# Patient Record
Sex: Female | Born: 1985 | Race: White | Hispanic: No | Marital: Single | Smoking: Former smoker
Health system: Southern US, Academic
[De-identification: ages and names within clinical notes are randomized; demographics above are authoritative.]

## PROBLEM LIST (undated history)

## (undated) ENCOUNTER — Inpatient Hospital Stay: Payer: Self-pay

## (undated) ENCOUNTER — Encounter

## (undated) ENCOUNTER — Telehealth

## (undated) ENCOUNTER — Encounter: Attending: Internal Medicine | Primary: Internal Medicine

## (undated) ENCOUNTER — Ambulatory Visit

## (undated) ENCOUNTER — Ambulatory Visit: Payer: MEDICAID

## (undated) ENCOUNTER — Ambulatory Visit: Attending: Addiction (Substance Use Disorder) | Primary: Addiction (Substance Use Disorder)

## (undated) ENCOUNTER — Telehealth
Attending: Student in an Organized Health Care Education/Training Program | Primary: Student in an Organized Health Care Education/Training Program

## (undated) ENCOUNTER — Ambulatory Visit: Payer: MEDICAID | Attending: Clinical | Primary: Clinical

## (undated) ENCOUNTER — Ambulatory Visit: Payer: MEDICAID | Attending: Internal Medicine | Primary: Internal Medicine

## (undated) ENCOUNTER — Ambulatory Visit: Attending: Clinical | Primary: Clinical

## (undated) DIAGNOSIS — F319 Bipolar disorder, unspecified: Secondary | ICD-10-CM

## (undated) DIAGNOSIS — R87612 Low grade squamous intraepithelial lesion on cytologic smear of cervix (LGSIL): Secondary | ICD-10-CM

## (undated) DIAGNOSIS — F329 Major depressive disorder, single episode, unspecified: Secondary | ICD-10-CM

## (undated) DIAGNOSIS — M419 Scoliosis, unspecified: Secondary | ICD-10-CM

## (undated) DIAGNOSIS — F32A Depression, unspecified: Secondary | ICD-10-CM

## (undated) DIAGNOSIS — F1290 Cannabis use, unspecified, uncomplicated: Secondary | ICD-10-CM

## (undated) DIAGNOSIS — Z8759 Personal history of other complications of pregnancy, childbirth and the puerperium: Secondary | ICD-10-CM

## (undated) DIAGNOSIS — F419 Anxiety disorder, unspecified: Secondary | ICD-10-CM

## (undated) DIAGNOSIS — F17200 Nicotine dependence, unspecified, uncomplicated: Secondary | ICD-10-CM

## (undated) DIAGNOSIS — F1120 Opioid dependence, uncomplicated: Secondary | ICD-10-CM

## (undated) DIAGNOSIS — O0930 Supervision of pregnancy with insufficient antenatal care, unspecified trimester: Secondary | ICD-10-CM

## (undated) HISTORY — DX: Anxiety disorder, unspecified: F41.9

## (undated) NOTE — ED Notes (Signed)
Formatting of this note might be different from the original.  VOL/pending reassessment in the AM  Electronically signed by Haywood Lasso, NT at 12/28/2021  4:45 AM EST

## (undated) NOTE — ED Notes (Signed)
Formatting of this note might be different from the original.  Pt denies SI/HI and understands that the resources listed in her discharge papers are available to help her if needed.  Electronically signed by Cherlynn Polo, RN at 12/28/2021  6:56 AM EST

## (undated) NOTE — Unmapped External Note (Signed)
Formatting of this note is different from the original.  [facility] Face-to-Face Psychiatry Consult     Reason for Consult:  Psych evaluation   Referring Physician:  Dr. Vicente Males  Patient Identification: Dana Meyers  MRN:  [id number]  Principal Diagnosis: History of substance use  Diagnosis:  Principal Problem:    History of substance use    Total Time spent with patient: 45 minutes    Subjective:    " Ive been feeling bad ever since I began taking the Unisom again"    HPI:  Dana Meyers, 81 y.o., female patient seen by this provider; chart reviewed and consulted with Dr. Vicente Males on 12/28/21.  On evaluation Dana Meyers reports  that she has recently stopped taking her oxcabamazapine due to running out of the medication.  She states her provider changed the dosage and that medicaid would not fill it.  She says as a result, she has been finding it difficult to sleep.  Upon further assessing it has been said by the patient that she takes more than what is prescribed and has run out of it.  She also admits to abusing Unisom.  Dana Meyers is an over the counter sleep aid.  The directed amount to take is 1 tablet 30 minutes before bedtime.  She admits to taking 15 tablets daily.  Excess Unisom can lead to seizure, hallucinations, and restlessness.  She also take 8mg  suboxone strips 2x daily.  Patient has an appt with her provider 11/8 and will discuss medication adjustments.      Dana Meyers is anxious, jittery; she is alert/oriented x 4; anxious/cooperative; and mood congruent with affect.  Patient is speaking in a clear tone at moderate volume, and fast pace; with poor eye contact.  Her thought process is coherent and relevant; There is no indication that she is currently responding to internal/external stimuli or experiencing delusional thought content.  Patient denies suicidal/self-harm/homicidal ideation, psychosis, and paranoia.      Past Psychiatric History: substance abuse     Risk to Self:    Risk to Others:    Prior  Inpatient Therapy:    Prior Outpatient Therapy:      Past Medical History:   Past Medical History:   Diagnosis Date    Current smoker     Depression     does not want anyone incl  FOB to know    History of preterm premature rupture of membranes (PPROM)     G2    Low grade squamous intraepithelial lesion (LGSIL) on cervical Pap smear 2017    Marijuana use     Methadone maintenance treatment affecting pregnancy (HCC)     Scoliosis      Past Surgical History:   Procedure Laterality Date    CESAREAN SECTION  09/10/2009    FTP/FITL, meconium    CESAREAN SECTION N/A 05/22/2015    Procedure: CESAREAN SECTION;  Surgeon: Conard Novak, MD;  Location: [facility] ORS;  Service: Obstetrics;  Laterality: N/A;    CESAREAN SECTION N/A 11/29/2018    Procedure: CESAREAN SECTION;  Surgeon: Conard Novak, MD;  Location: [facility] ORS;  Service: Obstetrics;  Laterality: N/A;     Family History:   Family History   Problem Relation Age of Onset    Diabetes Father     Hypertension Father     Heart attack Maternal Grandfather     Cancer Paternal Grandmother     Diabetes Nephew     Heart attack Maternal  Aunt     Heart attack Maternal Uncle      Family Psychiatric  History:   Social History:   Social History     Substance and Sexual Activity   Alcohol Use No    Alcohol/week: 0.0 standard drinks of alcohol     Social History     Substance and Sexual Activity   Drug Use Yes    Types: Marijuana     Social History     Socioeconomic History    Marital status: Single     Spouse name: Not on file    Number of children: Not on file    Years of education: Not on file    Highest education level: Not on file   Occupational History    Not on file   Tobacco Use    Smoking status: Former     Packs/day: 0.50     Types: Cigarettes    Smokeless tobacco: Never   Vaping Use    Vaping Use: Every day   Substance and Sexual Activity    Alcohol use: No     Alcohol/week: 0.0 standard drinks of alcohol    Drug use: Yes     Types: Marijuana    Sexual activity: Yes      Birth control/protection: Implant   Other Topics Concern    Not on file   Social History Narrative    Not on file     Social Determinants of Health     Financial Resource Strain: Not on file   Food Insecurity: Not on file   Transportation Needs: Not on file   Physical Activity: Not on file   Stress: Not on file   Social Connections: Not on file     Additional Social History:      Allergies:  No Known Allergies    Labs:   Results for orders placed or performed during the hospital encounter of 12/28/21 (from the past 48 hour(s))   Acetaminophen level     Status: Abnormal    Collection Time: 12/28/21  1:30 AM   Result Value Ref Range    Acetaminophen (Tylenol), Serum <10 (L) 10 - 30 ug/mL     Comment: (NOTE)  Therapeutic concentrations vary significantly. A range of 10-30 ug/mL   may be an effective concentration for many patients. However, some   are best treated at concentrations outside of this range.  Acetaminophen concentrations >150 ug/mL at 4 hours after ingestion   and >50 ug/mL at 12 hours after ingestion are often associated with   toxic reactions.    Performed at [facility], [address] [place],  [place] [address]    Resp Panel by RT-PCR (Flu A&B, Covid)     Status: None    Collection Time: 12/28/21  1:30 AM   Result Value Ref Range    SARS Coronavirus 2 by RT PCR NEGATIVE NEGATIVE     Comment: (NOTE)  SARS-CoV-2 target nucleic acids are NOT DETECTED.    The SARS-CoV-2 RNA is generally detectable in upper respiratory  specimens during the acute phase of infection. The lowest  concentration of SARS-CoV-2 viral copies this assay can detect is  138 copies/mL. A negative result does not preclude SARS-Cov-2  infection and should not be used as the sole basis for treatment or  other patient management decisions. A negative result may occur with   improper specimen collection/handling, submission of specimen other  than nasopharyngeal swab, presence of viral mutation(s) within the  areas targeted by  this assay, and inadequate number of viral  copies(<138 copies/mL). A negative result must be combined with  clinical observations, patient history, and epidemiological  information. The expected result is Negative.    Fact Sheet for Patients:   BloggerCourse.com    Fact Sheet for Healthcare Providers:   SeriousBroker.it    This test is no t yet approved or cleared by the [place] FDA and   has been authorized for detection and/or diagnosis of SARS-CoV-2 by  FDA under an Emergency Use Authorization (EUA). This EUA will remain   in effect (meaning this test can be used) for the duration of the  COVID-19 declaration under Section 564(b)(1) of the Act, 21  U.S.C.section 360bbb-3(b)(1), unless the authorization is terminated   or revoked sooner.        Influenza A by PCR NEGATIVE NEGATIVE    Influenza B by PCR NEGATIVE NEGATIVE     Comment: (NOTE)  The Xpert Xpress SARS-CoV-2/FLU/RSV plus assay is intended as an aid  in the diagnosis of influenza from Nasopharyngeal swab specimens and  should not be used as a sole basis for treatment. Nasal washings and  aspirates are unacceptable for Xpert Xpress SARS-CoV-2/FLU/RSV  testing.    Fact Sheet for Patients:  BloggerCourse.com    Fact Sheet for Healthcare Providers:  SeriousBroker.it    This test is not yet approved or cleared by the [place] FDA and  has been authorized for detection and/or diagnosis of SARS-CoV-2 by  FDA under an Emergency Use Authorization (EUA). This EUA will remain  in effect (meaning this test can be used) for the duration of the  COVID-19 declaration under Section 564(b)(1) of the Act, 21 U.S.C.  section 360bbb-3(b)(1), unless the authorization is terminated or  revoked.    Performed at [facility], [address] [place],  [place] [address]    CBC with Differential/Platelet     Status: None    Collection Time: 12/28/21  1:30 AM    Result Value Ref Range    WBC 6.0 4.0 - 10.5 K/uL    RBC 4.30 3.87 - 5.11 MIL/uL    Hemoglobin 12.2 12.0 - 15.0 g/dL    HCT 65.7 84.6 - 96.2 %    MCV 85.6 80.0 - 100.0 fL    MCH 28.4 26.0 - 34.0 pg    MCHC 33.2 30.0 - 36.0 g/dL    RDW 95.2 84.1 - 32.4 %    Platelets 228 150 - 400 K/uL    nRBC 0.0 0.0 - 0.2 %    Neutrophils Relative % 54 %    Neutro Abs 3.2 1.7 - 7.7 K/uL    Lymphocytes Relative 33 %    Lymphs Abs 2.0 0.7 - 4.0 K/uL    Monocytes Relative 10 %    Monocytes Absolute 0.6 0.1 - 1.0 K/uL    Eosinophils Relative 2 %    Eosinophils Absolute 0.1 0.0 - 0.5 K/uL    Basophils Relative 1 %    Basophils Absolute 0.0 0.0 - 0.1 K/uL    Immature Granulocytes 0 %    Abs Immature Granulocytes 0.02 0.00 - 0.07 K/uL     Comment: Performed at [facility], [address] [place], [place] [address]   Comprehensive metabolic panel     Status: Abnormal    Collection Time: 12/28/21  1:30 AM   Result Value Ref Range    Sodium 139 135 - 145 mmol/L    Potassium  4.0 3.5 - 5.1 mmol/L    Chloride 109 98 - 111 mmol/L    CO2 23 22 - 32 mmol/L    Glucose, Bld 116 (H) 70 - 99 mg/dL     Comment: Glucose reference range applies only to samples taken after fasting for at least 8 hours.    BUN 8 6 - 20 mg/dL    Creatinine, Ser 4.54 0.44 - 1.00 mg/dL    Calcium 9.3 8.9 - 09.8 mg/dL    Total Protein 7.9 6.5 - 8.1 g/dL    Albumin 4.6 3.5 - 5.0 g/dL    AST 32 15 - 41 U/L    ALT 22 0 - 44 U/L    Alkaline Phosphatase 59 38 - 126 U/L    Total Bilirubin 0.3 0.3 - 1.2 mg/dL    GFR, Estimated >11 >91 mL/min     Comment: (NOTE)  Calculated using the CKD-EPI Creatinine Equation (2021)     Anion gap 7 5 - 15     Comment: Performed at [facility], [address] [place], [place] [address]   Salicylate level     Status: Abnormal    Collection Time: 12/28/21  1:30 AM   Result Value Ref Range    Salicylate Lvl <7.0 (L) 7.0 - 30.0 mg/dL     Comment: Performed at [facility], [address], [place], [place] [address]    Urine Drug Screen, Qualitative ([facility] only)     Status: Abnormal    Collection Time: 12/28/21  1:30 AM   Result Value Ref Range    Tricyclic, Ur Screen NONE DETECTED NONE DETECTED    Amphetamines, Ur Screen NONE DETECTED NONE DETECTED    MDMA (Ecstasy)Ur Screen NONE DETECTED NONE DETECTED    Cocaine Metabolite,Ur Sc NONE DETECTED NONE DETECTED    Opiate, Ur Screen NONE DETECTED NONE DETECTED    Phencyclidine (PCP) Ur S NONE DETECTED NONE DETECTED    Cannabinoid 50 Ng, Ur Sc POSITIVE (A) NONE DETECTED    Barbiturates, Ur Screen NONE DETECTED NONE DETECTED    Benzodiazepine, Ur Scrn NONE DETECTED NONE DETECTED    Methadone Scn, Ur NONE DETECTED NONE DETECTED     Comment: (NOTE)  Tricyclics + metabolites, urine    Cutoff 1000 ng/mL  Amphetamines + metabolites, urine  Cutoff 1000 ng/mL  MDMA (Ecstasy), urine              Cutoff 500 ng/mL  Cocaine Metabolite, urine          Cutoff 300 ng/mL  Opiate + metabolites, urine        Cutoff 300 ng/mL  Phencyclidine (PCP), urine         Cutoff 25 ng/mL  Cannabinoid, urine                 Cutoff 50 ng/mL  Barbiturates + metabolites, urine  Cutoff 200 ng/mL  Benzodiazepine, urine              Cutoff 200 ng/mL  Methadone, urine                   Cutoff 300 ng/mL    The urine drug screen provides only a preliminary, unconfirmed  analytical test result and should not be used for non-medical  purposes. Clinical consideration and professional judgment should  be applied to any positive drug screen result due to possible  interfering substances. A more specific alternate chemical method  must be used in order to obtain  a confirmed analytical result.  Gas chromatography / mass spectrometry (GC/MS) is the preferred  confirm atory method.  Performed at [facility], [address] [place],  [place] [address]    Ethanol     Status: None    Collection Time: 12/28/21  1:30 AM   Result Value Ref Range    Alcohol, Ethyl (B) <10 <10 mg/dL     Comment: (NOTE)  Lowest detectable limit  for serum alcohol is 10 mg/dL.    For medical purposes only.  Performed at [facility], [address] [place],  [place] [address]    Pregnancy, urine     Status: None    Collection Time: 12/28/21  1:30 AM   Result Value Ref Range    Preg Test, Ur NEGATIVE NEGATIVE     Comment: Performed at [facility], [address] [place], [place] [address]     No current facility-administered medications for this encounter.     Current Outpatient Medications   Medication Sig Dispense Refill    Buprenorphine HCl-Naloxone HCl (SUBOXONE) 8-2 MG FILM Place 1 Film under the tongue in the morning and at bedtime. 30 each     clotrimazole (MYCELEX) 10 MG troche Take 1 tablet (10 mg total) by mouth 5 (five) times daily. (Patient not taking: Reported on 12/28/2021) 35 tablet 0    cyanocobalamin (VITAMIN B12) 1000 MCG tablet Take 1 tablet by mouth daily. (Patient not taking: Reported on 12/28/2021)      hydrOXYzine (ATARAX) 25 MG tablet Take 25 mg by mouth every 8 (eight) hours as needed.      Oxcarbazepine (TRILEPTAL) 300 MG tablet Take 300 mg by mouth daily. (Patient not taking: Reported on 12/28/2021)       Musculoskeletal:  Strength & Muscle Tone: within normal limits  Gait & Station: normal  Patient leans: N/A    Psychiatric Specialty Exam:    Presentation   General Appearance:   Casual    Eye Contact:  Fair    Speech:  Clear and Coherent    Speech Volume:  Decreased    Handedness:  Right    Mood and Affect   Mood:  Anxious; Irritable; Depressed    Affect:  Appropriate; Depressed    Thought Process   Thought Processes:  Coherent    Descriptions of Associations:Intact    Orientation:Full (Time, Place and Person)    Thought Content:Logical    History of Schizophrenia/Schizoaffective disorder:No data recorded  Duration of Psychotic Symptoms:No data recorded  Hallucinations:Hallucinations: None    Ideas of Reference:None    Suicidal Thoughts:Suicidal Thoughts: No    Homicidal Thoughts:Homicidal Thoughts:  No    Sensorium   Memory:  Immediate Fair    Judgment:  Fair    Insight:  Fair    Art therapist   Concentration:  Fair    Attention Span:  Fair    Recall:  Fair    Fund of Knowledge:  Fair    Language:  Fair    Psychomotor Activity   Psychomotor Activity:Psychomotor Activity: Normal    Assets   Assets:  Communication Skills; Desire for Improvement; Housing; Social Support    Sleep   Sleep:Sleep: Poor    Physical Exam:  Physical Exam  Vitals and nursing note reviewed.   HENT:      Head: Normocephalic and atraumatic.      Nose: Nose normal.      Mouth/Throat:      Mouth: Mucous membranes are dry.  Eyes:      Extraocular Movements: Extraocular movements intact.      Pupils: Pupils are equal, round, and reactive to light.   Pulmonary:      Effort: Pulmonary effort is normal.   Musculoskeletal:         General: Normal range of motion.      Cervical back: Normal range of motion.   Skin:     General: Skin is dry.   Neurological:      Mental Status: She is alert and oriented to person, place, and time.   Psychiatric:         Attention and Perception: Attention and perception normal.         Mood and Affect: Mood is anxious and depressed.         Speech: Speech normal.         Behavior: Behavior is cooperative.         Thought Content: Thought content does not include suicidal plan.         Cognition and Memory: Cognition and memory normal.         Judgment: Judgment is impulsive.     ROS  unknown if currently breastfeeding. There is no height or weight on file to calculate BMI.    Disposition: Patient does not meet criteria for psychiatric inpatient admission.  Supportive therapy provided about ongoing stressors.  Refer to IOP.  Discussed crisis plan, support from social network, calling 911, coming to the Emergency Department, and calling Suicide Hotline.  Reassess in the AM    Jearld Lesch, NP  12/28/2021 3:18 AM    Electronically signed by Audery Amel, MD at 12/28/2021  1:32 PM EST

## (undated) NOTE — ED Notes (Signed)
Formatting of this note might be different from the original.  Initial triage and vitals charting done on paper and to be scanned in due to system downtime.    Electronically signed by Cherlynn Polo, RN at 12/28/2021  2:08 AM EST

## (undated) NOTE — ED Provider Notes (Signed)
Formatting of this note is different from the original.  Images from the original note were not included.      [facility]  Provider Note     Event Date/Time    First MD Initiated Contact with Patient 12/28/21 [phone]      (approximate)  History   No chief complaint on file.    HPI  Dana Meyers is a 55 y.o. female with past medical history of depression and polysubstance abuse who presents complaining of suicidal ideation and auditory hallucinations.  Patient states that she has stopped her regularly prescribed medications due to insurance/financial issues and/or patient feeling as if she does not need them anymore.  Patient states that she has had suicidal ideation without a definitive plan and states that her auditory hallucinations are not commanding her to do anything to herself but are overall negative.  Patient currently denies any homicidal ideation, suicidal ideation, or visual hallucinations  ROS: Patient currently denies any vision changes, tinnitus, difficulty speaking, facial droop, sore throat, chest pain, shortness of breath, abdominal pain, nausea/vomiting/diarrhea, dysuria, or weakness/numbness/paresthesias in any extremity    Physical Exam   Triage Vital Signs:  ED Triage Vitals [12/28/21 0213]   Enc Vitals Group      BP       Pulse       Resp       Temp       Temp src       SpO2       Weight       Height       Head Circumference       Peak Flow       Pain Score 0      Pain Loc       Pain Edu?       Excl. in GC?      Most recent vital signs:  There were no vitals filed for this visit.  General: Awake, oriented x4.  CV:  Good peripheral perfusion.   Resp:  Normal effort.   Abd:  No distention.   Other:  Middle-aged Caucasian female laying in bed in no acute distress.  Not responding to internal stimuli  ED Results / Procedures / Treatments   Labs  (all labs ordered are listed, but only abnormal results are displayed)  Labs Reviewed   RESP PANEL BY RT-PCR (FLU A&B, COVID) [id number]    CBC WITH DIFFERENTIAL/PLATELET   ACETAMINOPHEN LEVEL   COMPREHENSIVE METABOLIC PANEL   SALICYLATE LEVEL   URINE DRUG SCREEN, QUALITATIVE ([facility] ONLY)   POC URINE PREG, ED     PROCEDURES:  Critical Care performed: No  Procedures  MEDICATIONS ORDERED IN ED:  Medications - No data to display  IMPRESSION / MDM / ASSESSMENT AND PLAN / ED COURSE   I reviewed the triage vital signs and the nursing notes.      Patient's presentation is most consistent with acute presentation with potential threat to life or bodily function.  Patient presents under IVC for hallucinations/delusions. Thoughts are disorganized.  No history of prior suicide attempt, and no SI or HI at this time.  Clinically w/ no overt toxidrome, low suspicion for ingestion given hx and exam Thoughts unlikely 2/2 anemia, hypothyroidism, infection, or ICH.  Patients decision making capacity is compromised and they are unable to perform all ADLs (additionally they are without appropriate caretakers to assist through this deficit).    Consult: Psychiatry to evaluate patient for grave disability  Disposition: Pending psychiatric evaluation    Patient was evaluated by psychiatry prior to discharge and was not found to be a candidate for inpatient admission at this time.  Prior to reassessment in the morning, patient desired to be discharged and follow-up as an outpatient in the psychiatric clinics.  Patient given resources for follow-up prior to discharge and all questions were answered.    Dispo: Discharge home    FINAL CLINICAL IMPRESSION(S) / ED DIAGNOSES     Final diagnoses:   Suicidal ideation   Auditory hallucinations     Rx / DC Orders     ED Discharge Orders       None         Note:  This document was prepared using Dragon voice recognition software and may include unintentional dictation errors.    Merwyn Katos, MD  12/28/21 [phone]    Electronically signed by Merwyn Katos, MD at 12/28/2021  7:12 AM EST

## (undated) NOTE — ED Notes (Signed)
Formatting of this note might be different from the original.  Pt tearful upon assessment stating that she has been having auditory hallucinations stating negative things to her about herself (non command.) States that she had SI earlier today without a plan. Denies current SI/HI. States that she has been getting scared of her fiance b/c he gets angry and does not understand what she's going through and this is causing panic attacks.  Pt currently taking suboxone.  Recently stopped taking Hep-C med 'mavyret' d/t interaction with oxcarbazapine which she has recently stopped taking d/t insurance issue.  Also recently stopped taking prozac b/c she 'felt better.'    Electronically signed by Cherlynn Polo, RN at 12/28/2021  2:25 AM EST

## (undated) NOTE — ED Notes (Signed)
Formatting of this note might be different from the original.  Pt given sandwich tray and drink per her request.  Electronically signed by Cherlynn Polo, RN at 12/28/2021  2:13 AM EST

---

## 2007-11-20 ENCOUNTER — Emergency Department: Payer: Self-pay | Admitting: Emergency Medicine

## 2007-11-21 ENCOUNTER — Ambulatory Visit: Payer: Self-pay | Admitting: Emergency Medicine

## 2008-06-07 ENCOUNTER — Emergency Department: Payer: Self-pay | Admitting: Emergency Medicine

## 2008-06-13 ENCOUNTER — Emergency Department: Payer: Self-pay | Admitting: Emergency Medicine

## 2008-09-27 ENCOUNTER — Emergency Department: Payer: Self-pay | Admitting: Emergency Medicine

## 2008-12-15 ENCOUNTER — Emergency Department: Payer: Self-pay | Admitting: Emergency Medicine

## 2009-08-07 ENCOUNTER — Observation Stay: Payer: Self-pay

## 2009-09-10 ENCOUNTER — Inpatient Hospital Stay: Payer: Self-pay

## 2009-09-13 LAB — PATHOLOGY REPORT

## 2009-10-31 ENCOUNTER — Emergency Department: Payer: Self-pay | Admitting: Emergency Medicine

## 2009-11-07 ENCOUNTER — Emergency Department: Payer: Self-pay | Admitting: Emergency Medicine

## 2009-12-14 ENCOUNTER — Emergency Department: Payer: Self-pay | Admitting: Emergency Medicine

## 2011-01-11 ENCOUNTER — Emergency Department: Payer: Self-pay | Admitting: Internal Medicine

## 2011-02-03 ENCOUNTER — Emergency Department: Payer: Self-pay | Admitting: Emergency Medicine

## 2011-03-28 ENCOUNTER — Emergency Department: Payer: Self-pay | Admitting: Emergency Medicine

## 2011-03-30 LAB — THROAT CULTURE

## 2011-11-22 ENCOUNTER — Emergency Department: Payer: Self-pay | Admitting: Emergency Medicine

## 2012-09-08 ENCOUNTER — Emergency Department: Payer: Self-pay | Admitting: Emergency Medicine

## 2012-11-08 ENCOUNTER — Emergency Department: Payer: Self-pay | Admitting: Emergency Medicine

## 2012-11-08 LAB — URINALYSIS, COMPLETE
Bacteria: NONE SEEN
Bilirubin,UR: NEGATIVE
Nitrite: NEGATIVE
Ph: 6 (ref 4.5–8.0)
Protein: NEGATIVE
Squamous Epithelial: 1
WBC UR: 1 /HPF (ref 0–5)

## 2012-11-08 LAB — CBC
HGB: 15.3 g/dL (ref 12.0–16.0)
MCH: 30.4 pg (ref 26.0–34.0)
MCHC: 33.7 g/dL (ref 32.0–36.0)
MCV: 90 fL (ref 80–100)
RBC: 5.02 10*6/uL (ref 3.80–5.20)
WBC: 12.6 10*3/uL — ABNORMAL HIGH (ref 3.6–11.0)

## 2012-11-08 LAB — COMPREHENSIVE METABOLIC PANEL
Albumin: 4.7 g/dL (ref 3.4–5.0)
Anion Gap: 6 — ABNORMAL LOW (ref 7–16)
BUN: 7 mg/dL (ref 7–18)
Bilirubin,Total: 0.4 mg/dL (ref 0.2–1.0)
Calcium, Total: 9.8 mg/dL (ref 8.5–10.1)
Chloride: 105 mmol/L (ref 98–107)
Co2: 26 mmol/L (ref 21–32)
Creatinine: 0.67 mg/dL (ref 0.60–1.30)
EGFR (African American): >60
EGFR (Non-African Amer.): >60
Glucose: 102 mg/dL — ABNORMAL HIGH (ref 65–99)
Osmolality: 272 (ref 275–301)
Potassium: 4.1 mmol/L (ref 3.5–5.1)
SGOT(AST): 18 U/L (ref 15–37)
Total Protein: 8.2 g/dL (ref 6.4–8.2)

## 2012-11-08 LAB — PREGNANCY, URINE: Pregnancy Test, Urine: NEGATIVE m[IU]/mL

## 2013-07-05 ENCOUNTER — Emergency Department: Payer: Self-pay | Admitting: Emergency Medicine

## 2013-08-08 ENCOUNTER — Emergency Department: Payer: Self-pay | Admitting: Emergency Medicine

## 2013-10-01 ENCOUNTER — Emergency Department: Payer: Self-pay | Admitting: Emergency Medicine

## 2013-10-01 LAB — URINALYSIS, COMPLETE
Bacteria: NONE SEEN
Bilirubin,UR: NEGATIVE
Blood: NEGATIVE
GLUCOSE, UR: NEGATIVE mg/dL (ref 0–75)
LEUKOCYTE ESTERASE: NEGATIVE
NITRITE: NEGATIVE
Ph: 5 (ref 4.5–8.0)
Protein: 30
RBC,UR: 3 /HPF (ref 0–5)
Specific Gravity: 1.031 (ref 1.003–1.030)
Squamous Epithelial: 3
WBC UR: <1 /HPF (ref 0–5)

## 2013-10-02 LAB — GC/CHLAMYDIA PROBE AMP

## 2013-10-02 LAB — WET PREP, GENITAL

## 2013-12-08 ENCOUNTER — Emergency Department: Payer: Self-pay | Admitting: Emergency Medicine

## 2014-02-02 ENCOUNTER — Emergency Department: Payer: Self-pay | Admitting: Emergency Medicine

## 2015-02-24 DIAGNOSIS — R87612 Low grade squamous intraepithelial lesion on cytologic smear of cervix (LGSIL): Secondary | ICD-10-CM | POA: Diagnosis present

## 2015-02-24 HISTORY — DX: Low grade squamous intraepithelial lesion on cytologic smear of cervix (LGSIL): R87.612

## 2015-03-12 ENCOUNTER — Observation Stay
Admission: EM | Admit: 2015-03-12 | Discharge: 2015-03-12 | Disposition: A | Discharge status: Home / Self Care | Payer: Medicaid Other | Attending: Obstetrics & Gynecology | Admitting: Obstetrics & Gynecology

## 2015-03-12 DIAGNOSIS — O26892 Other specified pregnancy related conditions, second trimester: Secondary | ICD-10-CM | POA: Diagnosis not present

## 2015-03-12 DIAGNOSIS — R109 Unspecified abdominal pain: Secondary | ICD-10-CM | POA: Diagnosis present

## 2015-03-12 DIAGNOSIS — O479 False labor, unspecified: Secondary | ICD-10-CM | POA: Diagnosis present

## 2015-03-12 DIAGNOSIS — O2342 Unspecified infection of urinary tract in pregnancy, second trimester: Principal | ICD-10-CM | POA: Insufficient documentation

## 2015-03-12 DIAGNOSIS — Z3A22 22 weeks gestation of pregnancy: Secondary | ICD-10-CM | POA: Insufficient documentation

## 2015-03-12 LAB — COMPREHENSIVE METABOLIC PANEL
ALBUMIN: 3.9 g/dL (ref 3.5–5.0)
ALT: 39 U/L (ref 14–54)
ANION GAP: 9 (ref 5–15)
AST: 42 U/L — ABNORMAL HIGH (ref 15–41)
Alkaline Phosphatase: 103 U/L (ref 38–126)
BILIRUBIN TOTAL: 0.8 mg/dL (ref 0.3–1.2)
BUN: 10 mg/dL (ref 6–20)
CO2: 22 mmol/L (ref 22–32)
Calcium: 9.4 mg/dL (ref 8.9–10.3)
Chloride: 104 mmol/L (ref 101–111)
Creatinine, Ser: 0.59 mg/dL (ref 0.44–1.00)
GFR calc Af Amer: >60 mL/min (ref >60)
GLUCOSE: 72 mg/dL (ref 65–99)
POTASSIUM: 3.8 mmol/L (ref 3.5–5.1)
Sodium: 135 mmol/L (ref 135–145)
TOTAL PROTEIN: 7.5 g/dL (ref 6.5–8.1)

## 2015-03-12 LAB — CBC
HEMATOCRIT: 37 % (ref 35.0–47.0)
Hemoglobin: 12.6 g/dL (ref 12.0–16.0)
MCH: 30 pg (ref 26.0–34.0)
MCHC: 34 g/dL (ref 32.0–36.0)
MCV: 88.2 fL (ref 80.0–100.0)
Platelets: 209 10*3/uL (ref 150–440)
RBC: 4.2 MIL/uL (ref 3.80–5.20)
RDW: 13.2 % (ref 11.5–14.5)
WBC: 20.6 10*3/uL — ABNORMAL HIGH (ref 3.6–11.0)

## 2015-03-12 LAB — OB RESULTS CONSOLE HEPATITIS B SURFACE ANTIGEN: HEP B S AG: NEGATIVE

## 2015-03-12 LAB — URINALYSIS COMPLETE WITH MICROSCOPIC (ARMC ONLY)
BACTERIA UA: NONE SEEN
Bilirubin Urine: NEGATIVE
Glucose, UA: NEGATIVE mg/dL
Hgb urine dipstick: NEGATIVE
Nitrite: NEGATIVE
PH: 5 (ref 5.0–8.0)
PROTEIN: 30 mg/dL — AB
Specific Gravity, Urine: 1.033 — ABNORMAL HIGH (ref 1.005–1.030)

## 2015-03-12 LAB — CHLAMYDIA/NGC RT PCR (ARMC ONLY)
Chlamydia Tr: NOT DETECTED
N GONORRHOEAE: NOT DETECTED

## 2015-03-12 LAB — RAPID HIV SCREEN (HIV 1/2 AB+AG)
HIV 1/2 ANTIBODIES: NONREACTIVE
HIV-1 P24 ANTIGEN - HIV24: NONREACTIVE

## 2015-03-12 LAB — OB RESULTS CONSOLE RUBELLA ANTIBODY, IGM: RUBELLA: IMMUNE

## 2015-03-12 LAB — OB RESULTS CONSOLE RPR: RPR: NONREACTIVE

## 2015-03-12 MED ORDER — NITROFURANTOIN MONOHYD MACRO 100 MG PO CAPS
100.0000 mg | ORAL_CAPSULE | Freq: Two times a day (BID) | ORAL | Status: DC
  Administered 2015-03-12: 100 mg via ORAL

## 2015-03-12 MED ORDER — ONDANSETRON HCL 4 MG/2ML IJ SOLN: 4.0000 mg | Freq: Four times a day (QID) | INTRAMUSCULAR | Status: DC | PRN

## 2015-03-12 MED ORDER — NITROFURANTOIN MONOHYD MACRO 100 MG PO CAPS
ORAL_CAPSULE | ORAL | Status: AC
  Administered 2015-03-12: 100 mg via ORAL
  Filled 2015-03-12: qty 1

## 2015-03-12 MED ORDER — ACETAMINOPHEN 325 MG PO TABS
650.0000 mg | ORAL_TABLET | ORAL | Status: DC | PRN
  Administered 2015-03-12: 650 mg via ORAL
  Filled 2015-03-12: qty 2

## 2015-03-12 NOTE — Final Progress Note (Signed)
    OB Discharge Summary     Patient Name: Annette Roberts DOB: 1986/01/22 MRN: [id number]  Date of admission: 03/12/2015 Delivering MD: This patient has no babies on file.  Date of discharge: 03/12/2015  Admitting diagnosis: 25 weeks?  abd pain Intrauterine pregnancy: [redacted]w[redacted]d     Secondary diagnosis:  Active Problems:   Irregular contractions  Additional problems: UTI     Discharge diagnosis: UTI                                   Hospital course:  [facility]   FHR normal UA pos infection  Physical exam  Filed Vitals:   03/12/15 1936  BP: 130/81  Pulse: 82  Temp: 97.9 F (36.6 C)  TempSrc: Oral  Resp: 18   Labs: Lab Results  Component Value Date   WBC 20.6* 03/12/2015   HGB 12.6 03/12/2015   HCT 37.0 03/12/2015   MCV 88.2 03/12/2015   PLT 209 03/12/2015   CMP Latest Ref Rng 11/08/2012  Glucose 65-99 mg/dL 409(W)  BUN 1-19 mg/dL 7  Creatinine 1.47-8.29 mg/dL 5.62  Sodium 130-865 mmol/L 137  Potassium 3.5-5.1 mmol/L 4.1  Chloride 98-107 mmol/L 105  CO2 21-32 mmol/L 26  Calcium 8.5-10.1 mg/dL 9.8  Total Protein 7.8-4.6 g/dL 8.2  Total Bilirubin 9.6-2.9 mg/dL 0.4  Alkaline Phos 52-841 Unit/L 92  AST 15-37 Unit/L 18  ALT 12-78 U/L 19    Discharge instruction: per After Visit Summary and "Baby and Me Booklet".  After visit meds:    Medication List    Notice    You have not been prescribed any medications.     Macrobid for uti  Diet: reg  Activity: Advance as tolerated.  Follow up Appt:this week   03/12/2015 Letitia Libra, MD

## 2015-03-12 NOTE — H&P (Signed)
Obstetrics Admission History & Physical   Abdominal Pain   HPI:  31 y.o. G2P1 at 22 weeks w no PNC, has appt later this week per pt at [facility].. Admitted on 03/12/2015:  Presents for abdominal pain.  Prenatal care at: No prenatal care  PMHx: No past medical history on file. PSHx: CS. Medications:  No prescriptions prior to admission   Allergies: has no allergies on file. OBHx:  OB History  No data available   ZOX:[id number] except as detailed in HPI. Soc Hx: Tob. No PNC.  Objective:  General: Well nourished, well developed female in no acute distress.  Skin: Warm and dry.  Cardiovascular:Regular rate and rhythm. Respiratory: Clear to auscultation bilateral. Normal respiratory effort Abdomen: mild Neuro/Psych: Normal mood and affect.  EFM:FHR: 140 bpm, Toco: None    Assessment & Plan:   30 y.o. No obstetric history on file. Admitted on 03/12/2015:Pain.  22 weeks.  No PNC.    UA, Labs, Monitor

## 2015-03-12 NOTE — OB Triage Note (Signed)
Pt here with c/o back and bilateral round ligament pain which started 5 days ago. No PNC yet, EDC based on pt report of LMP. Previous C/S 5 years ago, smokes 1/2-1 PPD. Denies drug, alcohol use, bleeding, LOF.

## 2015-03-12 NOTE — Discharge Instructions (Signed)

## 2015-03-13 LAB — ABO/RH: ABO/RH(D): O POS

## 2015-03-14 LAB — RUBELLA SCREEN: RUBELLA: 1.49 {index} (ref >0.99)

## 2015-03-14 LAB — RPR: RPR: NONREACTIVE

## 2015-03-14 LAB — OB RESULTS CONSOLE GC/CHLAMYDIA
Chlamydia: NEGATIVE
GC PROBE AMP, GENITAL: NEGATIVE

## 2015-03-14 LAB — HEPATITIS B SURFACE ANTIGEN: HEP B S AG: NEGATIVE

## 2015-03-14 LAB — OB RESULTS CONSOLE VARICELLA ZOSTER ANTIBODY, IGG: VARICELLA IGG: IMMUNE

## 2015-05-22 ENCOUNTER — Inpatient Hospital Stay: Payer: Medicaid Other | Admitting: Anesthesiology

## 2015-05-22 ENCOUNTER — Encounter
Admission: EM | Disposition: A | Discharge status: Home / Self Care | Payer: Self-pay | Source: Home / Self Care | Attending: Obstetrics and Gynecology

## 2015-05-22 ENCOUNTER — Inpatient Hospital Stay
Admission: EM | Admit: 2015-05-22 | Discharge: 2015-05-26 | DRG: 765 | Disposition: A | Discharge status: Home / Self Care | Payer: Medicaid Other | Attending: Obstetrics and Gynecology | Admitting: Obstetrics and Gynecology

## 2015-05-22 DIAGNOSIS — Z3A33 33 weeks gestation of pregnancy: Secondary | ICD-10-CM | POA: Diagnosis not present

## 2015-05-22 DIAGNOSIS — O411230 Chorioamnionitis, third trimester, not applicable or unspecified: Secondary | ICD-10-CM | POA: Diagnosis present

## 2015-05-22 DIAGNOSIS — R058 Other specified cough: Secondary | ICD-10-CM

## 2015-05-22 DIAGNOSIS — F17200 Nicotine dependence, unspecified, uncomplicated: Secondary | ICD-10-CM | POA: Diagnosis present

## 2015-05-22 DIAGNOSIS — F1290 Cannabis use, unspecified, uncomplicated: Secondary | ICD-10-CM | POA: Diagnosis present

## 2015-05-22 DIAGNOSIS — O42913 Preterm premature rupture of membranes, unspecified as to length of time between rupture and onset of labor, third trimester: Secondary | ICD-10-CM | POA: Diagnosis present

## 2015-05-22 DIAGNOSIS — O9952 Diseases of the respiratory system complicating childbirth: Secondary | ICD-10-CM | POA: Diagnosis present

## 2015-05-22 DIAGNOSIS — R059 Cough, unspecified: Secondary | ICD-10-CM

## 2015-05-22 DIAGNOSIS — R590 Localized enlarged lymph nodes: Secondary | ICD-10-CM | POA: Diagnosis present

## 2015-05-22 DIAGNOSIS — O0933 Supervision of pregnancy with insufficient antenatal care, third trimester: Secondary | ICD-10-CM

## 2015-05-22 DIAGNOSIS — D62 Acute posthemorrhagic anemia: Secondary | ICD-10-CM | POA: Diagnosis not present

## 2015-05-22 DIAGNOSIS — O34211 Maternal care for low transverse scar from previous cesarean delivery: Secondary | ICD-10-CM | POA: Diagnosis present

## 2015-05-22 DIAGNOSIS — R0989 Other specified symptoms and signs involving the circulatory and respiratory systems: Secondary | ICD-10-CM

## 2015-05-22 DIAGNOSIS — F17210 Nicotine dependence, cigarettes, uncomplicated: Secondary | ICD-10-CM | POA: Diagnosis present

## 2015-05-22 DIAGNOSIS — J40 Bronchitis, not specified as acute or chronic: Secondary | ICD-10-CM | POA: Diagnosis present

## 2015-05-22 DIAGNOSIS — R05 Cough: Secondary | ICD-10-CM

## 2015-05-22 DIAGNOSIS — O99324 Drug use complicating childbirth: Secondary | ICD-10-CM | POA: Diagnosis present

## 2015-05-22 DIAGNOSIS — O99334 Smoking (tobacco) complicating childbirth: Secondary | ICD-10-CM | POA: Diagnosis present

## 2015-05-22 HISTORY — DX: Low grade squamous intraepithelial lesion on cytologic smear of cervix (LGSIL): R87.612

## 2015-05-22 HISTORY — DX: Depression, unspecified: F32.A

## 2015-05-22 HISTORY — DX: Supervision of pregnancy with insufficient antenatal care, unspecified trimester: O09.30

## 2015-05-22 HISTORY — DX: Scoliosis, unspecified: M41.9

## 2015-05-22 HISTORY — DX: Nicotine dependence, unspecified, uncomplicated: F17.200

## 2015-05-22 HISTORY — DX: Major depressive disorder, single episode, unspecified: F32.9

## 2015-05-22 HISTORY — DX: Cannabis use, unspecified, uncomplicated: F12.90

## 2015-05-22 LAB — URINE DRUG SCREEN, QUALITATIVE (ARMC ONLY)
AMPHETAMINES, UR SCREEN: NOT DETECTED
BARBITURATES, UR SCREEN: NOT DETECTED
BENZODIAZEPINE, UR SCRN: NOT DETECTED
CANNABINOID 50 NG, UR ~~LOC~~: NOT DETECTED
Cocaine Metabolite,Ur ~~LOC~~: NOT DETECTED
MDMA (Ecstasy)Ur Screen: NOT DETECTED
Methadone Scn, Ur: POSITIVE — AB
OPIATE, UR SCREEN: POSITIVE — AB
PHENCYCLIDINE (PCP) UR S: NOT DETECTED
TRICYCLIC, UR SCREEN: NOT DETECTED

## 2015-05-22 LAB — RAPID HIV SCREEN (HIV 1/2 AB+AG)
HIV 1/2 Antibodies: NONREACTIVE
HIV-1 P24 ANTIGEN - HIV24: NONREACTIVE

## 2015-05-22 LAB — TYPE AND SCREEN
ABO/RH(D): O POS
Antibody Screen: NEGATIVE

## 2015-05-22 LAB — COMPREHENSIVE METABOLIC PANEL
ALT: 32 U/L (ref 14–54)
ANION GAP: 6 (ref 5–15)
AST: 44 U/L — ABNORMAL HIGH (ref 15–41)
Albumin: 2.6 g/dL — ABNORMAL LOW (ref 3.5–5.0)
Alkaline Phosphatase: 211 U/L — ABNORMAL HIGH (ref 38–126)
BUN: 9 mg/dL (ref 6–20)
CALCIUM: 7.9 mg/dL — AB (ref 8.9–10.3)
CHLORIDE: 108 mmol/L (ref 101–111)
CO2: 21 mmol/L — AB (ref 22–32)
Creatinine, Ser: 0.49 mg/dL (ref 0.44–1.00)
GFR calc non Af Amer: >60 mL/min (ref >60)
Glucose, Bld: 101 mg/dL — ABNORMAL HIGH (ref 65–99)
Potassium: 3.7 mmol/L (ref 3.5–5.1)
SODIUM: 135 mmol/L (ref 135–145)
TOTAL PROTEIN: 5.6 g/dL — AB (ref 6.5–8.1)
Total Bilirubin: 0.6 mg/dL (ref 0.3–1.2)

## 2015-05-22 LAB — CBC
HEMATOCRIT: 33.8 % — AB (ref 35.0–47.0)
Hemoglobin: 11.7 g/dL — ABNORMAL LOW (ref 12.0–16.0)
MCH: 30.9 pg (ref 26.0–34.0)
MCHC: 34.6 g/dL (ref 32.0–36.0)
MCV: 89.3 fL (ref 80.0–100.0)
Platelets: 174 10*3/uL (ref 150–440)
RBC: 3.78 MIL/uL — AB (ref 3.80–5.20)
RDW: 13.1 % (ref 11.5–14.5)
WBC: 16.7 10*3/uL — AB (ref 3.6–11.0)

## 2015-05-22 LAB — CHLAMYDIA/NGC RT PCR (ARMC ONLY)
Chlamydia Tr: NOT DETECTED
N GONORRHOEAE: NOT DETECTED

## 2015-05-22 LAB — PROTEIN / CREATININE RATIO, URINE
CREATININE, URINE: 89 mg/dL
Protein Creatinine Ratio: 0.15 mg/mg{Cre} (ref 0.00–0.15)
Total Protein, Urine: 13 mg/dL

## 2015-05-22 SURGERY — Surgical Case
Anesthesia: Spinal | Site: Abdomen | Wound class: Clean Contaminated

## 2015-05-22 MED ORDER — OXYTOCIN 10 UNIT/ML IJ SOLN: 10.0000 [IU] | Freq: Once | INTRAMUSCULAR | Status: DC

## 2015-05-22 MED ORDER — LIDOCAINE HCL (PF) 1 % IJ SOLN: 30.0000 mL | INTRAMUSCULAR | Status: DC | PRN

## 2015-05-22 MED ORDER — CITRIC ACID-SODIUM CITRATE 334-500 MG/5ML PO SOLN
30.0000 mL | ORAL | Status: DC | PRN
  Filled 2015-05-22: qty 15

## 2015-05-22 MED ORDER — CITRIC ACID-SODIUM CITRATE 334-500 MG/5ML PO SOLN
30.0000 mL | ORAL | Status: AC
  Administered 2015-05-22: 30 mL via ORAL

## 2015-05-22 MED ORDER — LACTATED RINGERS IV SOLN
500.0000 mL | INTRAVENOUS | Status: DC | PRN
  Administered 2015-05-22: 1000 mL via INTRAVENOUS

## 2015-05-22 MED ORDER — BUPIVACAINE 0.25 % ON-Q PUMP DUAL CATH 400 ML: 400.0000 mL | INJECTION | Status: DC

## 2015-05-22 MED ORDER — SODIUM CHLORIDE 0.9 % IV SOLN
2.0000 g | Freq: Four times a day (QID) | INTRAVENOUS | Status: DC
  Administered 2015-05-22: 2 g via INTRAVENOUS
  Filled 2015-05-22 (×2): qty 2000

## 2015-05-22 MED ORDER — OXYTOCIN BOLUS FROM INFUSION: 500.0000 mL | INTRAVENOUS | Status: DC

## 2015-05-22 MED ORDER — SODIUM CHLORIDE 0.9 % IV SOLN
250.0000 mg | Freq: Four times a day (QID) | INTRAVENOUS | Status: DC
  Administered 2015-05-22: 250 mg via INTRAVENOUS
  Filled 2015-05-22 (×4): qty 5

## 2015-05-22 MED ORDER — BUPIVACAINE HCL (PF) 0.5 % IJ SOLN
5.0000 mL | Freq: Once | INTRAMUSCULAR | Status: DC
  Filled 2015-05-22: qty 30

## 2015-05-22 MED ORDER — BUPIVACAINE HCL (PF) 0.5 % IJ SOLN
5.0000 mL | Freq: Once | INTRAMUSCULAR | Status: DC
  Filled 2015-05-22: qty 60

## 2015-05-22 MED ORDER — FENTANYL CITRATE (PF) 100 MCG/2ML IJ SOLN
INTRAMUSCULAR | Status: AC
  Filled 2015-05-22: qty 2

## 2015-05-22 MED ORDER — LACTATED RINGERS IV SOLN
INTRAVENOUS | Status: DC
  Administered 2015-05-22 (×2): via INTRAVENOUS

## 2015-05-22 MED ORDER — CEFAZOLIN SODIUM-DEXTROSE 2-4 GM/100ML-% IV SOLN
2.0000 g | INTRAVENOUS | Status: AC
  Administered 2015-05-23: 2 g via INTRAVENOUS

## 2015-05-22 MED ORDER — OXYTOCIN 40 UNITS IN LACTATED RINGERS INFUSION - SIMPLE MED
2.5000 [IU]/h | INTRAVENOUS | Status: DC
  Administered 2015-05-23: 700 mL via INTRAVENOUS
  Administered 2015-05-23: 1 mL via INTRAVENOUS

## 2015-05-22 MED ORDER — FENTANYL CITRATE (PF) 100 MCG/2ML IJ SOLN
50.0000 ug | INTRAMUSCULAR | Status: AC
  Administered 2015-05-22: 50 ug via INTRAVENOUS

## 2015-05-22 MED ORDER — ONDANSETRON HCL 4 MG/2ML IJ SOLN
4.0000 mg | Freq: Four times a day (QID) | INTRAMUSCULAR | Status: DC | PRN
  Administered 2015-05-22 (×2): 4 mg via INTRAVENOUS
  Filled 2015-05-22: qty 2

## 2015-05-22 MED ORDER — BETAMETHASONE SOD PHOS & ACET 6 (3-3) MG/ML IJ SUSP
12.0000 mg | Freq: Every day | INTRAMUSCULAR | Status: DC
  Administered 2015-05-22: 12 mg via INTRAMUSCULAR
  Filled 2015-05-22: qty 1

## 2015-05-22 SURGICAL SUPPLY — 30 items
CANISTER SUCT 3000ML (MISCELLANEOUS) ×3 IMPLANT
CATH KIT ON-Q SILVERSOAK 5IN (CATHETERS) ×6 IMPLANT
CLOSURE WOUND 1/2 X4 (GAUZE/BANDAGES/DRESSINGS) ×1
DRSG TELFA 3X8 NADH (GAUZE/BANDAGES/DRESSINGS) IMPLANT
ELECT CAUTERY BLADE 6.4 (BLADE) ×3 IMPLANT
ELECT REM PT RETURN 9FT ADLT (ELECTROSURGICAL) ×3
ELECTRODE REM PT RTRN 9FT ADLT (ELECTROSURGICAL) ×1 IMPLANT
GAUZE SPONGE 4X4 12PLY STRL (GAUZE/BANDAGES/DRESSINGS) IMPLANT
GLOVE BIO SURGEON STRL SZ7 (GLOVE) ×15 IMPLANT
GLOVE INDICATOR 7.5 STRL GRN (GLOVE) ×12 IMPLANT
GOWN STRL REUS W/ TWL LRG LVL3 (GOWN DISPOSABLE) ×4 IMPLANT
GOWN STRL REUS W/TWL LRG LVL3 (GOWN DISPOSABLE) ×8
LIQUID BAND (GAUZE/BANDAGES/DRESSINGS) ×3 IMPLANT
NS IRRIG 1000ML POUR BTL (IV SOLUTION) ×3 IMPLANT
PACK C SECTION AR (MISCELLANEOUS) ×3 IMPLANT
PAD OB MATERNITY 4.3X12.25 (PERSONAL CARE ITEMS) ×3 IMPLANT
PAD PREP 24X41 OB/GYN DISP (PERSONAL CARE ITEMS) ×3 IMPLANT
SPONGE LAP 18X18 5 PK (GAUZE/BANDAGES/DRESSINGS) ×6 IMPLANT
STRIP CLOSURE SKIN 1/2X4 (GAUZE/BANDAGES/DRESSINGS) ×2 IMPLANT
SUT CHROMIC GUT BROWN 0 54 (SUTURE) ×1 IMPLANT
SUT CHROMIC GUT BROWN 0 54IN (SUTURE) ×3
SUT MNCRL 4-0 (SUTURE) ×2
SUT MNCRL 4-0 27XMFL (SUTURE) ×1
SUT PDS AB 1 TP1 96 (SUTURE) ×3 IMPLANT
SUT PLAIN 2 0 XLH (SUTURE) ×3 IMPLANT
SUT VIC AB 0 CT1 36 (SUTURE) ×12 IMPLANT
SUT VIC AB 3-0 SH 27 (SUTURE) ×2
SUT VIC AB 3-0 SH 27X BRD (SUTURE) ×1 IMPLANT
SUTURE MNCRL 4-0 27XMF (SUTURE) ×1 IMPLANT
SWABSTK COMLB BENZOIN TINCTURE (MISCELLANEOUS) IMPLANT

## 2015-05-22 NOTE — Progress Notes (Signed)
Patient has continued to make change with her labor. She is now 4cm and her contractions are stronger.  She adamantly refuses TOLAC.  I recommended this as she would be a good candidate and the decision to commit to delivery would not need to be made. She still wants a c-section.   Given that she is now 4cm and has a prior c-section, will proceed to cesarean delivery.  Risks of surgery discussed with her and all questions answered. Fetal well being remains category 1. Discussed findings on UDS. She states she only took one dose of methadone. Peds aware.  Thomasene MohairStephen Laikyn Gewirtz, MD 05/22/2015 11:42 PM

## 2015-05-22 NOTE — Anesthesia Preprocedure Evaluation (Addendum)
Anesthesia Evaluation  Patient identified by MRN, date of birth, ID band Patient awake    Reviewed: Allergy & Precautions, H&P , NPO status , Patient's Chart, lab work & pertinent test results  History of Anesthesia Complications Negative for: history of anesthetic complications  Airway Mallampati: III  TM Distance: >3 FB Neck ROM: full    Dental  (+) Poor Dentition   Pulmonary neg shortness of breath, Current Smoker,    Pulmonary exam normal breath sounds clear to auscultation       Cardiovascular Exercise Tolerance: Good (-) hypertension(-) angina(-) Past MI and (-) DOE negative cardio ROS Normal cardiovascular exam Rhythm:regular Rate:Normal     Neuro/Psych PSYCHIATRIC DISORDERS Depression negative neurological ROS     GI/Hepatic negative GI ROS, (+)     substance abuse  ,   Endo/Other  negative endocrine ROS  Renal/GU negative Renal ROS  negative genitourinary   Musculoskeletal   Abdominal   Peds  Hematology negative hematology ROS (+)   Anesthesia Other Findings Past Medical History:   Current smoker                                               Low grade squamous intraepithelial lesion (LGS*              Depression                                                     Comment:does not want anyone incl  FOB to know   Marijuana use                                                Scoliosis                                                    Late prenatal care                                             Comment:@ 23 wks  Past Surgical History:   CESAREAN SECTION                                 09/10/2009      Comment:FTP/FITL, meconium  BMI    Body Mass Index   23.06 kg/m 2      Reproductive/Obstetrics (+) Pregnancy                            Anesthesia Physical Anesthesia Plan  ASA: III and emergent  Anesthesia Plan: Spinal   Post-op Pain Management:    Induction:    Airway Management Planned:   Additional Equipment:   Intra-op Plan:   Post-operative Plan:   Informed  Consent: I have reviewed the patients History and Physical, chart, labs and discussed the procedure including the risks, benefits and alternatives for the proposed anesthesia with the patient or authorized representative who has indicated his/her understanding and acceptance.   Dental Advisory Given  Plan Discussed with: Anesthesiologist, CRNA and Surgeon  Anesthesia Plan Comments:         Anesthesia Quick Evaluation

## 2015-05-22 NOTE — H&P (Signed)
OB History & Physical   History of Present Illness:  Chief Complaint:  I have been leaking fluid since midnight and had a real big gush at 3:30 this afternoon. My contractions are painful." HPI:  Annette Roberts is a 30 y.o. G1P1001 female with EDC=07/05/2015 at [redacted]w[redacted]d dated by LMP. Her only ultrasound at 24 weeks 3 days differed by 8 days (EDC=07/13/2015).  Her pregnancy has been complicated by late entry to care, insufficient care (only seen 3 times in office), MJ use, depression, previous low transverse cesarean section for FITL and FTP in 2011, and a LGSIL Pap. .  She presents to L&D for evaluation of PPROM and preterm labor. She reports that her contractions started about an hour PTA and have increased in frequency and intensity. No vaginal bleeding. ROS is positive for rhinorrhea, cough, sinus pressure x 3 days. Prenatal care site: Prenatal care at {[facility].      Maternal Medical History:   Past Medical History  Diagnosis Date  . Current smoker   . Low grade squamous intraepithelial lesion (LGSIL) on cervical Pap smear   . Depression   . Marijuana use     Past Surgical History  Procedure Laterality Date  . Cesarean section  2011    FTP/FITL    No Known Allergies  Prior to Admission medications   Medication Sig Start Date End Date Taking? Authorizing Provider  Naproxen Sodium (ALEVE PO) Take 2 tablets by mouth as needed.   Yes Historical Provider, MD  Prenatal Multivit-Min-Fe-FA (PRENATAL VITAMINS PO) Take 1 tablet by mouth daily.   Yes Historical Provider, MD  ranitidine (ZANTAC) 150 MG tablet Take 150 mg by mouth 2 (two) times daily.   Yes Historical Provider, MD          Social History: She  reports that she has been smoking Cigarettes.  She has been smoking about 0.50 packs per day. She does not have any smokeless tobacco history on file. She reports that she uses illicit drugs (Marijuana). She reports that she does not drink alcohol.  Family History: family  history is not on file.   Review of Systems: Negative x 10 systems reviewed except as noted in the HPI.      Physical Exam:  Vital Signs: on arrival had a couple of diastolics in the 90s. BP now 130/81 General: appears uncomfortable, tensing with contractions HEENT: normocephalic, atraumatic Heart: regular rate & rhythm.  No murmurs Lungs: wheezing bilaterally Abdomen: soft, gravid, tender  With contractions;  EFW:4#8oz per US Pelvic:   External: Normal external female genitalia  Cervix: 3/90%/-1 to 0    ROM: + pooling; + nitrazine; +/- ferning Extremities: non-tender, symmetric, trace edema bilaterally.  DTRs:+3/+3 Neurologic: Alert & oriented x 3.    Pertinent Results:  Prenatal Labs: Blood type/Rh O positive  Antibody screen Pending 3/29  Rubella Varicella Immune immune  RPR nonreactive  HBsAg negative  HIV negative  GC Negative on 03/12/15  Chlamydia Negative on 03/12/15  Genetic screening Too late for testing  1 hour GTT Not done  3 hour GTT NA  GBS pending on 05/22/15  FHR with baseline 130s with accelerations to 150s, moderate variability, no decelerations Contractions: q2-4 min apart  Baseline WUJ:WJXBJYN Ultrasound:  Number of Fetus: singleton  Presentation: cephalic      Assessment:  Annette Roberts is a 30 y.o. G10P1001 female at [redacted]w[redacted]d with PPROM and possible early labor Prior Cesarean section-declines VBAC Inadequate prenatal care Mildly elevated blood pressures-probably due to  pain with contractions  Rule out Preeclampsia  Plan:  1. Admit to Labor & Delivery -Dr Jean RosenthalJackson notified  2. CBC, T&S, Clrs, IVF, CMP 3. Betamethasone   4. Consents obtained. 5. GBS and Chlamydia/GC cultures 6. Latency antibiotics 7. Hold tocolysis per ACOG recommendations-plan repeat Cesarean section if continues to dilate 8. Discussed risks of prematurity for baby and anticipated length of stay for a baby born at this gestation  Farrel ConnersGUTIERREZ, Gari Trovato  05/22/2015 8:14 PM

## 2015-05-22 NOTE — Progress Notes (Signed)
1905 - [facility] reports she is G2P1, 36wks, noticed leaking fluid since last night about midnight but today her water broke @ 1745 this afternoon, large gush of fluid and since that time she continues leaking clear fluid and has painful contractions coming every 8 mins with lower back pain, confirms active fetal mvmt, denies bloody show, spotting, urinary sym, nausea or vomiting. Reports hx prior c/s and desires repeat c-section. Last ate regular meal, pizza @ 1820p. Pt in restroom to change into hospital gown, says she is unable to provide urine sample at this time.   1910 - C. Sharen HonesGutierrez, CNM and Dr Jean RosenthalJackson at nurses station made aware of pt arrival and c/o ROM, preterm and having painful contractions with hx prior c-section with plan for repeat c-section.

## 2015-05-23 LAB — CBC
HEMATOCRIT: 29.3 % — AB (ref 35.0–47.0)
Hemoglobin: 10.3 g/dL — ABNORMAL LOW (ref 12.0–16.0)
MCH: 30.9 pg (ref 26.0–34.0)
MCHC: 35 g/dL (ref 32.0–36.0)
MCV: 88.2 fL (ref 80.0–100.0)
Platelets: 155 10*3/uL (ref 150–440)
RBC: 3.32 MIL/uL — AB (ref 3.80–5.20)
RDW: 13.1 % (ref 11.5–14.5)
WBC: 23.7 10*3/uL — AB (ref 3.6–11.0)

## 2015-05-23 MED ORDER — FERROUS SULFATE 325 (65 FE) MG PO TABS
325.0000 mg | ORAL_TABLET | Freq: Two times a day (BID) | ORAL | Status: DC
  Administered 2015-05-23 – 2015-05-25 (×5): 325 mg via ORAL
  Filled 2015-05-23 (×5): qty 1

## 2015-05-23 MED ORDER — BUPIVACAINE ON-Q PAIN PUMP (FOR ORDER SET NO CHG)
INJECTION | Status: DC
  Filled 2015-05-23: qty 1

## 2015-05-23 MED ORDER — PHENYLEPHRINE HCL 10 MG/ML IJ SOLN
INTRAMUSCULAR | Status: DC | PRN
  Administered 2015-05-23: 100 ug via INTRAVENOUS

## 2015-05-23 MED ORDER — MIDAZOLAM HCL 2 MG/2ML IJ SOLN
INTRAMUSCULAR | Status: DC | PRN
  Administered 2015-05-23 (×2): 1 mg via INTRAVENOUS

## 2015-05-23 MED ORDER — SENNOSIDES-DOCUSATE SODIUM 8.6-50 MG PO TABS
2.0000 | ORAL_TABLET | ORAL | Status: DC
  Administered 2015-05-24 – 2015-05-25 (×2): 2 via ORAL
  Filled 2015-05-23 (×4): qty 2

## 2015-05-23 MED ORDER — DIBUCAINE 1 % RE OINT: 1.0000 "application " | TOPICAL_OINTMENT | RECTAL | Status: DC | PRN

## 2015-05-23 MED ORDER — BUPIVACAINE HCL 0.5 % IJ SOLN
INTRAMUSCULAR | Status: DC | PRN
  Administered 2015-05-23: 10 mL

## 2015-05-23 MED ORDER — MORPHINE SULFATE (PF) 0.5 MG/ML IJ SOLN
INTRAMUSCULAR | Status: DC | PRN
  Administered 2015-05-23: .1 mg via INTRATHECAL

## 2015-05-23 MED ORDER — DIPHENHYDRAMINE HCL 50 MG/ML IJ SOLN: 12.5000 mg | INTRAMUSCULAR | Status: DC | PRN

## 2015-05-23 MED ORDER — NALOXONE HCL 2 MG/2ML IJ SOSY
1.0000 ug/kg/h | PREFILLED_SYRINGE | INTRAMUSCULAR | Status: DC | PRN
  Filled 2015-05-23: qty 2

## 2015-05-23 MED ORDER — DIPHENHYDRAMINE HCL 25 MG PO CAPS: 25.0000 mg | ORAL_CAPSULE | ORAL | Status: DC | PRN

## 2015-05-23 MED ORDER — NALBUPHINE HCL 10 MG/ML IJ SOLN: 5.0000 mg | Freq: Once | INTRAMUSCULAR | Status: DC | PRN

## 2015-05-23 MED ORDER — SIMETHICONE 80 MG PO CHEW
80.0000 mg | CHEWABLE_TABLET | Freq: Three times a day (TID) | ORAL | Status: DC
  Administered 2015-05-23 – 2015-05-26 (×10): 80 mg via ORAL
  Filled 2015-05-23 (×11): qty 1

## 2015-05-23 MED ORDER — OXYCODONE HCL 5 MG/5ML PO SOLN: 5.0000 mg | Freq: Once | ORAL | Status: DC | PRN

## 2015-05-23 MED ORDER — IBUPROFEN 600 MG PO TABS
600.0000 mg | ORAL_TABLET | Freq: Four times a day (QID) | ORAL | Status: DC
  Administered 2015-05-24 – 2015-05-26 (×10): 600 mg via ORAL
  Filled 2015-05-23 (×10): qty 1

## 2015-05-23 MED ORDER — LANOLIN HYDROUS EX OINT: 1.0000 "application " | TOPICAL_OINTMENT | CUTANEOUS | Status: DC | PRN

## 2015-05-23 MED ORDER — BUPIVACAINE IN DEXTROSE 0.75-8.25 % IT SOLN
INTRATHECAL | Status: DC | PRN
  Administered 2015-05-23: 1.8 mL via INTRATHECAL

## 2015-05-23 MED ORDER — MENTHOL 3 MG MT LOZG
1.0000 | LOZENGE | OROMUCOSAL | Status: DC | PRN
  Filled 2015-05-23: qty 9

## 2015-05-23 MED ORDER — ALBUTEROL SULFATE (2.5 MG/3ML) 0.083% IN NEBU
3.0000 mL | INHALATION_SOLUTION | RESPIRATORY_TRACT | Status: DC | PRN
  Administered 2015-05-24 (×2): 3 mL via RESPIRATORY_TRACT
  Filled 2015-05-23 (×2): qty 3

## 2015-05-23 MED ORDER — PRENATAL MULTIVITAMIN CH
1.0000 | ORAL_TABLET | Freq: Every day | ORAL | Status: DC
  Administered 2015-05-23 – 2015-05-26 (×3): 1 via ORAL
  Filled 2015-05-23 (×4): qty 1

## 2015-05-23 MED ORDER — FENTANYL CITRATE (PF) 100 MCG/2ML IJ SOLN
INTRAMUSCULAR | Status: DC | PRN
  Administered 2015-05-23: 15 ug via INTRATHECAL
  Administered 2015-05-23 (×3): 50 ug via INTRAVENOUS

## 2015-05-23 MED ORDER — CALCIUM CARBONATE ANTACID 500 MG PO CHEW
1.0000 | CHEWABLE_TABLET | Freq: Two times a day (BID) | ORAL | Status: DC
  Administered 2015-05-23 – 2015-05-26 (×4): 200 mg via ORAL
  Filled 2015-05-23 (×5): qty 1

## 2015-05-23 MED ORDER — NALOXONE HCL 0.4 MG/ML IJ SOLN: 0.4000 mg | INTRAMUSCULAR | Status: DC | PRN

## 2015-05-23 MED ORDER — WITCH HAZEL-GLYCERIN EX PADS
1.0000 | MEDICATED_PAD | CUTANEOUS | Status: DC | PRN
Start: 2015-05-23 — End: 2015-05-26

## 2015-05-23 MED ORDER — ONDANSETRON HCL 4 MG/2ML IJ SOLN: 4.0000 mg | Freq: Three times a day (TID) | INTRAMUSCULAR | Status: DC | PRN

## 2015-05-23 MED ORDER — DIPHENHYDRAMINE HCL 25 MG PO CAPS: 25.0000 mg | ORAL_CAPSULE | Freq: Four times a day (QID) | ORAL | Status: DC | PRN

## 2015-05-23 MED ORDER — NALBUPHINE HCL 10 MG/ML IJ SOLN: 5.0000 mg | INTRAMUSCULAR | Status: DC | PRN

## 2015-05-23 MED ORDER — FENTANYL CITRATE (PF) 100 MCG/2ML IJ SOLN: 25.0000 ug | INTRAMUSCULAR | Status: DC | PRN

## 2015-05-23 MED ORDER — LACTATED RINGERS IV SOLN
INTRAVENOUS | Status: DC
Start: 2015-05-23 — End: 2015-05-26

## 2015-05-23 MED ORDER — SODIUM CHLORIDE 0.9% FLUSH: 3.0000 mL | INTRAVENOUS | Status: DC | PRN

## 2015-05-23 MED ORDER — OXYTOCIN 40 UNITS IN LACTATED RINGERS INFUSION - SIMPLE MED
2.5000 [IU]/h | INTRAVENOUS | Status: AC
  Administered 2015-05-23: 2.5 [IU]/h via INTRAVENOUS
  Filled 2015-05-23: qty 1000

## 2015-05-23 MED ORDER — ACETAMINOPHEN 500 MG PO TABS
1000.0000 mg | ORAL_TABLET | Freq: Four times a day (QID) | ORAL | Status: AC
  Administered 2015-05-23 (×3): 1000 mg via ORAL
  Filled 2015-05-23 (×3): qty 2

## 2015-05-23 MED ORDER — BUPIVACAINE 0.25 % ON-Q PUMP DUAL CATH 400 ML
INJECTION | Status: AC
  Filled 2015-05-23: qty 400

## 2015-05-23 MED ORDER — KETOROLAC TROMETHAMINE 30 MG/ML IJ SOLN
30.0000 mg | Freq: Four times a day (QID) | INTRAMUSCULAR | Status: AC | PRN
  Administered 2015-05-23 (×3): 30 mg via INTRAVENOUS
  Filled 2015-05-23 (×4): qty 1

## 2015-05-23 MED ORDER — OXYCODONE HCL 5 MG PO TABS: 5.0000 mg | ORAL_TABLET | Freq: Once | ORAL | Status: DC | PRN

## 2015-05-23 MED ORDER — OXYCODONE-ACETAMINOPHEN 5-325 MG PO TABS: 1.0000 | ORAL_TABLET | ORAL | Status: DC | PRN

## 2015-05-23 MED ORDER — OXYCODONE-ACETAMINOPHEN 5-325 MG PO TABS
2.0000 | ORAL_TABLET | ORAL | Status: DC | PRN
  Administered 2015-05-24 – 2015-05-26 (×13): 2 via ORAL
  Filled 2015-05-23 (×13): qty 2

## 2015-05-23 MED ORDER — KETOROLAC TROMETHAMINE 30 MG/ML IJ SOLN
30.0000 mg | Freq: Four times a day (QID) | INTRAMUSCULAR | Status: AC | PRN
  Filled 2015-05-23: qty 1

## 2015-05-23 NOTE — Progress Notes (Signed)
Patient has returned to her room, escorted via wheelchair by her significant other.   Imagene ShellerMegan Shawn Dannenberg, RN

## 2015-05-23 NOTE — Transfer of Care (Signed)
Immediate Anesthesia Transfer of Care Note  Patient: Annette Roberts  Procedure(s) Performed: Procedure(s): CESAREAN SECTION (N/A)  Patient Location: PACU  Anesthesia Type:Spinal  Level of Consciousness: awake, oriented and sedated  Airway & Oxygen Therapy: Patient Spontanous Breathing  Post-op Assessment: Report given to RN and Post -op Vital signs reviewed and stable  Post vital signs: Reviewed and stable  Last Vitals:  Filed Vitals:   05/22/15 2327 05/22/15 2350  BP: 124/61 121/76  Pulse: 75 75  Temp:    Resp: 20 18    Complications: No apparent anesthesia complications

## 2015-05-23 NOTE — Anesthesia Procedure Notes (Addendum)
Spinal Patient location during procedure: OR Start time: 05/23/2015 12:07 AM End time: 05/23/2015 12:08 AM Staffing Anesthesiologist: Katy Fitch K Performed by: anesthesiologist  Preanesthetic Checklist Completed: patient identified, site marked, surgical consent, pre-op evaluation, timeout performed, IV checked, risks and benefits discussed and monitors and equipment checked Spinal Block Patient position: sitting Prep: Betadine Patient monitoring: heart rate, continuous pulse ox, blood pressure and cardiac monitor Approach: midline Location: L4-5 Injection technique: single-shot Needle Needle type: Whitacre and Introducer  Needle gauge: 25 G Needle length: 9 cm Assessment Sensory level: T5. Additional Notes Negative paresthesia. Negative blood return. Positive free-flowing CSF. Expiration date of kit checked and confirmed. Patient tolerated procedure well, without complications.    Date/Time: 05/23/2015 12:35 AM Performed by: Nelda Marseille Pre-anesthesia Checklist: Patient identified, Emergency Drugs available, Suction available, Patient being monitored and Timeout performed Oxygen Delivery Method: Nasal cannula

## 2015-05-23 NOTE — Progress Notes (Signed)
Patient asked to go downstairs to get fresh air. RN strongly recommended that patient stay on floor because she had only gotten out of bed once and was still receiving IV fluids and attached to IV pole. RN educated patient about the fact that she was at risk for falling because of these factors and asked patient to wait at least until she was disconnected from the IV. Patient refused and was escorted downstairs via wheelchair by her significant other.   Imagene ShellerMegan Romulo Okray, RN

## 2015-05-23 NOTE — Progress Notes (Signed)
Pt into nursery to visit with infant, asked for some time to bond with baby, placed skin to skin on mom's chest.

## 2015-05-23 NOTE — Discharge Summary (Signed)
OB Discharge Summary  Patient Name: Annette Roberts DOB: 1985/05/30 MRN: [id number]  Date of admission: 05/22/2015 Delivering MD: Thomasene Mohair, MD Date of Delivery: 05/23/2015  Date of discharge: 05/26/2015  Admitting diagnosis: Preterm premature rupture of membranes at 33wk5d Preterm labor, Previous cesarean section  Intrauterine pregnancy: [redacted]w[redacted]d      Secondary diagnosis: Inadequate prenatal care     Discharge diagnosis: Preterm Pregnancy Delivered   , repeat low transverse cesarean section, Tobacco abuse, Hilar lymphadenopathy, Bronchitis                                                                                             Post partum procedures:Chest X-ray, CT of chest  Augmentation: None  Complications: None  Hospital course: The patient was admitted at [redacted]w[redacted]d gestational age with grossly ruptured membranes. She was 3cm on admission and contracting painfully. She was given a dose of betamethasone and was started on latency antibiotics.  She was not tocolyzed. She continued to have worsening contractions and her cervix changed to 4cm.  She was counseled extensively regarding TOLAC, but adamantly refused.  So, she was taken to the OR for a repeat cesarean delivery,which occurred without incident. She had a UDS on admission which showed methadone and opiates. She admitted to taking a single dose of methadone recently for her back pain. She was noted to have an elevated AST on admission, which was also noted during a hospital visit in January this year.  A hepatitis panel was drawn. The results were not returned at the time of discharge. Her postpartum course was remarkable also for probable bronchitis (cough, sputum production, wheezing and rhonchi). A chest X-ray revealed hilar lymphadenopathy on the right. A hospitalist was consulted on PPD #2 regarding the chest X-ray findings and patient was begun on a Z-pak and Albuterol inhaler. A CT scan was also recommended which showed mildly  enlarged right hilar and mediastinal lymph nodes. On PPD #3, there were still some wheezing in both lungs, but there was definite improvement. She will be followed up by a pulmonologist at [facility] for the hilar LAN. (Spoke with the ON call physician today from [place]). Annette Roberts was afebrile, ambulating without assist, and had return of normal bowel and bladder function. There was an occasional elevated blood pressure, but was usually normotensive. Incision was healing well, but there was a small area on the right side of the wound that had drained a small amt of bloody drainage. On Q was discontinued per patient request. She was discharged on POD #3  Physical exam  Filed Vitals:   05/26/15 0047 05/26/15 0455 05/26/15 0819 05/26/15 1300  BP: 131/103 136/82 128/72 129/81  Pulse: 66 57 57 86  Temp: 98.7 F (37.1 C) 98.6 F (37 C) 98.1 F (36.7 C) 98.3 F (36.8 C)  TempSrc: Oral Oral Oral Oral  Resp: Height:      Weight:      SpO2: 98% 99%     General: alert, cooperative and no distress Lochia: appropriate Uterine Fundus: firm/ U-2/ML/NT Incision: No significant erythema, small area on the right side of incision that  started draining blood tinged fluid last night (small nickel sized spot of blood on her dressing) DVT Evaluation: No evidence of DVT seen on physical exam. No significant calf/ankle edema.  Labs: Lab Results  Component Value Date   WBC 23.7* 05/23/2015   HGB 10.3* 05/23/2015   HCT 29.3* 05/23/2015   MCV 88.2 05/23/2015   PLT 155 05/23/2015   CMP Latest Ref Rng 05/22/2015  Glucose 65 - 99 mg/dL 960(A101(H)  BUN 6 - 20 mg/dL 9  Creatinine 5.400.44 - 9.811.00 mg/dL 1.910.49  Sodium 478135 - 295145 mmol/L 135  Potassium 3.5 - 5.1 mmol/L 3.7  Chloride 101 - 111 mmol/L 108  CO2 22 - 32 mmol/L 21(L)  Calcium 8.9 - 10.3 mg/dL 7.9(L)  Total Protein 6.5 - 8.1 g/dL 6.2(Z5.6(L)  Total Bilirubin 0.3 - 1.2 mg/dL 0.6  Alkaline Phos 38 - 126 U/L 211(H)  AST 15 - 41 U/L 44(H)  ALT 14 - 54 U/L  32    Discharge instruction: per After Visit Summary.  Medications:    Medication List    STOP taking these medications        ALEVE PO     ranitidine 150 MG tablet  Commonly known as:  ZANTAC      TAKE these medications        albuterol 108 (90 Base) MCG/ACT inhaler  Commonly known as:  PROVENTIL HFA;VENTOLIN HFA  Inhale 2 puffs into the lungs every 6 (six) hours.     azithromycin 250 MG tablet  Commonly known as:  ZITHROMAX  Take one daily  Start taking on:  05/27/2015     ibuprofen 600 MG tablet  Commonly known as:  ADVIL,MOTRIN  Take 1 tablet (600 mg total) by mouth every 6 (six) hours as needed for mild pain, moderate pain or cramping.     oxyCODONE-acetaminophen 5-325 MG tablet  Commonly known as:  PERCOCET/ROXICET  Take 1-2 tablets by mouth every 6 (six) hours as needed for severe pain (pain scale 7-10).     PRENATAL VITAMINS PO  Take 1 tablet by mouth daily.        Diet: routine diet  Activity: Advance as tolerated. Pelvic rest for 6 weeks.   Outpatient follow up: To call for a followup appt in [facility] week at [facility] for incision check [facility] will call to schedule appt with pulmonary in 3-4 weeks.  Postpartum contraception: ?Paraguard Rhogam Given postpartum: no Rubella vaccine given postpartum: no Varicella vaccine given postpartum: no TDaP given antepartum or postpartum: not antepartum, last dose 5 years ago Flu vaccine given antepartum or postpartum: not antepartum  Newborn Data: Live born female  Birth Weight: 4 lb 6.9 oz (2010 g) APGAR: 9, 9   Baby Feeding: Bottle  Disposition:NICU  SIGNED:Fatiha Guzy, CNM

## 2015-05-23 NOTE — Op Note (Signed)
Cesarean Section Procedure Note   Annette RouxStacie A Rivers   05/23/2015   Pre-operative Diagnosis:  1) Intrauterine pregnancy at 1869w6d  2) preterm premature rupture of membranes  3) labor 4) history of cesarean section, desires repeat  Post-operative Diagnosis:  1) Intrauterine pregnancy at 3669w6d  2) preterm premature rupture of membranes  3) labor 4) history of cesarean section, desires repeat  Procedure: Repeat low transverse cesarean section  Surgeon: Surgeon(s) and Role:    * Conard NovakStephen D Chelsea Pedretti, MD - Primary   Assistants: Farrel Connersolleen Gutierrez, CNM  Anesthesia: spinal   Findings:  1) normal appearing gravid uterus, fallopian tubes, and ovaries 2) viable female infant with weight of 2010 grams, APGARS 9 at one minute and 9 at 5 minutes   Estimated Blood Loss: 550 mL  Total IV Fluids: 1,200 ml crystalloid  Urine Output: 350 mL clear urine at end of case  Specimens: Placenta  Complications: no complications  Disposition: PACU - hemodynamically stable.   Maternal Condition: stable   Baby condition / location:  NICU  Procedure Details:  The patient was seen in the Holding Room. The risks, benefits, complications, treatment options, and expected outcomes were discussed with the patient. The patient concurred with the proposed plan, giving informed consent. identified as Annette Roberts and the procedure verified as C-Section Delivery. A Time Out was held and the above information confirmed.   After induction of anesthesia, the patient was draped and prepped in the usual sterile manner. A Pfannenstiel incision was made and carried down through the subcutaneous tissue to the fascia. Fascial incision was made and extended transversely. The fascia was separated from the underlying rectus tissue superiorly and inferiorly. The peritoneum was identified and entered. Peritoneal incision was extended longitudinally. The bladder flap was not freed from the lower uterine segment. A low transverse  uterine incision was made and the hysterotomy was extended with cranial-caudal tension. Delivered from cephalic presentation was a 2,010 gram Living newborn infant(s) or Female with Apgar scores of 9 at one minute and 9 at five minutes. Cord ph was not sent the umbilical cord was clamped and cut cord blood was obtained for evaluation. The placenta was removed Intact and appeared normal. The uterine outline, tubes and ovaries appeared normal. The uterine incision was closed with running locked sutures of 0 Vicryl.  A second layer of the same suture was thrown in an imbricating fashion.  Hemostasis was assured.  The uterus was returned to the abdomen and the paracolic gutters were cleared of all clots and debris.  The peritoneum was reapproximated using 0-Vicryl in a running fashion.  The rectus muscles were inspected and found to be hemostatic.  The On-Q catheter pumps were inserted in accordance with the manufacturer's recommendations.  The catheters were inserted approximately 4cm cephelad to the incision line, approximately 1cm apart, straddling the midline.  They were inserted to a depth of the 3rd mark. They were positioned superficial to the rectus abdominus muscles and deep to the rectus fascia.    The fascia was then reapproximated with running sutures of 1-0 PDS, looped.  Three interrupted sutures of 3-0 vicryl were thrown in the subcutaneous layer to decrease tension on the skin closure. The subcuticular closure was performed using 4-0 monocryl. The skin closure was reinforced using surgical glue.  The On-Q catheters were bolused with 5 mL of 0.5% marcaine plain for a total of 10 mL.  The catheters were affixed to the skin with surgical skin glue, steri-strips, and tegaderm.  Instrument, sponge, and needle counts were correct prior the abdominal closure and were correct at the conclusion of the case.  The patient received Ancef 2 gram IV prior to skin incision (within 30 minutes). For VTE  prophylaxis she was wearing SCDs throughout the case.   Signed: Conard Novak, MD 05/23/2015 1:20 AM

## 2015-05-24 LAB — CULTURE, BETA STREP (GROUP B ONLY)

## 2015-05-24 LAB — SURGICAL PATHOLOGY

## 2015-05-24 LAB — RPR: RPR: NONREACTIVE

## 2015-05-24 NOTE — Progress Notes (Signed)
Post Op Day 1 Subjective:   Tolerating PO intake, ambulating and voiding without difficulty, complaining of right side incisional pain and admits to possibly overdoing activity. Pt is in bed now and feeling more comfortable. Generally pain is controlled with ONq pump and PO pain meds.  Objective:  Blood pressure 148/81, pulse 61, temperature 98.8 F (37.1 C), temperature source Oral, resp. rate 20, height 5\' 1"  (1.549 m), weight 55.339 kg (122 lb), last menstrual period 09/28/2014, SpO2 98 %.  General: NAD Pulmonary: no increased work of breathing Abdomen: non-distended, non-tender, fundus firm at level of umbilicus Incision: C/D/I no s/s infection Extremities: no edema, no erythema, no tenderness  Results for Sherpa, Sigmund HazelSTACIE A (MRN [id number]) as of 05/24/2015 11:36  Ref. Range 05/23/2015 05:45  WBC Latest Ref Range: 3.6-11.0 K/uL 23.7 (H)  RBC Latest Ref Range: 3.80-5.20 MIL/uL 3.32 (L)  Hemoglobin Latest Ref Range: 12.0-16.0 g/dL 40.910.3 (L)  HCT Latest Ref Range: 35.0-47.0 % 29.3 (L)  MCV Latest Ref Range: 80.0-100.0 fL 88.2  MCH Latest Ref Range: 26.0-34.0 pg 30.9  MCHC Latest Ref Range: 32.0-36.0 g/dL 81.135.0  RDW Latest Ref Range: 11.5-14.5 % 13.1  Platelets Latest Ref Range: 150-440 K/uL 155    Assessment:   30 y.o. G2P1001 postoperativeday # 1   Plan:  1) Acute blood loss anemia - hemodynamically stable and asymptomatic - po ferrous sulfate  2) O POS / / Rubella Immune / Varicella Immune  3) TDAP: Prior to DC  4) Bottle/Contraception: unsure at this time  5) Disposition: Home day 2 or 3   Atianna Haidar, CNM   This patient and plan were discussed with Dr Elesa MassedWard 05/24/2015

## 2015-05-24 NOTE — Anesthesia Postprocedure Evaluation (Signed)
Anesthesia Post Note  Patient: Annette Roberts  Procedure(s) Performed: Procedure(s) (LRB): CESAREAN SECTION (N/A)  Patient location during evaluation: Mother Baby Anesthesia Type: Spinal Level of consciousness: awake and alert and oriented Pain management: pain level controlled Vital Signs Assessment: post-procedure vital signs reviewed and stable Respiratory status: spontaneous breathing and respiratory function stable Cardiovascular status: stable Postop Assessment: no headache, adequate PO intake and patient able to bend at knees    Last Vitals:  Filed Vitals:   05/23/15 2014 05/24/15 0402  BP: 115/69   Pulse: 53   Temp: 37.2 C 36.9 C  Resp: 20     Last Pain:  Filed Vitals:   05/24/15 0402  PainSc: 8                  Elyzabeth Goatley,  Khylah Kendra A

## 2015-05-24 NOTE — Lactation Note (Signed)
This note was copied from a baby's chart. Lactation Consultation Note  Patient Name: Boy Michelene HeadyStacie Boise EAVWU'JToday's Date: 05/24/2015  Mom's drug screen was positive for Methadone and Opiates.  Baby's screen was positive for opiates.  Discussed risks of drugs on newborn.  Mom declines offer to pump and dump.  Mom reports only wanting to bottlefeed formula.   Maternal Data    Feeding Feeding Type: Formula Length of feed: 30 min  LATCH Score/Interventions                      Lactation Tools Discussed/Used     Consult Status      Louis MeckelWilliams, Edith Lord Kay 05/24/2015, 5:17 PM

## 2015-05-24 NOTE — Anesthesia Post-op Follow-up Note (Signed)
  Anesthesia Pain Follow-up Note  Patient: Annette RouxStacie A Roberts  Day #: 1  Date of Follow-up: 05/24/2015 Time: 7:36 AM  Last Vitals:  Filed Vitals:   05/23/15 2014 05/24/15 0402  BP: 115/69   Pulse: 53   Temp: 37.2 C 36.9 C  Resp: 20     Level of Consciousness: alert  Pain: none   Side Effects:None  Catheter Site Exam:clean, dry, no drainage  Plan: D/C from anesthesia care  Michaele OfferSavage,  Farhaan Mabee A

## 2015-05-25 ENCOUNTER — Inpatient Hospital Stay: Payer: Medicaid Other

## 2015-05-25 ENCOUNTER — Encounter: Payer: Self-pay | Admitting: Radiology

## 2015-05-25 MED ORDER — AZITHROMYCIN 250 MG PO TABS
250.0000 mg | ORAL_TABLET | Freq: Every day | ORAL | Status: DC
  Administered 2015-05-26: 250 mg via ORAL
  Filled 2015-05-25: qty 1

## 2015-05-25 MED ORDER — IOPAMIDOL (ISOVUE-300) INJECTION 61%
75.0000 mL | Freq: Once | INTRAVENOUS | Status: AC | PRN
  Administered 2015-05-25: 75 mL via INTRAVENOUS

## 2015-05-25 MED ORDER — AZITHROMYCIN 250 MG PO TABS
500.0000 mg | ORAL_TABLET | Freq: Once | ORAL | Status: AC
  Administered 2015-05-25: 500 mg via ORAL
  Filled 2015-05-25: qty 2

## 2015-05-25 MED ORDER — ALBUTEROL SULFATE HFA 108 (90 BASE) MCG/ACT IN AERS
2.0000 | INHALATION_SPRAY | Freq: Four times a day (QID) | RESPIRATORY_TRACT | Status: DC
  Administered 2015-05-25 – 2015-05-26 (×4): 2 via RESPIRATORY_TRACT
  Filled 2015-05-25: qty 6.7

## 2015-05-25 MED ORDER — FERROUS SULFATE 325 (65 FE) MG PO TABS
325.0000 mg | ORAL_TABLET | Freq: Every day | ORAL | Status: DC
  Administered 2015-05-26: 325 mg via ORAL
  Filled 2015-05-25: qty 1

## 2015-05-25 NOTE — Progress Notes (Addendum)
Subjective:   Feeling tired. Ambulating and going outside to smoke. Getting breathing treatments for wheezing prn Passing flatus, had BM yesterday. Breasts sore. Voiding without difficulty. Baby in NICU  Objective:  Blood pressure 132/71, pulse 52, temperature 97.9 F (36.6 C), temperature source Oral, resp. rate 18, height 5\' 1"  (1.549 m), weight 55.339 kg (122 lb), last menstrual period 09/28/2014, SpO2 99 %.  General: NAD Heart: RRR without murmur Breasts: filling/ firm Pulmonary: no increased work of breathing, wheezes and rhonchi bilaterally Abdomen: non-distended, non-tender, fundus firm at level of umbilicus-2FB Incision: C&D&I, ON Q intact Extremities: no edema, no erythema, no tenderness  Results for orders placed or performed during the hospital encounter of 05/22/15 (from the past 72 hour(s))  Urine Drug Screen, Qualitative ([facility] only)     Status: Abnormal   Collection Time: 05/22/15  8:08 PM  Result Value Ref Range   Tricyclic, Ur Screen NONE DETECTED NONE DETECTED   Amphetamines, Ur Screen NONE DETECTED NONE DETECTED   MDMA (Ecstasy)Ur Screen NONE DETECTED NONE DETECTED   Cocaine Metabolite,Ur [place] NONE DETECTED NONE DETECTED   Opiate, Ur Screen POSITIVE (A) NONE DETECTED   Phencyclidine (PCP) Ur S NONE DETECTED NONE DETECTED   Cannabinoid 50 Ng, Ur [place] NONE DETECTED NONE DETECTED   Barbiturates, Ur Screen NONE DETECTED NONE DETECTED   Benzodiazepine, Ur Scrn NONE DETECTED NONE DETECTED   Methadone Scn, Ur POSITIVE (A) NONE DETECTED    Comment: (NOTE) 100  Tricyclics, urine               Cutoff 1000 ng/mL 200  Amphetamines, urine             Cutoff 1000 ng/mL 300  MDMA (Ecstasy), urine           Cutoff 500 ng/mL 400  Cocaine Metabolite, urine       Cutoff 300 ng/mL 500  Opiate, urine                   Cutoff 300 ng/mL 600  Phencyclidine (PCP), urine      Cutoff 25 ng/mL 700  Cannabinoid, urine              Cutoff 50 ng/mL 800  Barbiturates, urine             Cutoff  200 ng/mL 900  Benzodiazepine, urine           Cutoff 200 ng/mL 1000 Methadone, urine                Cutoff 300 ng/mL 1100 1200 The urine drug screen provides only a preliminary, unconfirmed 1300 analytical test result and should not be used for non-medical 1400 purposes. Clinical consideration and professional judgment should 1500 be applied to any positive drug screen result due to possible 1600 interfering substances. A more specific alternate chemical method 1700 must be used in order to obtain a confirmed analytical result.  1800 Gas chromato graphy / mass spectrometry (GC/MS) is the preferred 1900 confirmatory method.   Culture, beta strep (group b only)     Status: None   Collection Time: 05/22/15  8:08 PM  Result Value Ref Range   Specimen Description VAGINAL/RECTAL    Special Requests NONE    Culture NO BETA HEMOLYTIC STREPTOCOCCI ISOLATED    Report Status 05/24/2015 FINAL   Type and screen [facility]     Status: None   Collection Time: 05/22/15  8:08 PM  Result Value Ref Range   ABO/RH(D)  O POS    Antibody Screen NEG    Sample Expiration 05/25/2015   Chlamydia/NGC rt PCR ([facility] only)     Status: None   Collection Time: 05/22/15  8:08 PM  Result Value Ref Range   Specimen source GC/Chlam ENDOCERVICAL    Chlamydia Tr NOT DETECTED NOT DETECTED   N gonorrhoeae NOT DETECTED NOT DETECTED    Comment: (NOTE) 100  This methodology has not been evaluated in pregnant women or in 200  patients with a history of hysterectomy. 300 400  This methodology will not be performed on patients less than 21  years of age.   Protein / creatinine ratio, urine     Status: None   Collection Time: 05/22/15  8:08 PM  Result Value Ref Range   Creatinine, Urine 89 mg/dL   Total Protein, Urine 13 mg/dL    Comment: NO NORMAL RANGE ESTABLISHED FOR THIS TEST   Protein Creatinine Ratio 0.15 0.00 - 0.15 mg/mg[Cre]  Rapid HIV screen (HIV 1/2 Ab+Ag) ([facility] Only)     Status: None    Collection Time: 05/22/15  8:12 PM  Result Value Ref Range   HIV-1 P24 Antigen - HIV24 NON REACTIVE NON REACTIVE   HIV 1/2 Antibodies NON REACTIVE NON REACTIVE   Interpretation (HIV Ag Ab)      A non reactive test result means that HIV 1 or HIV 2 antibodies and HIV 1 p24 antigen were not detected in the specimen.  CBC     Status: Abnormal   Collection Time: 05/22/15  8:12 PM  Result Value Ref Range   WBC 16.7 (H) 3.6 - 11.0 K/uL   RBC 3.78 (L) 3.80 - 5.20 MIL/uL   Hemoglobin 11.7 (L) 12.0 - 16.0 g/dL   HCT 53.9 (L) 71.4 - 10.6 %   MCV 89.3 80.0 - 100.0 fL   MCH 30.9 26.0 - 34.0 pg   MCHC 34.6 32.0 - 36.0 g/dL   RDW 77.6 16.0 - 76.0 %   Platelets 174 150 - 440 K/uL  RPR     Status: None   Collection Time: 05/22/15  8:12 PM  Result Value Ref Range   RPR Ser Ql Non Reactive Non Reactive    Comment: (NOTE) Performed At: [facility] [address] [place], [place] [id number] Mila Homer MD CN:[id number]   Comprehensive metabolic panel     Status: Abnormal   Collection Time: 05/22/15  9:29 PM  Result Value Ref Range   Sodium 135 135 - 145 mmol/L   Potassium 3.7 3.5 - 5.1 mmol/L   Chloride 108 101 - 111 mmol/L   CO2 21 (L) 22 - 32 mmol/L   Glucose, Bld 101 (H) 65 - 99 mg/dL   BUN 9 6 - 20 mg/dL   Creatinine, Ser 8.93 0.44 - 1.00 mg/dL   Calcium 7.9 (L) 8.9 - 10.3 mg/dL   Total Protein 5.6 (L) 6.5 - 8.1 g/dL   Albumin 2.6 (L) 3.5 - 5.0 g/dL   AST 44 (H) 15 - 41 U/L   ALT 32 14 - 54 U/L   Alkaline Phosphatase 211 (H) 38 - 126 U/L   Total Bilirubin 0.6 0.3 - 1.2 mg/dL   GFR calc non Af Amer >60 >60 mL/min   GFR calc Af Amer >60 >60 mL/min    Comment: (NOTE) The eGFR has been calculated using the CKD EPI equation. This calculation has not been validated in all clinical situations. eGFR's persistently <60 mL/min signify possible Chronic Kidney Disease.  Anion gap 6 5 - 15  Surgical pathology     Status: None   Collection Time: 05/23/15 12:38 AM  Result  Value Ref Range   SURGICAL PATHOLOGY      Surgical Pathology CASE: [id number] PATIENT: Stevi Brannum Surgical Pathology Report     SPECIMEN SUBMITTED: A. Placenta  CLINICAL HISTORY: PPROM at 47 w 6 d  PRE-OPERATIVE DIAGNOSIS: Repeat cesarean  POST-OPERATIVE DIAGNOSIS: Same as pre-op     DIAGNOSIS: A. THIRD TRIMESTER PLACENTA; CESAREAN SECTION: - PLACENTAL WEIGHT APPROPRIATE FOR GESTATIONAL AGE. - ACUTE CHORIOAMNIONITIS, ABSENT FETAL IMMUNE RESPONSE. - NO VILLITIS OR INFARCTION. - THREE VESSEL UMBILICAL CORD.   GROSS DESCRIPTION: A. Labeled: placenta Weight: 467 grams Size: 16.2 x 13.6 x 2.6 cm Shape: ovoid Accessory lobes: none Membranes: pink-tan translucent  Umbilical cord:      Length - 39 cm in length x 1.2 cm in diameter      Number of vessels - 3      Insertion - eccentric      Distance of insertion from margin - 3.5 cm      Other findings - none noted  Fetal surface: granular blue-gray with minimal fibrin deposition on subchorionic plate  Maternal surface: torn and canno t be evaluated for completeness  Comments: none noted  Block summary: 1 - umbilical cord 2 - membrane rolls 3-4 - placental parenchyma  Final Diagnosis performed by Bryan Lemma, MD.  Electronically signed 05/24/2015 5:12:25PM    The electronic signature indicates that the named Attending Pathologist has evaluated the specimen  Technical component performed at [place], [address], [place], [place] [address] Lab: [phone] Dir: Darrick Penna. Evette Doffing, MD  Professional component performed at [facility], [facility], [place], [place], [place] [address] Lab: [phone] Dir: Dellia Nims. Rubinas, MD    CBC     Status: Abnormal   Collection Time: 05/23/15  5:45 AM  Result Value Ref Range   WBC 23.7 (H) 3.6 - 11.0 K/uL   RBC 3.32 (L) 3.80 - 5.20 MIL/uL   Hemoglobin 10.3 (L) 12.0 - 16.0 g/dL   HCT 29.3 (L) 35.0 - 47.0 %   MCV 88.2 80.0 - 100.0 fL    MCH 30.9 26.0 - 34.0 pg   MCHC 35.0 32.0 - 36.0 g/dL   RDW 13.1 11.5 - 14.5 %   Platelets 155 150 - 440 K/uL     Assessment:   30 y.o. G2P1001 postoperativeday # 2-stable  Rhonchi and wheezing in lungs which preceded Cesarean section, smoker  R/O bronchitis/pneumonia Placental pathology c/w chorioamnionitis    Plan:  1) Acute blood loss anemia - hemodynamically stable and asymptomatic - po iron and vitamins 2) --/--/O POS (03/29 2008) / 1.49 (01/17 2033) / Varicella immune  3) TDAP status   4)Bottle. Baby currently being fed via NG tube  5) Disposition-possible discharge tomorrow  Amair Shrout, CNM Addendum: CXR showed right hilar lymphadenopathy. Spoke with hospitalist Dr Maryjean Ka V who agreed with doing CT scan without contrast and recommended starting patient on Z-pak and Albuterol inhaler. Patient advised of results and indication for CT scan. Dalia Heading, CNM

## 2015-05-26 DIAGNOSIS — R590 Localized enlarged lymph nodes: Secondary | ICD-10-CM | POA: Diagnosis not present

## 2015-05-26 DIAGNOSIS — F17200 Nicotine dependence, unspecified, uncomplicated: Secondary | ICD-10-CM | POA: Diagnosis present

## 2015-05-26 MED ORDER — ALBUTEROL SULFATE HFA 108 (90 BASE) MCG/ACT IN AERS: 2.0000 | INHALATION_SPRAY | Freq: Four times a day (QID) | RESPIRATORY_TRACT | Status: DC

## 2015-05-26 MED ORDER — IBUPROFEN 600 MG PO TABS: 600.0000 mg | ORAL_TABLET | Freq: Four times a day (QID) | ORAL | Status: DC | PRN

## 2015-05-26 MED ORDER — OXYCODONE-ACETAMINOPHEN 5-325 MG PO TABS
1.0000 | ORAL_TABLET | Freq: Four times a day (QID) | ORAL | Status: DC | PRN
Start: 2015-05-26 — End: 2015-09-09

## 2015-05-26 MED ORDER — AZITHROMYCIN 250 MG PO TABS
ORAL_TABLET | ORAL | Status: AC
Start: 2015-05-27 — End: 2015-05-29

## 2015-05-26 NOTE — Progress Notes (Signed)
CNM states that pt does not need Influenza and TDaP vaccine. Reynold BowenSusan Paisley Tomica Arseneault, RN 05/26/2015 2:32 PM

## 2015-05-26 NOTE — Progress Notes (Signed)
Discharge instructions provided.  Pt and sig other verbalize understanding of all instructions and follow-up care.  Pt discharged to home at 1610 on 05/26/15 via wheelchair by RN. Reynold BowenSusan Paisley Charniece Venturino, RN 05/26/2015 4:41 PM

## 2015-05-26 NOTE — Clinical Social Work Note (Signed)
Clinical Social Work Assessment  Patient Details  Name: Annette Roberts MRN: [id number] Date of Birth: Aug 09, 1985  Date of referral:  05/26/15               Reason for consult:  Substance Use/ETOH Abuse, Other (Comment Required) (Drug Exposed Newborn)                Permission sought to share information with:    Permission granted to share information::     Name::        Agency::     Relationship::     Contact Information:     Housing/Transportation Living arrangements for the past 2 months:  Single Family Home Source of Information:  Patient Patient Interpreter Needed:  None Criminal Activity/Legal Involvement Pertinent to Current Situation/Hospitalization:  No - Comment as needed Significant Relationships:  Significant Other, Other(Comment) Lives with:  Significant Other, Other (Comment) Do you feel safe going back to the place where you live?  Yes Need for family participation in patient care:  No (Coment)  Care giving concerns: Mom's drug screen was positive for Methadone and Opiates. Baby's screen was positive for opiates.     Social Worker assessment / plan: (CSW) met with patient at bedside. Patient was alert and oriented, sitting up in bed. CSW introduced self and explained role of CSW department. Patient reports baby is currently in NICU, Mom delivered female infant Annette Roberts born 52 wks ealry, DOB 05/23/2013. Mom states she currently lives at [address] [place], [place] [address] with her boyfriend/FOB Annette Roberts DOB: 09/15/1980 and his father Annette Roberts. Mom also has a daughter who currently lives with her father (father has full custody). Daughter's name is Annette Roberts DOB: 09/10/2009 FOB: Annette Roberts DOB: 03/07/1985. DSS was involved due to report of drug abuse. Mom stated she chose to give father full custody during hearing, and case was closed out. Mom states she is not receiving treatment for substance abuse, patient denies having drug  abuse. Mom denies having depression/mental health/psych illness. According to mom, she has family support. Mom plans to bottle feed. Mom states " I already have a car seat and stroller. Mom reports she smokes about 8 cigarettes a day. Mom declined when offered substance abuse treatment information by CSW. Mom reports " I do not have a substance abuse problem, I took some medication for my toothache and for stomach pain". Mom was open to receiving community resources. CSW provided mom with [place], [place] and DSS information.   CSW filed CPS report.   Employment status:  Unemployed Forensic scientist:  Medicaid In [place] PT Recommendations:    Information / Referral to community resources:  Other (Comment Required) (WIC, [place], DSS)  Patient/Family's Response to care:  Patient declined substance abuse treatment information, but was open to receiving community resources.    Patient/Family's Understanding of and Emotional Response to Diagnosis, Current Treatment, and Prognosis:  Patient was pleasant throughout assessment and was very open with CSW about current situation. Mom seemed to be concerned with baby health and progress. Mom became very emotional when talking about newborn.   Emotional Assessment Appearance:  Appears stated age Attitude/Demeanor/Rapport:    Affect (typically observed):  Accepting, Calm, Stable, Pleasant, Hopeful Orientation:  Oriented to Self, Oriented to Place, Oriented to  Time, Oriented to Situation Alcohol / Substance use:  Illicit Drugs Psych involvement (Current and /or in the community):  No (Comment)  Discharge Needs  Concerns to be  addressed:  Care Coordination, Denies Needs/Concerns at this time, Substance Abuse Concerns Readmission within the last 30 days:  No Current discharge risk:  Substance Abuse Barriers to Discharge:  Active Substance Use, Continued Medical Work up (Baby born 6wks early, baby tested positive for opiates)   Annette Flesher,  LCSW 05/26/2015, 10:01 AM

## 2015-05-26 NOTE — Discharge Instructions (Signed)
° °Cesarean Delivery, Care After °Refer to this sheet in the next few weeks. These instructions provide you with information on caring for yourself after your procedure. Your health care provider may also give you specific instructions. Your treatment has been planned according to current medical practices, but problems sometimes occur. Call your health care provider if you have any problems or questions after you go home. °HOME CARE INSTRUCTIONS  °· If you have an On-Q pump, remove it on the 5th day after your surgery, by removing the dressing/bandage and pulling the pump out. Cover the site where the pump strings came out with a band-aid, as needed. °· Only take over-the-counter or prescription medications as directed by your health care provider. °· Do not drink alcohol, especially if you are breastfeeding or taking medication to relieve pain. °· Do not  smoke tobacco. °· Continue to use good perineal care. Good perineal care includes: °¨ Wiping your perineum from front to back. °¨ Keeping your perineum clean. °· Check your surgical cut (incision) daily for increased redness, drainage, swelling, or separation of skin. °· Shower and clean your incision gently with soap and water every day, by letting warm and soapy water run over the incision, and then pat it dry. If your health care provider says it is okay, leave the incision uncovered. Use a bandage (dressing) if the incision is draining fluid or appears irritated. If the adhesive strips across the incision do not fall off within 7 days, carefully peel them off, after a shower. °· Hug a pillow when coughing or sneezing until your incision is healed. This helps to relieve pain. °· Do not use tampons, douches or have sexual intercourse, until your health care provider says it is okay. °· Wear a well-fitting bra that provides breast support. °· Limit wearing support panties or control-top hose. °· Drink enough fluids to keep your urine clear or pale  yellow. °· Eat high-fiber foods such as whole grain cereals and breads, brown rice, beans, and fresh fruits and vegetables every day. These foods may help prevent or relieve constipation. °· Resume activities such as climbing stairs, driving, lifting, exercising, or traveling as directed by your health care provider. °· Try to have someone help you with your household activities and your newborn for at least a few days after you leave the hospital. °· Rest as much as possible. Try to rest or take a nap when your newborn is sleeping. °· Increase your activities gradually. °· Do not lift more than 15lbs until directed by a provider. °· Keep all of your scheduled postpartum appointments. It is very important to keep your scheduled follow-up appointments. At these appointments, your health care provider will be checking to make sure that you are healing physically and emotionally. °SEEK MEDICAL CARE IF:  °· You are passing large clots from your vagina. Save any clots to show your health care provider. °· You have a foul smelling discharge from your vagina. °· You have trouble urinating. °· You are urinating frequently. °· You have pain when you urinate. °· You have a change in your bowel movements. °· You have increasing redness, pain, or swelling near your incision. °· You have pus draining from your incision. °· Your incision is separating. °· You have painful, hard, or reddened breasts. °· You have a severe headache. °· You have blurred vision or see spots. °· You feel sad or depressed. °· You have thoughts of hurting yourself or your newborn. °· You have questions about your   care, the care of your newborn, or medications. °· You are dizzy or light-headed. °· You have a rash. °· You have pain, redness, or swelling at the site of the removed intravenous access (IV) tube. °· You have nausea or vomiting. °· You stopped breastfeeding and have not had a menstrual period within 12 weeks of stopping. °· You are not  breastfeeding and have not had a menstrual period within 12 weeks of delivery. °· You have a fever. °SEEK IMMEDIATE MEDICAL CARE IF: °· You have persistent pain. °· You have chest pain. °· You have shortness of breath. °· You faint. °· You have leg pain. °· You have stomach pain. °· Your vaginal bleeding saturates 2 or more sanitary pads in 1 hour. °MAKE SURE YOU:  °· Understand these instructions. °· Will watch your condition. °· Will get help right away if you are not doing well or get worse. °Document Released: 11/01/2001 Document Revised: 06/26/2013 Document Reviewed: 10/07/2011 °ExitCare® Patient Information ©2015 [employer]. This information is not intended to replace advice given to you by your health care provider. Make sure you discuss any questions you have with your health care provider. ° °Call your doctor for increased pain or vaginal bleeding, temperature above 100.4, depression, or concerns.  No strenuous activity or heavy lifting for 6 weeks.  No intercourse, tampons, douching, or enemas for 6 weeks.  No tub baths-showers only.  No driving for 2 weeks or while taking pain medications.  Continue prenatal vitamin and iron.  Keep incision clean and dry.  Call your doctor for incision concerns including redness, swelling, bleeding or drainage, or if begins to come apart.  Increase calories and fluids while breastfeeding. ° ° °

## 2015-05-27 ENCOUNTER — Telehealth: Payer: Self-pay | Admitting: *Deleted

## 2015-05-27 NOTE — Telephone Encounter (Signed)
-----   Message from Merwyn Katosavid B Simonds, MD sent at 05/26/2015  1:07 PM EDT ----- Please schedule follow up with any MD within next 2-3 weeks.   Problem: recent pregnancy, smoker, minimal mediastinal lymphadenopathy  Thanks, Theodoro Gristave

## 2015-05-27 NOTE — Telephone Encounter (Signed)
appt scheduled for 06/19/15 @11 :45am with VM. Location given. Nothing further needed

## 2015-06-19 ENCOUNTER — Encounter: Payer: Self-pay | Admitting: *Deleted

## 2015-06-19 ENCOUNTER — Inpatient Hospital Stay: Payer: Medicaid Other | Admitting: Internal Medicine

## 2015-07-10 ENCOUNTER — Encounter: Payer: Self-pay | Admitting: Emergency Medicine

## 2015-07-10 ENCOUNTER — Emergency Department
Admission: EM | Admit: 2015-07-10 | Discharge: 2015-07-10 | Disposition: A | Discharge status: Home / Self Care | Payer: Medicaid Other | Attending: Emergency Medicine | Admitting: Emergency Medicine

## 2015-07-10 DIAGNOSIS — Z79899 Other long term (current) drug therapy: Secondary | ICD-10-CM | POA: Diagnosis not present

## 2015-07-10 DIAGNOSIS — Y939 Activity, unspecified: Secondary | ICD-10-CM | POA: Insufficient documentation

## 2015-07-10 DIAGNOSIS — W57XXXA Bitten or stung by nonvenomous insect and other nonvenomous arthropods, initial encounter: Secondary | ICD-10-CM | POA: Insufficient documentation

## 2015-07-10 DIAGNOSIS — F329 Major depressive disorder, single episode, unspecified: Secondary | ICD-10-CM | POA: Insufficient documentation

## 2015-07-10 DIAGNOSIS — Y929 Unspecified place or not applicable: Secondary | ICD-10-CM | POA: Insufficient documentation

## 2015-07-10 DIAGNOSIS — Y999 Unspecified external cause status: Secondary | ICD-10-CM | POA: Insufficient documentation

## 2015-07-10 DIAGNOSIS — F17210 Nicotine dependence, cigarettes, uncomplicated: Secondary | ICD-10-CM | POA: Insufficient documentation

## 2015-07-10 DIAGNOSIS — S0086XA Insect bite (nonvenomous) of other part of head, initial encounter: Secondary | ICD-10-CM

## 2015-07-10 DIAGNOSIS — L03211 Cellulitis of face: Secondary | ICD-10-CM | POA: Insufficient documentation

## 2015-07-10 DIAGNOSIS — F1290 Cannabis use, unspecified, uncomplicated: Secondary | ICD-10-CM | POA: Insufficient documentation

## 2015-07-10 MED ORDER — TRAMADOL HCL 50 MG PO TABS: 50.0000 mg | ORAL_TABLET | Freq: Three times a day (TID) | ORAL | Status: DC | PRN

## 2015-07-10 MED ORDER — HYDROCODONE-ACETAMINOPHEN 5-325 MG PO TABS
1.0000 | ORAL_TABLET | Freq: Once | ORAL | Status: AC
  Administered 2015-07-10: 1 via ORAL
  Filled 2015-07-10: qty 1

## 2015-07-10 MED ORDER — CLINDAMYCIN HCL 300 MG PO CAPS: 300.0000 mg | ORAL_CAPSULE | Freq: Four times a day (QID) | ORAL | Status: DC

## 2015-07-10 MED ORDER — CLINDAMYCIN HCL 150 MG PO CAPS
300.0000 mg | ORAL_CAPSULE | Freq: Once | ORAL | Status: AC
  Administered 2015-07-10: 300 mg via ORAL
  Filled 2015-07-10: qty 2

## 2015-07-10 NOTE — ED Provider Notes (Signed)
[facility] Emergency Department Provider Note ____________________________________________  Time seen: 1610  I have reviewed the triage vital signs and the nursing notes.  HISTORY  Chief Complaint  Insect Bite and Facial Swelling  HPI Annette Roberts is a 30 y.o. female for evaluation of swelling to the left cheek. She believes she may been bitten by an insect or spider 2 nights ago. She does admit to squeezing what was initially a small pustule. She further admits to poking a needle into the center. She denies any interim fevers, chills, or sweats.She presents with moderate swelling to the left cheek.   Past Medical History  Diagnosis Date  . Current smoker   . Low grade squamous intraepithelial lesion (LGSIL) on cervical Pap smear   . Depression     does not want anyone incl  FOB to know  . Marijuana use   . Scoliosis   . Late prenatal care     @ 23 wks    Patient Active Problem List   Diagnosis Date Noted  . Hilar lymphadenopathy 05/26/2015  . Tobacco dependence 05/26/2015  . Postpartum care following cesarean delivery 05/22/2015    Past Surgical History  Procedure Laterality Date  . Cesarean section  09/10/2009    FTP/FITL, meconium  . Cesarean section  05/23/15    repeat  . Cesarean section N/A 05/22/2015    Procedure: CESAREAN SECTION;  Surgeon: Conard NovakStephen D Jackson, MD;  Location: [facility] ORS;  Service: Obstetrics;  Laterality: N/A;    Current Outpatient Rx  Name  Route  Sig  Dispense  Refill  . albuterol (PROVENTIL HFA;VENTOLIN HFA) 108 (90 Base) MCG/ACT inhaler   Inhalation   Inhale 2 puffs into the lungs every 6 (six) hours.         . clindamycin (CLEOCIN) 300 MG capsule   Oral   Take 1 capsule (300 mg total) by mouth 4 (four) times daily.   40 capsule   0   . ibuprofen (ADVIL,MOTRIN) 600 MG tablet   Oral   Take 1 tablet (600 mg total) by mouth every 6 (six) hours as needed for mild pain, moderate pain or cramping.   50 tablet   0   . oxyCODONE-acetaminophen (PERCOCET/ROXICET) 5-325 MG tablet   Oral   Take 1-2 tablets by mouth every 6 (six) hours as needed for severe pain (pain scale 7-10).   30 tablet   0   . Prenatal Multivit-Min-Fe-FA (PRENATAL VITAMINS PO)   Oral   Take 1 tablet by mouth daily.         . traMADol (ULTRAM) 50 MG tablet   Oral   Take 1 tablet (50 mg total) by mouth 3 (three) times daily as needed.   12 tablet   0     Allergies Review of patient's allergies indicates no known allergies.  No family history on file.  Social History Social History  Substance Use Topics  . Smoking status: Current Every Day Smoker -- 0.50 packs/day    Types: Cigarettes  . Smokeless tobacco: None  . Alcohol Use: No   Review of Systems  Constitutional: Negative for fever. Eyes: Negative for visual changes. ENT: Negative for sore throat. Gastrointestinal: Negative for abdominal pain, vomiting and diarrhea. Skin: Negative for rash. Left cheek cellulitis as above. Neurological: Negative for headaches, focal weakness or numbness. ____________________________________________  PHYSICAL EXAM:  VITAL SIGNS: ED Triage Vitals  Enc Vitals Group     BP --      Pulse --  Resp --      Temp --      Temp src --      SpO2 --      Weight --      Height --      Head Cir --      Peak Flow --      Pain Score --      Pain Loc --      Pain Edu? --      Excl. in GC? --    Constitutional: Alert and oriented. Well appearing and in no distress. Head: Normocephalic and atraumatic. Left cheek with moderate focal STS. No overlying erythema or induration. Small central scab noted with local erythema. No fluctuance or pointing abscess is appreciated.       Eyes: Conjunctivae are normal. PERRL. Normal extraocular movements      Ears: Canals clear. TMs intact bilaterally.   Nose: No congestion/rhinorrhea.   Mouth/Throat: Mucous membranes are moist. Uvula is midline. Tonsils are flat. No oral lesions  noted.    Neck: Supple. No thyromegaly. Hematological/Lymphatic/Immunological: No cervical lymphadenopathy. Cardiovascular: Normal rate, regular rhythm.  Respiratory: Normal respiratory effort.  Neurologic:  Normal gait without ataxia. Normal speech and language. No gross focal neurologic deficits are appreciated. Skin:  Skin is warm, dry and intact. No rash noted. ____________________________________________  PROCEDURES  Cleocin 300 mg PO Norco 5-325 mg PO ____________________________________________  INITIAL IMPRESSION / ASSESSMENT AND PLAN / ED COURSE  Afebrile patient with a local cellulitis to the left cheek after an insect bite. Local spread of infection after self-inflicted local trauma to the site. Patient will be discharged with prescriptions for clindamycin and tramadol for pain. She should continue to dose ibuprofen and apply warm compresses to promote healing. Return to the ED as needed for wound checks. ____________________________________________  FINAL CLINICAL IMPRESSION(S) / ED DIAGNOSES  Final diagnoses:  Insect bite of face with local reaction, initial encounter  Facial cellulitis     Lissa Hoard, PA-C 07/10/15 1656  Phineas Semen, MD 07/10/15 1747

## 2015-07-10 NOTE — ED Notes (Signed)
At discharge patient and significant other expressing upset over planned discharge medication (tramadol).  Emelda BrothersJenise Bacon, PA-C back to bedside to discuss discharge plan of care with patient.  Plan of care discussed.  Patient agrees to discharge plan.  Discharge home.

## 2015-07-10 NOTE — ED Notes (Signed)
Pt presents to ED with left cheek swelling. Pt states she thinks she was bitten by a spider yesterday.

## 2015-07-10 NOTE — ED Notes (Signed)
States possible insect bite to left side of face yesterday  Woke up with pain and swelling to face

## 2015-07-10 NOTE — Discharge Instructions (Signed)
Cellulitis Cellulitis is an infection of the skin and the tissue beneath it. The infected area is usually red and tender. Cellulitis occurs most often in the arms and lower legs.  CAUSES  Cellulitis is caused by bacteria that enter the skin through cracks or cuts in the skin. The most common types of bacteria that cause cellulitis are staphylococci and streptococci. SIGNS AND SYMPTOMS   Redness and warmth.  Swelling.  Tenderness or pain.  Fever. DIAGNOSIS  Your health care provider can usually determine what is wrong based on a physical exam. Blood tests may also be done. TREATMENT  Treatment usually involves taking an antibiotic medicine. HOME CARE INSTRUCTIONS   Take your antibiotic medicine as directed by your health care provider. Finish the antibiotic even if you start to feel better.  Keep the infected arm or leg elevated to reduce swelling.  Apply a warm cloth to the affected area up to 4 times per day to relieve pain.  Take medicines only as directed by your health care provider.  Keep all follow-up visits as directed by your health care provider. SEEK MEDICAL CARE IF:   You notice red streaks coming from the infected area.  Your red area gets larger or turns dark in color.  Your bone or joint underneath the infected area becomes painful after the skin has healed.  Your infection returns in the same area or another area.  You notice a swollen bump in the infected area.  You develop new symptoms.  You have a fever. SEEK IMMEDIATE MEDICAL CARE IF:   You feel very sleepy.  You develop vomiting or diarrhea.  You have a general ill feeling (malaise) with muscle aches and pains.   This information is not intended to replace advice given to you by your health care provider. Make sure you discuss any questions you have with your health care provider.   Document Released: 11/19/2004 Document Revised: 10/31/2014 Document Reviewed: 04/27/2011 Elsevier Interactive  Patient Education 2016 [employer]  IT sales professionalnsect Bite Mosquitoes, flies, fleas, bedbugs, and many other insects can bite. Insect bites are different from insect stings. A sting is when poison (venom) is injected into the skin. Insect bites can cause pain or itching for a few days, but they are usually not serious. Some insects can spread diseases to people through a bite. SYMPTOMS  Symptoms of an insect bite include:  Itching or pain in the bite area.  Redness and swelling in the bite area.  An open wound (skin ulcer). In many cases, symptoms last for 2-4 days.  DIAGNOSIS  This condition is usually diagnosed based on symptoms and a physical exam. TREATMENT  Treatment is usually not needed for an insect bite. Symptoms often go away on their own. Your health care provider may recommend creams or lotions to help reduce itching. Antibiotic medicines may be prescribed if the bite becomes infected. A tetanus shot may be given in some cases. If you develop an allergic reaction to an insect bite, your health care provider will prescribe medicines to treat the reaction (antihistamines). This is rare. HOME CARE INSTRUCTIONS  Do not scratch the bite area.  Keep the bite area clean and dry. Wash the bite area daily with soap and water as told by your health care provider.  If directed, applyice to the bite area.  Put ice in a plastic bag.  Place a towel between your skin and the bag.  Leave the ice on for 20 minutes, 2-3 times per day.  To help reduce itching and swelling, try applying a baking soda paste, cortisone cream, or calamine lotion to the bite area as told by your health care provider.  Apply or take over-the-counter and prescription medicines only as told by your health care provider.  If you were prescribed an antibiotic medicine, use it as told by your health care provider. Do not stop using the antibiotic even if your condition improves.  Keep all follow-up visits as told by your  health care provider. This is important. PREVENTION   Use insect repellent. The best insect repellents contain:  DEET, picaridin, oil of lemon eucalyptus (OLE), or IR3535.  Higher amounts of an active ingredient.  When you are outdoors, wear clothing that covers your arms and legs.  Avoid opening windows that do not have window screens. SEEK MEDICAL CARE IF:  You have increased redness, swelling, or pain in the bite area.  You have a fever. SEEK IMMEDIATE MEDICAL CARE IF:   You have joint pain.   You have fluid, blood, or pus coming from the bite area.  You have a headache or neck pain.  You have unusual weakness.  You have a rash.  You have chest pain or shortness of breath.  You have abdominal pain, nausea, or vomiting.  You feel unusually tired or sleepy.   This information is not intended to replace advice given to you by your health care provider. Make sure you discuss any questions you have with your health care provider.   Document Released: 03/19/2004 Document Revised: 10/31/2014 Document Reviewed: 06/27/2014 Elsevier Interactive Patient Education 2016 [employer].  Take the antibiotic as directed. Apply warm compresses to promote healing. Return to the ED as needed for any drainable abscess formation.

## 2015-07-10 NOTE — ED Notes (Signed)
AAOx3.  Skin warm and dry.  Ambualtes with easy and steady gait. NAD

## 2015-09-09 ENCOUNTER — Emergency Department
Admission: EM | Admit: 2015-09-09 | Discharge: 2015-09-09 | Disposition: A | Discharge status: Home / Self Care | Payer: Medicaid Other | Attending: Emergency Medicine | Admitting: Emergency Medicine

## 2015-09-09 DIAGNOSIS — Y939 Activity, unspecified: Secondary | ICD-10-CM | POA: Insufficient documentation

## 2015-09-09 DIAGNOSIS — K0889 Other specified disorders of teeth and supporting structures: Secondary | ICD-10-CM | POA: Diagnosis present

## 2015-09-09 DIAGNOSIS — Y929 Unspecified place or not applicable: Secondary | ICD-10-CM | POA: Insufficient documentation

## 2015-09-09 DIAGNOSIS — S025XXA Fracture of tooth (traumatic), initial encounter for closed fracture: Secondary | ICD-10-CM | POA: Diagnosis not present

## 2015-09-09 DIAGNOSIS — Y999 Unspecified external cause status: Secondary | ICD-10-CM | POA: Insufficient documentation

## 2015-09-09 DIAGNOSIS — F17210 Nicotine dependence, cigarettes, uncomplicated: Secondary | ICD-10-CM | POA: Diagnosis not present

## 2015-09-09 DIAGNOSIS — X58XXXA Exposure to other specified factors, initial encounter: Secondary | ICD-10-CM | POA: Insufficient documentation

## 2015-09-09 DIAGNOSIS — F329 Major depressive disorder, single episode, unspecified: Secondary | ICD-10-CM | POA: Insufficient documentation

## 2015-09-09 MED ORDER — IBUPROFEN 600 MG PO TABS: 600.0000 mg | ORAL_TABLET | Freq: Three times a day (TID) | ORAL | Status: DC | PRN

## 2015-09-09 MED ORDER — AMOXICILLIN 500 MG PO CAPS
500.0000 mg | ORAL_CAPSULE | Freq: Once | ORAL | Status: AC
  Administered 2015-09-09: 500 mg via ORAL
  Filled 2015-09-09: qty 1

## 2015-09-09 MED ORDER — OXYCODONE-ACETAMINOPHEN 5-325 MG PO TABS
1.0000 | ORAL_TABLET | Freq: Once | ORAL | Status: AC
  Administered 2015-09-09: 1 via ORAL
  Filled 2015-09-09: qty 1

## 2015-09-09 MED ORDER — OXYCODONE-ACETAMINOPHEN 5-325 MG PO TABS: 1.0000 | ORAL_TABLET | ORAL | Status: DC | PRN

## 2015-09-09 MED ORDER — LIDOCAINE VISCOUS 2 % MT SOLN
15.0000 mL | Freq: Once | OROMUCOSAL | Status: AC
  Administered 2015-09-09: 15 mL via OROMUCOSAL
  Filled 2015-09-09: qty 15

## 2015-09-09 MED ORDER — AMOXICILLIN 500 MG PO CAPS
500.0000 mg | ORAL_CAPSULE | Freq: Three times a day (TID) | ORAL | Status: DC
Start: 2015-09-09 — End: 2018-04-26

## 2015-09-09 NOTE — ED Notes (Signed)
Patient reports dental pain to 2 teeth on right lower jaw.

## 2015-09-09 NOTE — ED Provider Notes (Signed)
[facility] Emergency Department Provider Note   ____________________________________________  Time seen: Approximately 5:05 AM  I have reviewed the triage vital signs and the nursing notes.   HISTORY  Chief Complaint Dental Pain    HPI Annette Roberts is a 30 y.o. female who presents to the ED from home with a chief complaint of dental pain. Patient thinks she broke her back molar 2 nights ago. Has been taking ibuprofen "like crazy" and also placed an over-the-counter dental cement over the area. Complains of persistent pain. Denies associated fever, chills, swelling, chest pain, shortness of breath, abdominal pain, nausea, vomiting, diarrhea. Patient is 3 months postpartum but is not breast feeding. Denies recent travel or trauma. Nothing makes her symptoms better or worse.   Past Medical History  Diagnosis Date  . Current smoker   . Low grade squamous intraepithelial lesion (LGSIL) on cervical Pap smear   . Depression     does not want anyone incl  FOB to know  . Marijuana use   . Scoliosis   . Late prenatal care     @ 23 wks    Patient Active Problem List   Diagnosis Date Noted  . Hilar lymphadenopathy 05/26/2015  . Tobacco dependence 05/26/2015  . Postpartum care following cesarean delivery 05/22/2015    Past Surgical History  Procedure Laterality Date  . Cesarean section  09/10/2009    FTP/FITL, meconium  . Cesarean section  05/23/15    repeat  . Cesarean section N/A 05/22/2015    Procedure: CESAREAN SECTION;  Surgeon: Conard NovakStephen D Jackson, MD;  Location: [facility] ORS;  Service: Obstetrics;  Laterality: N/A;    Current Outpatient Rx  Name  Route  Sig  Dispense  Refill  . albuterol (PROVENTIL HFA;VENTOLIN HFA) 108 (90 Base) MCG/ACT inhaler   Inhalation   Inhale 2 puffs into the lungs every 6 (six) hours.         Marland Kitchen. amoxicillin (AMOXIL) 500 MG capsule   Oral   Take 1 capsule (500 mg total) by mouth 3 (three) times daily.   21 capsule    0   . clindamycin (CLEOCIN) 300 MG capsule   Oral   Take 1 capsule (300 mg total) by mouth 4 (four) times daily.   40 capsule   0   . ibuprofen (ADVIL,MOTRIN) 600 MG tablet   Oral   Take 1 tablet (600 mg total) by mouth every 8 (eight) hours as needed.   15 tablet   0   . oxyCODONE-acetaminophen (ROXICET) 5-325 MG tablet   Oral   Take 1 tablet by mouth every 4 (four) hours as needed for severe pain.   15 tablet   0   . Prenatal Multivit-Min-Fe-FA (PRENATAL VITAMINS PO)   Oral   Take 1 tablet by mouth daily.         . traMADol (ULTRAM) 50 MG tablet   Oral   Take 1 tablet (50 mg total) by mouth 3 (three) times daily as needed.   12 tablet   0     Allergies Review of patient's allergies indicates no known allergies.  No family history on file.  Social History Social History  Substance Use Topics  . Smoking status: Current Every Day Smoker -- 0.50 packs/day    Types: Cigarettes  . Smokeless tobacco: Not on file  . Alcohol Use: No    Review of Systems  Constitutional: No fever/chills. Eyes: No visual changes. ENT: Positive for dental pain. No sore  throat. Cardiovascular: Denies chest pain. Respiratory: Denies shortness of breath. Gastrointestinal: No abdominal pain.  No nausea, no vomiting.  No diarrhea.  No constipation. Genitourinary: Negative for dysuria. Musculoskeletal: Negative for back pain. Skin: Negative for rash. Neurological: Negative for headaches, focal weakness or numbness.  10-point ROS otherwise negative.  ____________________________________________   PHYSICAL EXAM:  VITAL SIGNS: ED Triage Vitals  Enc Vitals Group     BP 09/09/15 0042 136/97 mmHg     Pulse Rate 09/09/15 0042 74     Resp 09/09/15 0042 20     Temp 09/09/15 0042 98.2 F (36.8 C)     Temp Source 09/09/15 0042 Oral     SpO2 09/09/15 0042 100 %     Weight 09/09/15 0042 115 lb (52.164 kg)     Height 09/09/15 0042  (1.549 m)     Head Cir --      Peak Flow --       Pain Score 09/09/15 0045 10     Pain Loc --      Pain Edu? --      Excl. in GC? --     Constitutional: Alert and oriented. Well appearing and in no acute distress. Eyes: Conjunctivae are normal. PERRL. EOMI. Head: Atraumatic. Nose: No congestion/rhinnorhea. Mouth/Throat: Mucous membranes are moist.  Oropharynx non-erythematous.  Right lower posterior molar with dental cement in place. There is no associated intraoral or extraoral swelling. No gum abscess noted. Neck: No stridor.   Cardiovascular: Normal rate, regular rhythm. Grossly normal heart sounds.  Good peripheral circulation. Respiratory: Normal respiratory effort.  No retractions. Lungs CTAB. Gastrointestinal: Soft and nontender. No distention. No abdominal bruits. No CVA tenderness. Musculoskeletal: No lower extremity tenderness nor edema.  No joint effusions. Neurologic:  Normal speech and language. No gross focal neurologic deficits are appreciated. No gait instability. Skin:  Skin is warm, dry and intact. No rash noted. Psychiatric: Mood and affect are normal. Speech and behavior are normal.  ____________________________________________   LABS (all labs ordered are listed, but only abnormal results are displayed)  Labs Reviewed - No data to display ____________________________________________  EKG  None ____________________________________________  RADIOLOGY  None ____________________________________________   PROCEDURES  Procedure(s) performed: None  Procedures  Critical Care performed: No  ____________________________________________   INITIAL IMPRESSION / ASSESSMENT AND PLAN / ED COURSE  Pertinent labs & imaging results that were available during my care of the patient were reviewed by me and considered in my medical decision making (see chart for details).  30 year old female who presents with dentalgia secondary to broken molar. Will initiate antibiotic treatment with amoxicillin, analgesia  and dental follow-up. Strict return precautions given. Patient verbalizes understanding and agrees with plan of care. ____________________________________________   FINAL CLINICAL IMPRESSION(S) / ED DIAGNOSES  Final diagnoses:  Dentalgia  Fractured tooth, closed, initial encounter      NEW MEDICATIONS STARTED DURING THIS VISIT:  New Prescriptions   AMOXICILLIN (AMOXIL) 500 MG CAPSULE    Take 1 capsule (500 mg total) by mouth 3 (three) times daily.   IBUPROFEN (ADVIL,MOTRIN) 600 MG TABLET    Take 1 tablet (600 mg total) by mouth every 8 (eight) hours as needed.   OXYCODONE-ACETAMINOPHEN (ROXICET) 5-325 MG TABLET    Take 1 tablet by mouth every 4 (four) hours as needed for severe pain.     Note:  This document was prepared using Dragon voice recognition software and may include unintentional dictation errors.    Irean Hong, MD 09/09/15 [phone]

## 2015-09-09 NOTE — Discharge Instructions (Signed)
1. Take antibiotic as prescribed (Amoxicillin  3 times daily x 7 days). 2. Take pain medicines as needed (Motrin/Percocet #15). 3. Return to the ER for worsening symptoms, persistent vomiting, fever, difficulty breathing or other concerns.   Tooth Injuries Tooth injuries (tooth trauma) include cracked or broken teeth (fractures), teeth that have been moved out of place or dislodged (luxations), and knocked-out teeth (avulsions). A tooth injury often needs to be treated quickly to save the tooth. However, sometimes it is not possible to save a tooth after an injury, so the tooth may need to be removed (extracted). CAUSES Tooth injuries may be caused by any force that is strong enough to chip, break, dislodge, or knock out a tooth. Forces may be due to:  Sports injuries.  Falls.  Accidents.  Fights. RISK FACTORS You may be more likely to injure a tooth if you play a contact sport without using a mouthguard. SYMPTOMS A tooth that is forced into the gum may appear dislodged or moved out of position into the tooth socket. A fractured tooth may not be as obvious. Symptoms of a tooth injury include:  Pain, especially with chewing.  A loose tooth.  Bleeding in or around the tooth.  Swelling or bruising near the tooth.  Swelling or bruising of the lip over the injured tooth.  Increased sensitivity to heat and cold. DIAGNOSIS A tooth injury can be diagnosed with a medical history and a physical exam. You may also need dental X-rays to check for injuries to the root of the tooth. TREATMENT Treatment depends on the type of injury you have and how bad it is. Treatment may need to be done quickly to save your tooth. Possible treatments include:  Replacing a tooth fragment with a filling, cap, or hard, protective cover (crown). This may be an option for a chip or fracture that does not involve the inside of your tooth (pulp).  Having a procedure to repair the inside of the tooth (root  canal) and then having a crown placed on top. This may be done to treat a tooth fracture that involves the pulp.  Repositioning a dislodged tooth, then doing a root canal. The root canal usually needs to be done within a few days of the injury.  Replacing a knocked-out tooth in the socket, if possible, then doing a root canal a few weeks later.  Tooth extraction for a fracture that extends below your gumline or splits your tooth completely. HOME CARE INSTRUCTIONS  Take medicines only as directed by your dental provider or health care provider.  Keep all follow-up visits as directed by your dental provider or health care provider. This is important.  Do not eat or chew on very hard objects. These include ice cubes, pens, pencils, hard candy, and popcorn kernels.  Do not clench or grind your teeth. Tell your dental provider or health care provider if you grind your teeth while you sleep.  Apply ice to your mouth near the injured tooth as directed by your dental provider or health care provider.  Follow instructions about rinsing your mouth with salt water as directed by your dental provider or health care provider.  Do not use your teeth to open packages.  Always wear mouth protection when you play contact sports. SEEK MEDICAL CARE IF:  You continue to have tooth pain after a tooth injury.  Your tooth is sensitive to heat and cold.  You develop swelling near your injured tooth.  You have a fever.  You are unable to open your jaw.  You are drooling and it is getting worse.   This information is not intended to replace advice given to you by your health care provider. Make sure you discuss any questions you have with your health care provider.   Document Released: 11/07/2003 Document Revised: 06/26/2014 Document Reviewed: 02/05/2014 Elsevier Interactive Patient Education [employer] Elsevier Inc.

## 2016-07-15 IMAGING — CT CT CHEST W/ CM
1 series · 15 of 31 positions shown, 19 images · IV contrast (iopamidol)
Comparison: 05/25/2015

ADDENDUM:
Regarding the pulmonary nodules, no follow-up needed if patient is
low-risk (and has no known or suspected primary neoplasm).
Non-contrast chest CT can be considered in 12 months if patient is
high-risk. This recommendation follows the consensus statement:
Guidelines for Management of Incidental Pulmonary Nodules Detected
on CT Images:From the [HOSPITAL] 6952; published online
before print (10.1148/radiol.7638363657).
CLINICAL DATA: Cough with congestion for 10 days, adenopathy on
physical examination and chest radiograph. Patient is 2 days
postpartum with elevated white blood count. Patient smokes

EXAM:
CT CHEST WITH CONTRAST
TECHNIQUE: Multidetector CT imaging of the chest was performed during
intravenous contrast administration.
CONTRAST:  75mL DBKHFF-XUU IOPAMIDOL (DBKHFF-XUU) INJECTION 61%

[Series 2: routine chest with · axial · 0.63mm/px · z∈[-294,-19]mm · 15 of 61 slices shown, 19 images]
[im 3/61  mediastinal]
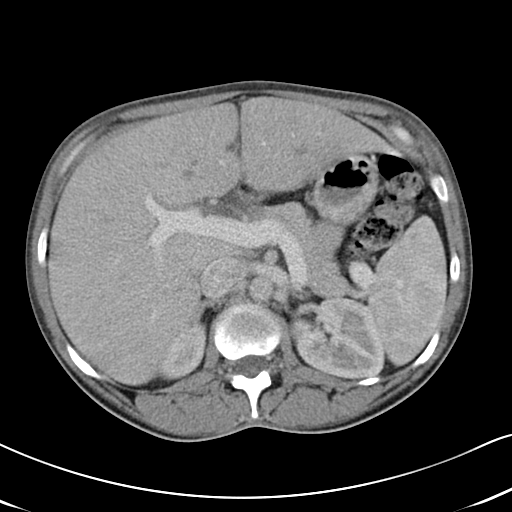
[im 3/61  lung]
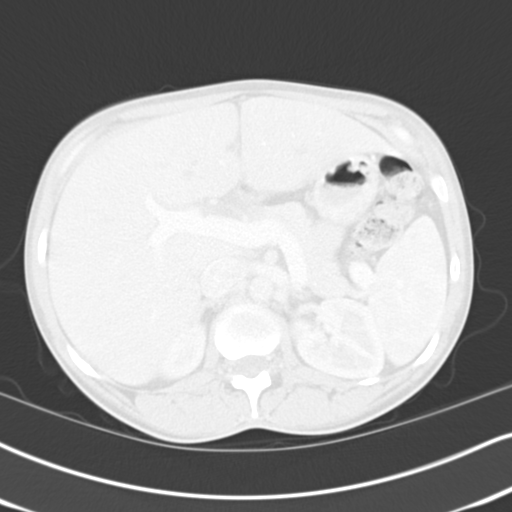
[im 7/61  lung]
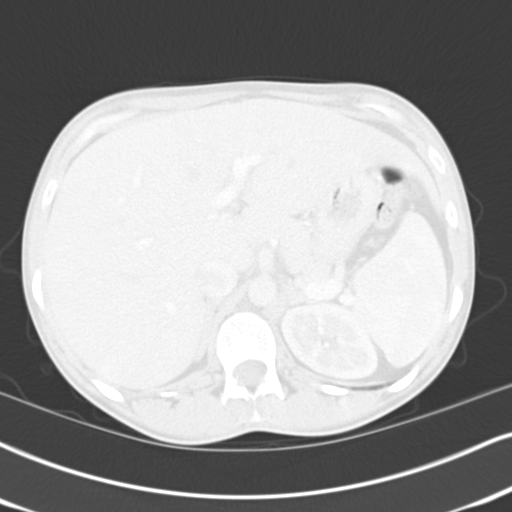
[im 12/61  lung]
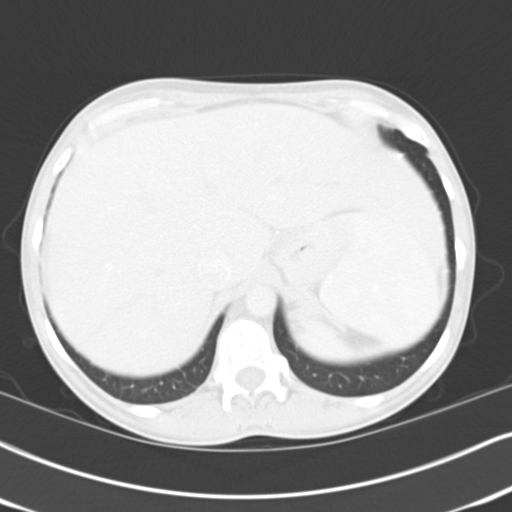
[im 14/61  lung]
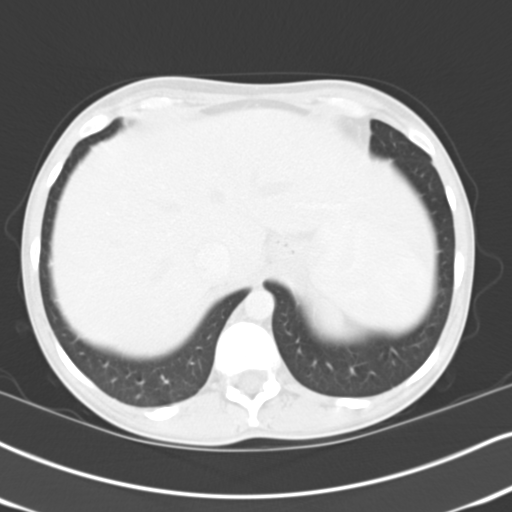
[im 18/61  mediastinal]
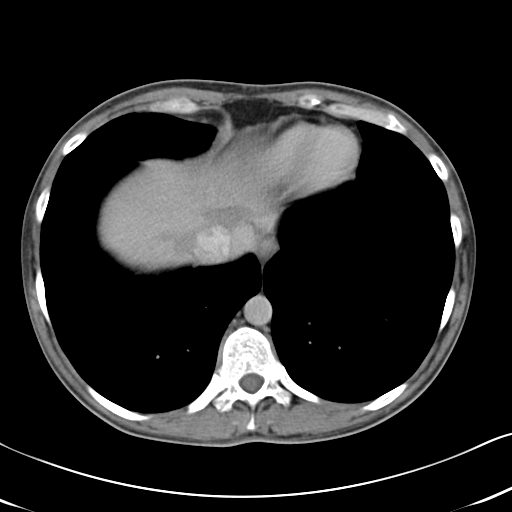
[im 18/61  lung]
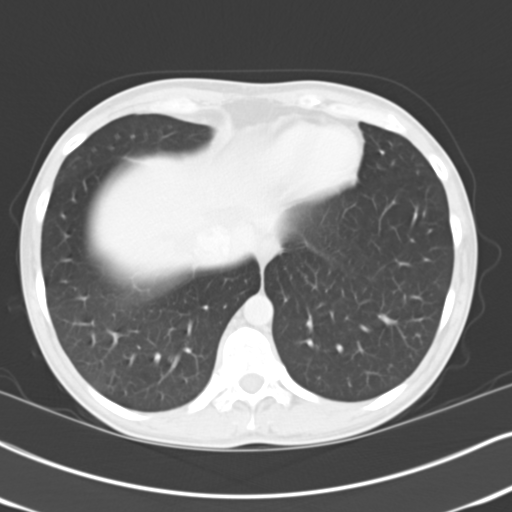
[im 23/61  lung]
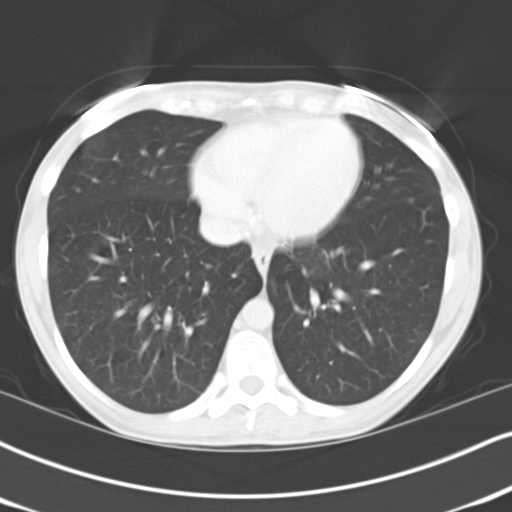
[im 27/61  lung]
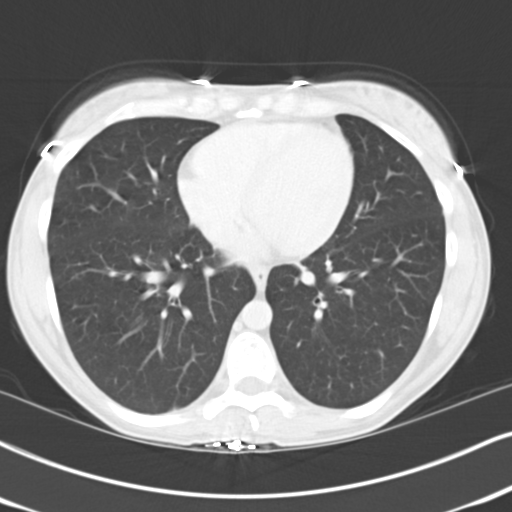
[im 32/61  lung]
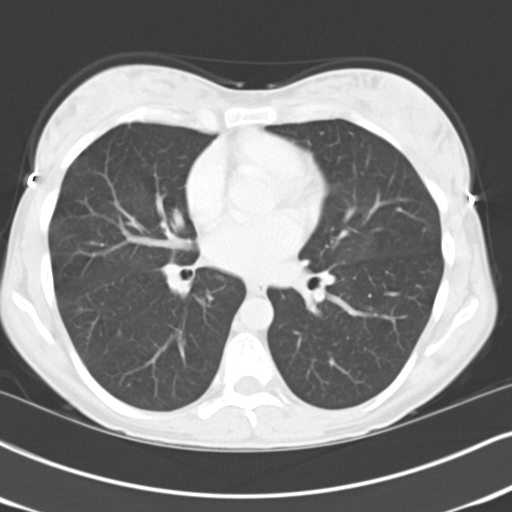
[im 34/61  mediastinal]
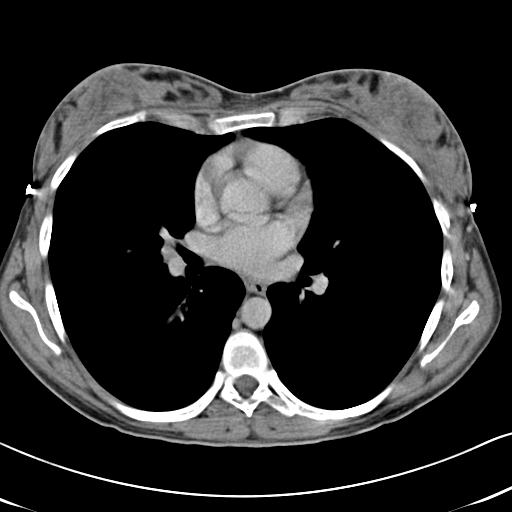
[im 34/61  lung]
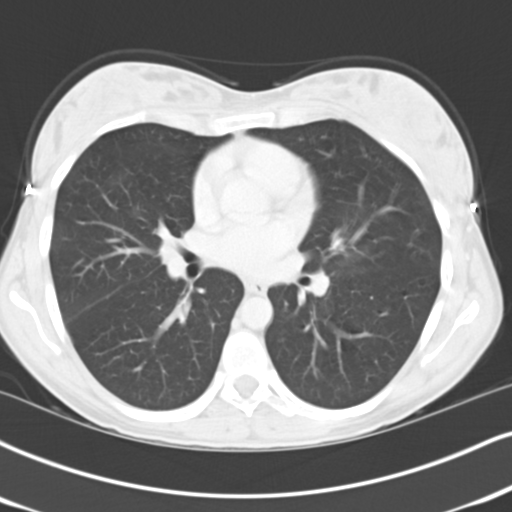
[im 37/61  lung]
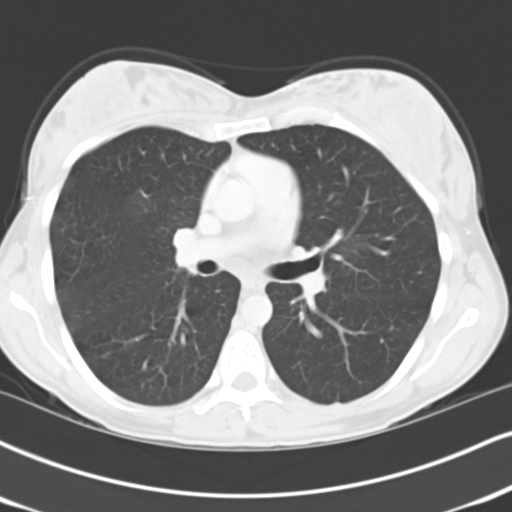
[im 41/61  lung]
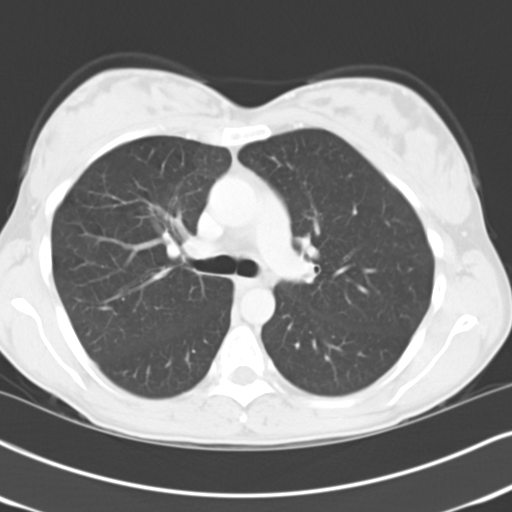
[im 45/61  lung]
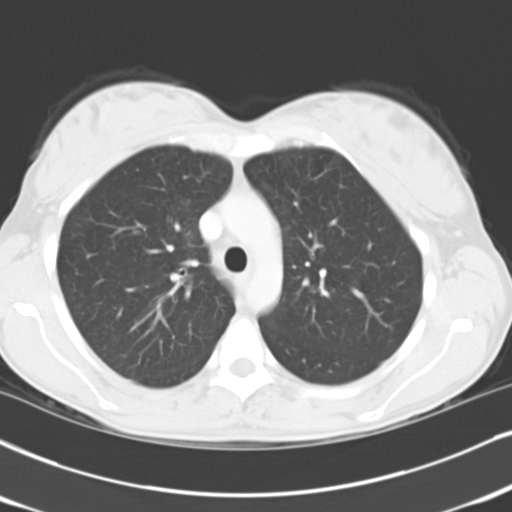
[im 49/61  mediastinal]
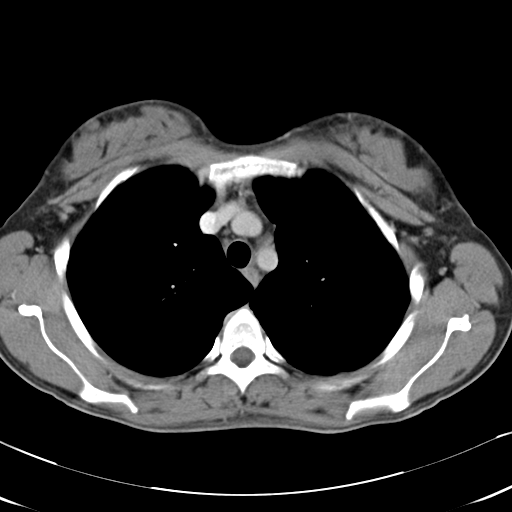
[im 49/61  lung]
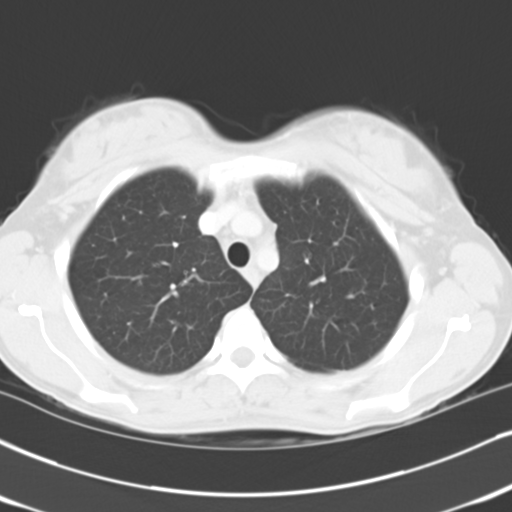
[im 54/61  lung]
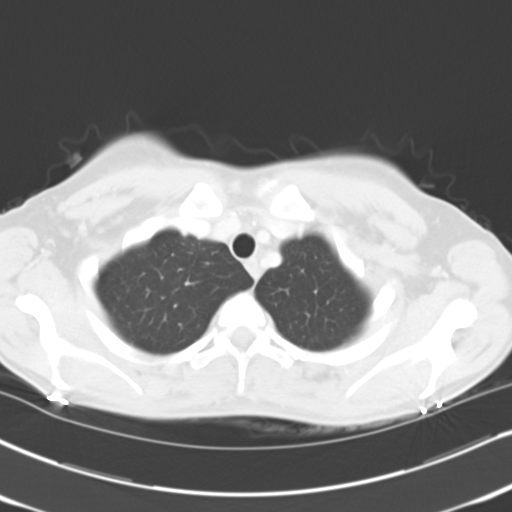
[im 58/61  lung]
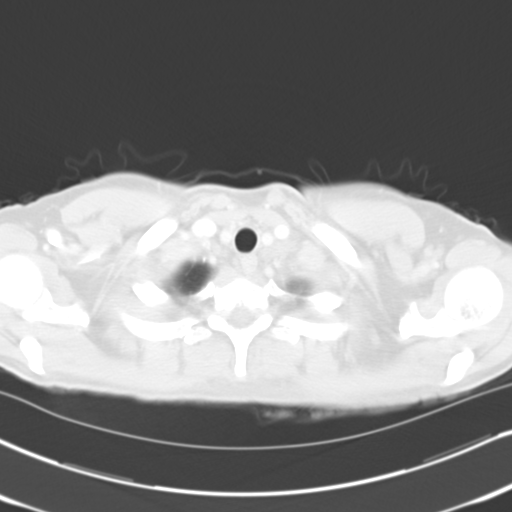

[15 of 31 positions shown; findings below may reference images not displayed]

FINDINGS: Mediastinum/Nodes: There are a few enlarged mediastinal lymph nodes.
There is a 9 mm pretracheal lymph node and a 10 mm AP window lymph
node. There is a 14 mm right hilar lymph node. Heart is normal. No
pericardial effusion.

Lungs/Pleura: No pleural effusion. No infiltrate or consolidation. 3
mm pulmonary nodule right lung base image 48. 3 mm pulmonary nodule
anterior left lower lobe image number 40.

Upper abdomen: No acute findings

Musculoskeletal: No acute abnormalities
IMPRESSION: Mildly enlarged right hilar and mediastinal lymph nodes presumably
reactive. Consider follow-up CT scan in 3 or 4 months to
re-evaluate.

## 2018-04-10 ENCOUNTER — Other Ambulatory Visit: Payer: Self-pay

## 2018-04-10 ENCOUNTER — Emergency Department
Admission: EM | Admit: 2018-04-10 | Discharge: 2018-04-10 | Disposition: A | Discharge status: Home / Self Care | Payer: Medicaid Other | Attending: Emergency Medicine | Admitting: Emergency Medicine

## 2018-04-10 DIAGNOSIS — J019 Acute sinusitis, unspecified: Secondary | ICD-10-CM | POA: Insufficient documentation

## 2018-04-10 DIAGNOSIS — F17210 Nicotine dependence, cigarettes, uncomplicated: Secondary | ICD-10-CM | POA: Insufficient documentation

## 2018-04-10 DIAGNOSIS — B9689 Other specified bacterial agents as the cause of diseases classified elsewhere: Secondary | ICD-10-CM

## 2018-04-10 DIAGNOSIS — J3489 Other specified disorders of nose and nasal sinuses: Secondary | ICD-10-CM | POA: Diagnosis present

## 2018-04-10 DIAGNOSIS — F329 Major depressive disorder, single episode, unspecified: Secondary | ICD-10-CM | POA: Diagnosis not present

## 2018-04-10 DIAGNOSIS — F1290 Cannabis use, unspecified, uncomplicated: Secondary | ICD-10-CM | POA: Insufficient documentation

## 2018-04-10 MED ORDER — AMOXICILLIN-POT CLAVULANATE 875-125 MG PO TABS: 1.0000 | ORAL_TABLET | Freq: Two times a day (BID) | ORAL | 0 refills | Status: DC

## 2018-04-10 MED ORDER — FLUTICASONE PROPIONATE 50 MCG/ACT NA SUSP
1.0000 | Freq: Two times a day (BID) | NASAL | 0 refills | Status: DC
Start: 2018-04-10 — End: 2018-04-26

## 2018-04-10 NOTE — ED Provider Notes (Signed)
[facility] Emergency Department Provider Note  ____________________________________________  Time seen: Approximately 10:17 PM  I have reviewed the triage vital signs and the nursing notes.   HISTORY  Chief Complaint Otalgia and URI    HPI Annette Roberts is a 33 y.o. female who presents the emergency department complaining of sinus pressure, sinus congestion bilateral ear pain worse on the left than right.  Patient reports that this is been ongoing for approximately a week.  Patient started with mild symptoms but main complaint is sinus pressure and left ear pain at this time.  Patient is tried multiple over-the-counter medications with no significant improvement.  She denies any fevers or chills, body aches, cough, shortness of breath, abdominal pain, nausea vomiting.    Past Medical History:  Diagnosis Date  . Current smoker   . Depression    does not want anyone incl  FOB to know  . Late prenatal care    @ 23 wks  . Low grade squamous intraepithelial lesion (LGSIL) on cervical Pap smear   . Marijuana use   . Scoliosis     Patient Active Problem List   Diagnosis Date Noted  . Hilar lymphadenopathy 05/26/2015  . Tobacco dependence 05/26/2015  . Postpartum care following cesarean delivery 05/22/2015    Past Surgical History:  Procedure Laterality Date  . CESAREAN SECTION  09/10/2009   FTP/FITL, meconium  . CESAREAN SECTION  05/23/15   repeat  . CESAREAN SECTION N/A 05/22/2015   Procedure: CESAREAN SECTION;  Surgeon: Conard Novak, MD;  Location: [facility] ORS;  Service: Obstetrics;  Laterality: N/A;    Prior to Admission medications   Medication Sig Start Date End Date Taking? Authorizing Provider  albuterol (PROVENTIL HFA;VENTOLIN HFA) 108 (90 Base) MCG/ACT inhaler Inhale 2 puffs into the lungs every 6 (six) hours. 05/26/15   Farrel Conners, CNM  amoxicillin (AMOXIL) 500 MG capsule Take 1 capsule (500 mg total) by mouth 3 (three) times daily.  09/09/15   Irean Hong, MD  amoxicillin-clavulanate (AUGMENTIN) 875-125 MG tablet Take 1 tablet by mouth 2 (two) times daily. 04/10/18   Roselin Wiemann, Delorise Royals, PA-C  clindamycin (CLEOCIN) 300 MG capsule Take 1 capsule (300 mg total) by mouth 4 (four) times daily. 07/10/15   Menshew, Charlesetta Ivory, PA-C  fluticasone (FLONASE) 50 MCG/ACT nasal spray Place 1 spray into both nostrils 2 (two) times daily. 04/10/18   Jeanette Moffatt, Delorise Royals, PA-C  ibuprofen (ADVIL,MOTRIN) 600 MG tablet Take 1 tablet (600 mg total) by mouth every 8 (eight) hours as needed. 09/09/15   Irean Hong, MD  oxyCODONE-acetaminophen (ROXICET) 5-325 MG tablet Take 1 tablet by mouth every 4 (four) hours as needed for severe pain. 09/09/15   Irean Hong, MD  Prenatal Multivit-Min-Fe-FA (PRENATAL VITAMINS PO) Take 1 tablet by mouth daily.    [provider]  traMADol (ULTRAM) 50 MG tablet Take 1 tablet (50 mg total) by mouth 3 (three) times daily as needed. 07/10/15   Menshew, Charlesetta Ivory, PA-C    Allergies Patient has no known allergies.  No family history on file.  Social History Social History   Tobacco Use  . Smoking status: Current Every Day Smoker    Packs/day: 0.50    Types: Cigarettes  Substance Use Topics  . Alcohol use: No    Alcohol/week: 0.0 standard drinks  . Drug use: Yes    Types: Marijuana     Review of Systems  Constitutional: No fever/chills Eyes: No visual  changes. No discharge ENT: Positive for sinus congestion, pressure and left ear pain Cardiovascular: no chest pain. Respiratory: no cough. No SOB. Gastrointestinal: No abdominal pain.  No nausea, no vomiting.  No diarrhea.  No constipation. Musculoskeletal: Negative for musculoskeletal pain. Skin: Negative for rash, abrasions, lacerations, ecchymosis. Neurological: Negative for headaches, focal weakness or numbness. 10-point ROS otherwise negative.  ____________________________________________   PHYSICAL EXAM:  VITAL SIGNS: ED  Triage Vitals  Enc Vitals Group     BP 04/10/18 2058 108/78     Pulse Rate 04/10/18 2058 (!) 116     Resp 04/10/18 2058 18     Temp 04/10/18 2058 98.2 F (36.8 C)     Temp Source 04/10/18 2058 Oral     SpO2 04/10/18 2058 97 %     Weight 04/10/18 2056 115 lb (52.2 kg)     Height 04/10/18 2056 5\' 1"  (1.549 m)     Head Circumference --      Peak Flow --      Pain Score 04/10/18 2056 7     Pain Loc --      Pain Edu? --      Excl. in GC? --      Constitutional: Alert and oriented. Well appearing and in no acute distress. Eyes: Conjunctivae are normal. PERRL. EOMI. Head: Atraumatic. ENT:      Ears: EACs unremarkable bilaterally.  TMs are bulging bilaterally, worse on the left than right.  No indication of otitis media.      Nose: Moderate purulent congestion/rhinnorhea.  Turbinates are erythematous and edematous.  Patient is tender percussion over the ethmoid and maxillary sinuses.      Mouth/Throat: Mucous membranes are moist.  Neck: No stridor.  Neck is supple full range of motion Hematological/Lymphatic/Immunilogical: No cervical lymphadenopathy. Cardiovascular: Normal rate, regular rhythm. Normal S1 and S2.  Good peripheral circulation. Respiratory: Normal respiratory effort without tachypnea or retractions. Lungs CTAB. Good air entry to the bases with no decreased or absent breath sounds. Musculoskeletal: Full range of motion to all extremities. No gross deformities appreciated. Neurologic:  Normal speech and language. No gross focal neurologic deficits are appreciated.  Skin:  Skin is warm, dry and intact. No rash noted. Psychiatric: Mood and affect are normal. Speech and behavior are normal. Patient exhibits appropriate insight and judgement.   ____________________________________________   LABS (all labs ordered are listed, but only abnormal results are displayed)  Labs Reviewed - No data to  display ____________________________________________  EKG   ____________________________________________  RADIOLOGY   No results found.  ____________________________________________    PROCEDURES  Procedure(s) performed:    Procedures    Medications - No data to display   ____________________________________________   INITIAL IMPRESSION / ASSESSMENT AND PLAN / ED COURSE  Pertinent labs & imaging results that were available during my care of the patient were reviewed by me and considered in my medical decision making (see chart for details).  Review of the [place] CSRS was performed in accordance of the [facility] prior to dispensing any controlled drugs.      Patient's diagnosis is consistent with bacterial sinusitis.  Patient presents emergency department complaining of sinus pressure, congestion and ear pain.  Patient differential includes viral URI, influenza, allergic rhinitis, sinusitis, otitis media.  Findings are most consistent with bacterial sinusitis and patient will be started on Augmentin and Flonase.  Follow-up primary care as needed..  Patient is given ED precautions to return to the ED for any worsening or new symptoms.  ____________________________________________  FINAL CLINICAL IMPRESSION(S) / ED DIAGNOSES  Final diagnoses:  Acute bacterial sinusitis      NEW MEDICATIONS STARTED DURING THIS VISIT:  ED Discharge Orders         Ordered    fluticasone (FLONASE) 50 MCG/ACT nasal spray  2 times daily     04/10/18 2219    amoxicillin-clavulanate (AUGMENTIN) 875-125 MG tablet  2 times daily     04/10/18 2219              This chart was dictated using voice recognition software/Dragon. Despite best efforts to proofread, errors can occur which can change the meaning. Any change was purely unintentional.    Racheal Patches, PA-C 04/10/18 2219    Phineas Semen, MD 04/10/18 2255

## 2018-04-10 NOTE — ED Triage Notes (Signed)
Reports left ear pain, congested and cough for "couple" days.

## 2018-04-26 ENCOUNTER — Ambulatory Visit (INDEPENDENT_AMBULATORY_CARE_PROVIDER_SITE_OTHER): Payer: Medicaid Other | Admitting: Maternal Newborn

## 2018-04-26 ENCOUNTER — Encounter: Payer: Self-pay | Admitting: Maternal Newborn

## 2018-04-26 ENCOUNTER — Other Ambulatory Visit (HOSPITAL_COMMUNITY)
Admission: RE | Admit: 2018-04-26 | Discharge: 2018-04-26 | Disposition: A | Discharge status: Home / Self Care | Payer: Medicaid Other | Source: Ambulatory Visit | Attending: Maternal Newborn | Admitting: Maternal Newborn

## 2018-04-26 VITALS — BP 104/60 | Wt 115.0 lb

## 2018-04-26 DIAGNOSIS — Z124 Encounter for screening for malignant neoplasm of cervix: Secondary | ICD-10-CM | POA: Diagnosis present

## 2018-04-26 DIAGNOSIS — O26891 Other specified pregnancy related conditions, first trimester: Secondary | ICD-10-CM

## 2018-04-26 DIAGNOSIS — O219 Vomiting of pregnancy, unspecified: Secondary | ICD-10-CM

## 2018-04-26 DIAGNOSIS — Z3480 Encounter for supervision of other normal pregnancy, unspecified trimester: Secondary | ICD-10-CM | POA: Diagnosis present

## 2018-04-26 DIAGNOSIS — Z87898 Personal history of other specified conditions: Secondary | ICD-10-CM

## 2018-04-26 DIAGNOSIS — O26899 Other specified pregnancy related conditions, unspecified trimester: Secondary | ICD-10-CM

## 2018-04-26 DIAGNOSIS — O99331 Smoking (tobacco) complicating pregnancy, first trimester: Secondary | ICD-10-CM | POA: Diagnosis not present

## 2018-04-26 DIAGNOSIS — Z369 Encounter for antenatal screening, unspecified: Secondary | ICD-10-CM

## 2018-04-26 DIAGNOSIS — Z3A08 8 weeks gestation of pregnancy: Secondary | ICD-10-CM | POA: Diagnosis not present

## 2018-04-26 DIAGNOSIS — F17210 Nicotine dependence, cigarettes, uncomplicated: Secondary | ICD-10-CM

## 2018-04-26 DIAGNOSIS — O0990 Supervision of high risk pregnancy, unspecified, unspecified trimester: Secondary | ICD-10-CM

## 2018-04-26 DIAGNOSIS — R12 Heartburn: Secondary | ICD-10-CM

## 2018-04-26 NOTE — Progress Notes (Signed)
NOB C/o nausea and vomiting, and heartburn  Flu shot today

## 2018-04-26 NOTE — Progress Notes (Signed)
04/26/2018   Chief Complaint: Amenorrhea, positive home pregnancy test, desires prenatal care.  Transfer of Care Patient: no  History of Present Illness: Ms. Schwaiger is a 33 y.o. [id number] at [redacted]w[redacted]d based on Patient's last menstrual period on 03/01/2018 (exact date), with an Estimated Date of Delivery: 12/06/2018, with the above CC.   Her periods were: regular periods every month, lasting 5-7 days. She has Positive signs or symptoms of nausea/vomiting of pregnancy. 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   Review of Systems  Constitutional: Negative.   HENT: Positive for congestion and sore throat.   Eyes: Negative.   Respiratory: Positive for cough.   Cardiovascular: Negative for chest pain and palpitations.  Gastrointestinal: Positive for heartburn, nausea and vomiting. Negative for abdominal pain.  Genitourinary: Positive for frequency.  Musculoskeletal: Negative.   Skin: Negative.   Neurological: Negative.   Endo/Heme/Allergies: Negative.   Psychiatric/Behavioral: Negative.    ROS: Review of systems was otherwise negative, except as stated in the above HPI.  OBGYN History: As per HPI. OB History  Gravida Para Term Preterm AB Living  3 2 1 1   2   SAB TAB Ectopic Multiple Live Births          2    # Outcome Date GA Lbr Len/2nd Weight Sex Delivery Anes PTL Lv  3 Current           2 Preterm 05/23/15 [redacted]w[redacted]d  4 lb (1.814 kg) M CS-LTranv   LIV  1 Term 09/10/09   6 lb 7 oz (2.92 kg) F CS-LTranv Spinal N LIV     Complications: Fetal Intolerance, Failure to Progress in First Stage    Any issues with any prior  pregnancies: yes, PPROM and  preterm delivery with G2. Has had two Cesarean deliveries. Any prior children are healthy, doing well, without any problems or issues: yes History of pap smears: Does not recall when she last had a Pap History of STIs: No   Past Medical History: Past Medical History:  Diagnosis Date  . Current smoker   . Depression    does not want anyone incl  FOB to know  . Late prenatal care    @ 23 wks  . Low grade squamous intraepithelial lesion (LGSIL) on cervical Pap smear   . Marijuana use   . Scoliosis     Past Surgical History: Past Surgical History:  Procedure Laterality Date  . CESAREAN SECTION  09/10/2009   FTP/FITL, meconium  . CESAREAN SECTION  05/23/15   repeat  . CESAREAN SECTION N/A 05/22/2015   Procedure: CESAREAN SECTION;  Surgeon: Conard Novak, MD;  Location: [facility] ORS;  Service: Obstetrics;  Laterality: N/A;    Family History:  History reviewed. No pertinent family history.  She denies any female cancers, bleeding or blood clotting disorders.  She denies any history of intellectual disability, birth defects or genetic disorders in her or the FOB's history  Social History:  Social History   Socioeconomic History  . Marital status: Single    Spouse name: Not on file  . Number of children: Not on file  . Years of education: Not on  file  . Highest education level: Not on file  Occupational History  . Not on file  Social Needs  . Financial resource strain: Not on file  . Food insecurity:    Worry: Not on file    Inability: Not on file  . Transportation needs:    Medical: Not on file    Non-medical: Not on file  Tobacco Use  . Smoking status: Current Every Day Smoker    Packs/day: 0.25    Types: Cigarettes  . Smokeless tobacco: Never Used  Substance and Sexual Activity  . Alcohol use: No    Alcohol/week: 0.0 standard drinks  . Drug use: Yes    Types: Marijuana  . Sexual activity: Yes    Birth control/protection: None    Lifestyle  . Physical activity:    Days per week: Not on file    Minutes per session: Not on file  . Stress: Not on file  Relationships  . Social connections:    Talks on phone: Not on file    Gets together: Not on file    Attends religious service: Not on file    Active member of club or organization: Not on file    Attends meetings of clubs or organizations: Not on file    Relationship status: Not on file  . Intimate partner violence:    Fear of current or ex partner: Not on file    Emotionally abused: Not on file    Physically abused: Not on file    Forced sexual activity: Not on file  Other Topics Concern  . Not on file  Social History Narrative  . Not on file   Any cats in the household: no Domestic violence screening is negative.  Allergy: No Known Allergies  Current Outpatient Medications:  Current Outpatient Medications:  .  Prenatal Multivit-Min-Fe-FA (PRENATAL VITAMINS PO), Take 1 tablet by mouth daily., Disp: , Rfl:   Physical Exam:   BP 104/60   Wt 115 lb (52.2 kg)   LMP 03/01/2018 (Exact Date)   BMI 21.73 kg/m  Body mass index is 21.73 kg/m. Constitutional: Well nourished, well developed female in no acute distress.  Neck:  Supple, normal appearance, and no thyromegaly  Cardiovascular: S1, S2 normal, no murmur, rub or gallop, regular rate and rhythm Respiratory:  Clear to auscultation bilaterally. Normal respiratory effort Abdomen: no masses, hernias; diffusely non tender to palpation, non distended Breasts: patient declines to have breast exam. Neuro/Psych:  Normal mood and affect.  Skin:  Warm and dry.  Lymphatic:  No inguinal lymphadenopathy.   Pelvic exam: is not limited by body habitus External genitalia, Bartholin's glands, Urethra, Skene's glands: within normal limits Vagina: within normal limits and with no blood in the vault  Cervix: normal appearing cervix without discharge or lesions, closed/long/high Uterus:  enlarged, c/w early  pregnancy Adnexa:  negative for fullness and mass and positive for: mild tenderness on the right side  Assessment: Ms. Odonnell is a 33 y.o. [id number] [redacted]w[redacted]d based on Patient's last menstrual period was 03/01/2018 (exact date). with an Estimated Date of Delivery: 12/06/18, presenting for prenatal care.  Plan:  1) Avoid alcoholic beverages. 2) Patient encouraged not to smoke. She is currently smoking around 5-7 cigarettes daily. 3) Discontinue the use of all non-medicinal drugs and chemicals.  4) Take prenatal vitamins daily.  5) Seatbelt use advised 6) Nutrition, food safety (fish, cheese advisories, and high nitrite foods) and exercise discussed. 7) Hospital and practice style delivering at [facility] discussed  8) Patient is asked about travel to areas at risk for the Zika virus, and counseled to avoid travel and exposure to mosquitoes or people who may have themselves been exposed to the virus.  9) Genetic Screening, such as with 1st Trimester Screening, cell free fetal DNA, AFP testing, and Ultrasound, as well as with amniocentesis and CVS as appropriate, is discussed with patient. She plans to have genetic testing this pregnancy. 10) NOB labs and Pap today. 11) Dating scan ordered. 12) She will accept Makena at 16 weeks. 13) Rx for Diclegis and Pepcid.  Problem list reviewed and updated.  Return in about 1 day (around 04/27/2018) for ROB and dating scan.  Marcelyn Bruins, CNM [facility], [facility] 04/26/2018

## 2018-04-27 ENCOUNTER — Encounter: Payer: Self-pay | Admitting: Maternal Newborn

## 2018-04-27 ENCOUNTER — Ambulatory Visit (INDEPENDENT_AMBULATORY_CARE_PROVIDER_SITE_OTHER): Payer: Medicaid Other

## 2018-04-27 ENCOUNTER — Other Ambulatory Visit: Payer: Self-pay | Admitting: Maternal Newborn

## 2018-04-27 ENCOUNTER — Ambulatory Visit (INDEPENDENT_AMBULATORY_CARE_PROVIDER_SITE_OTHER): Payer: Medicaid Other | Admitting: Advanced Practice Midwife

## 2018-04-27 ENCOUNTER — Encounter: Payer: Self-pay | Admitting: Advanced Practice Midwife

## 2018-04-27 VITALS — BP 106/68 | Wt 115.0 lb

## 2018-04-27 DIAGNOSIS — Z369 Encounter for antenatal screening, unspecified: Secondary | ICD-10-CM

## 2018-04-27 DIAGNOSIS — Z3687 Encounter for antenatal screening for uncertain dates: Secondary | ICD-10-CM | POA: Diagnosis not present

## 2018-04-27 DIAGNOSIS — Z23 Encounter for immunization: Secondary | ICD-10-CM | POA: Diagnosis not present

## 2018-04-27 DIAGNOSIS — Z3A01 Less than 8 weeks gestation of pregnancy: Secondary | ICD-10-CM

## 2018-04-27 DIAGNOSIS — Z3480 Encounter for supervision of other normal pregnancy, unspecified trimester: Secondary | ICD-10-CM

## 2018-04-27 DIAGNOSIS — Z87898 Personal history of other specified conditions: Secondary | ICD-10-CM | POA: Insufficient documentation

## 2018-04-27 LAB — RPR+RH+ABO+RUB AB+AB SCR+CB...
HEP B S AG: NEGATIVE
HIV SCREEN 4TH GENERATION: NONREACTIVE
Hematocrit: 33.5 % — ABNORMAL LOW (ref 34.0–46.6)
Hemoglobin: 11.6 g/dL (ref 11.1–15.9)
MCH: 29.8 pg (ref 26.6–33.0)
MCHC: 34.6 g/dL (ref 31.5–35.7)
MCV: 86 fL (ref 79–97)
PLATELETS: 274 10*3/uL (ref 150–450)
RBC: 3.89 x10E6/uL (ref 3.77–5.28)
RDW: 12.9 % (ref 11.7–15.4)
RPR: NONREACTIVE
Rh Factor: POSITIVE
Rubella Antibodies, IGG: 1.69 index (ref >0.99)
Varicella zoster IgG: 1798 index (ref >165)
WBC: 10.1 10*3/uL (ref 3.4–10.8)

## 2018-04-27 LAB — AB SCR+ANTIBODY ID: Antibody Screen: POSITIVE — AB

## 2018-04-27 MED ORDER — DICLEGIS 10-10 MG PO TBEC: 2.0000 | DELAYED_RELEASE_TABLET | Freq: Every day | ORAL | 5 refills | Status: DC

## 2018-04-27 MED ORDER — FAMOTIDINE 20 MG PO TABS: 20.0000 mg | ORAL_TABLET | Freq: Two times a day (BID) | ORAL | 3 refills | Status: DC

## 2018-04-27 NOTE — Progress Notes (Signed)
Dating scan today.  

## 2018-04-27 NOTE — Patient Instructions (Signed)
Exercise During Pregnancy For people of all ages, exercise is an important part of being healthy. Exercise improves heart and lung function and helps to maintain strength, flexibility, and a healthy body weight. Exercise also boosts energy levels and elevates mood. For most women, maintaining an exercise routine throughout pregnancy is recommended. It is only on rare occasions and with certain medical conditions or pregnancy complications that women may be asked to limit or avoid exercise during pregnancy. What are some other benefits to exercising during pregnancy? Along with maintaining strength and flexibility, exercising throughout pregnancy can help to:  Keep strength in muscles that are very important during labor and childbirth.  Decrease low back pain during pregnancy.  Decrease the risk of developing gestational diabetes mellitus (GDM).  Improve blood sugar (glucose) control for women who have GDM.  Decrease the risk of developing preeclampsia. This is a serious condition that causes high blood pressure along with other symptoms, such as swelling and headaches.  Decrease the risk of cesarean delivery.  Speed up the recovery after giving birth. How often should I exercise? Unless your health care provider gives you different instructions, you should try to exercise on most days or all days of the week. In general, try to exercise with moderate intensity for about 150 minutes per week. This can be spread out across several days, such as exercising for 30 minutes per day on 5 days of each week. You can tell that you are exercising at a moderate intensity if you have a higher heart rate and faster breathing, but you are still able to hold a conversation. What types of moderate-intensity exercise are recommended during pregnancy? There are many types of exercise that are safe for you to do during pregnancy. Unless your health care provider gives you different instructions, do a variety of  exercises that safely increase your heart and breathing (cardiopulmonary) rates and help you to build and maintain muscle strength (strength training). You should always be able to talk in full sentences while exercising during pregnancy. Some examples of exercising that is safe to do during pregnancy include:  Brisk walking or hiking.  Swimming.  Water aerobics.  Riding a stationary bike.  Strength training.  Modified yoga or Pilates. Tell your instructor that you are pregnant. Avoid overstretching and avoid lying on your back for long periods of time.  Running or jogging. Only choose this type of exercise if: ? You ran or jogged regularly before your pregnancy. ? You can run or jog and still talk in complete sentences. What types of exercise should I not do during pregnancy? Depending on your level of fitness and whether you exercised regularly before your pregnancy, you may be advised to limit vigorous-intensity exercise during your pregnancy. You can tell that you are exercising at a vigorous intensity if you are breathing much harder and faster and cannot hold a conversation while exercising. Some examples of exercising that you should avoid during pregnancy include:  Contact sports.  Activities that place you at risk for falling on or being hit in the belly, such as downhill skiing, water skiing, surfing, rock climbing, cycling, gymnastics, and horseback riding.  Scuba diving.  Sky diving.  Yoga or Pilates in a room that is heated to extreme temperatures ("hot yoga" or "hot Pilates").  Jogging or running, unless you ran or jogged regularly before your pregnancy. While jogging or running, you should always be able to talk in full sentences. Do not run or jog so vigorously that you   are unable to have a conversation.  If you are not used to exercising at elevation (more than 6,000 feet above sea level), do not do so during your pregnancy. When should I avoid exercising during  pregnancy? Certain medical conditions can make it unsafe to exercise during pregnancy, or they may increase your risk of miscarriage or early labor and birth. Some of these conditions include:  Some types of heart disease.  Some types of lung disease.  Placenta previa. This is when the placenta partially or completely covers the opening of the uterus (cervix).  Frequent bleeding from the vagina during your pregnancy.  Incompetent cervix. This is when your cervix does not remain as tightly closed during pregnancy as it should.  Premature labor.  Ruptured membranes. This is when the protective sac (amniotic sac) opens up and amniotic fluid leaks from your vagina.  Severely low blood count (anemia).  Preeclampsia or pregnancy-caused high blood pressure.  Carrying more than one baby (multiple gestation) and having an additional risk of early labor.  Poorly controlled diabetes.  Being severely underweight or severely overweight.  Intrauterine growth restriction. This is when your baby's growth and development during pregnancy are slower than expected.  Other medical conditions. Ask your health care provider if any apply to you. What else should I know about exercising during pregnancy? You should take these precautions while exercising during pregnancy:  Avoid overheating. ? Wear loose-fitting, breathable clothes. ? Do not exercise in very high temperatures.  Avoid dehydration. Drink enough water before, during, and after exercise to keep your urine clear or pale yellow.  Avoid overstretching. Because of hormone changes during pregnancy, it is easy to overstretch muscles, tendons, and ligaments during pregnancy.  Start slowly and ask your health care provider to recommend types of exercise that are safe for you, if exercising regularly is new for you. Pregnancy is not a time for exercising to lose weight. When should I seek medical care? You should stop exercising and call your  health care provider if you have any unusual symptoms, such as:  Mild uterine contractions or abdominal cramping.  Dizziness that does not improve with rest. When should I seek immediate medical care? You should stop exercising and call your local emergency services (911 in the [place] if you have any unusual symptoms, such as:  Sudden, severe pain in your low back or your belly.  Uterine contractions or abdominal cramping that do not improve with rest.  Chest pain.  Bleeding or fluid leaking from your vagina.  Shortness of breath. This information is not intended to replace advice given to you by your health care provider. Make sure you discuss any questions you have with your health care provider. Document Released: 02/09/2005 Document Revised: 07/10/2015 Document Reviewed: 04/19/2014 Elsevier Interactive Patient Education  2019 [employer] Eating Plan for Pregnant Women While you are pregnant, your body requires additional nutrition to help support your growing baby. You also have a higher need for some vitamins and minerals, such as folic acid, calcium, iron, and vitamin D. Eating a healthy, well-balanced diet is very important for your health and your baby's health. Your need for extra calories varies for the three 3-month segments of your pregnancy (trimesters). For most women, it is recommended to consume:  150 extra calories a day during the first trimester.  300 extra calories a day during the second trimester.  300 extra calories a day during the third trimester. What are tips for following this plan?   Do   not try to lose weight or go on a diet during pregnancy.  Limit your overall intake of foods that have "empty calories." These are foods that have little nutritional value, such as sweets, desserts, candies, and sugar-sweetened beverages.  Eat a variety of foods (especially fruits and vegetables) to get a full range of vitamins and minerals.  Take a prenatal vitamin  to help meet your additional vitamin and mineral needs during pregnancy, specifically for folic acid, iron, calcium, and vitamin D.  Remember to stay active. Ask your health care provider what types of exercise and activities are safe for you.  Practice good food safety and cleanliness. Wash your hands before you eat and after you prepare raw meat. Wash all fruits and vegetables well before peeling or eating. Taking these actions can help to prevent food-borne illnesses that can be very dangerous to your baby, such as listeriosis. Ask your health care provider for more information about listeriosis. What does 150 extra calories look like? Healthy options that provide 150 extra calories each day could be any of the following:  6-8 oz (170-230 g) of plain low-fat yogurt with  cup of berries.  1 apple with 2 teaspoons (11 g) of peanut butter.  Cut-up vegetables with  cup (60 g) of hummus.  8 oz (230 mL) or 1 cup of low-fat chocolate milk.  1 stick of string cheese with 1 medium orange.  1 peanut butter and jelly sandwich that is made with one slice of whole-wheat bread and 1 tsp (5 g) of peanut butter. For 300 extra calories, you could eat two of those healthy options each day. What is a healthy amount of weight to gain? The right amount of weight gain for you is based on your BMI before you became pregnant. If your BMI:  Was less than 18 (underweight), you should gain 28-40 lb (13-18 kg).  Was 18-24.9 (normal), you should gain 25-35 lb (11-16 kg).  Was 25-29.9 (overweight), you should gain 15-25 lb (7-11 kg).  Was 30 or greater (obese), you should gain 11-20 lb (5-9 kg). What if I am having twins or multiples? Generally, if you are carrying twins or multiples:  You may need to eat 300-600 extra calories a day.  The recommended range for total weight gain is 25-54 lb (11-25 kg), depending on your BMI before pregnancy.  Talk with your health care provider to find out about  nutritional needs, weight gain, and exercise that is right for you. What foods can I eat?  Grains All grains. Choose whole grains, such as whole-wheat bread, oatmeal, or brown rice. Vegetables All vegetables. Eat a variety of colors and types of vegetables. Remember to wash your vegetables well before peeling or eating. Fruits All fruits. Eat a variety of colors and types of fruit. Remember to wash your fruits well before peeling or eating. Meats and other protein foods Lean meats, including chicken, turkey, fish, and lean cuts of beef, veal, or pork. If you eat fish or seafood, choose options that are higher in omega-3 fatty acids and lower in mercury, such as salmon, herring, mussels, trout, sardines, pollock, shrimp, crab, and lobster. Tofu. Tempeh. Beans. Eggs. Peanut butter and other nut butters. Make sure that all meats, poultry, and eggs are cooked to food-safe temperatures or "well-done." Two or more servings of fish are recommended each week in order to get the most benefits from omega-3 fatty acids that are found in seafood. Choose fish that are lower in mercury. You can   find more information online:  www.fda.gov Dairy Pasteurized milk and milk alternatives (such as almond milk). Pasteurized yogurt and pasteurized cheese. Cottage cheese. Sour cream. Beverages Water. Juices that contain 100% fruit juice or vegetable juice. Caffeine-free teas and decaffeinated coffee. Drinks that contain caffeine are okay to drink, but it is better to avoid caffeine. Keep your total caffeine intake to less than 200 mg each day (which is 12 oz or 355 mL of coffee, tea, or soda) or the limit as told by your health care provider. Fats and oils Fats and oils are okay to include in moderation. Sweets and desserts Sweets and desserts are okay to include in moderation. Seasoning and other foods All pasteurized condiments. The items listed above may not be a complete list of recommended foods and beverages.  Contact your dietitian for more options. What foods are not recommended? Vegetables Raw (unpasteurized) vegetable juices. Fruits Unpasteurized fruit juices. Meats and other protein foods Lunch meats, bologna, hot dogs, or other deli meats. (If you must eat those meats, reheat them until they are steaming hot.) Refrigerated pat, meat spreads from a meat counter, smoked seafood that is found in the refrigerated section of a store. Raw or undercooked meats, poultry, and eggs. Raw fish, such as sushi or sashimi. Fish that have high mercury content, such as tilefish, shark, swordfish, and king mackerel. To learn more about mercury in fish, talk with your health care provider or look for online resources, such as:  www.fda.gov Dairy Raw (unpasteurized) milk and any foods that have raw milk in them. Soft cheeses, such as feta, queso blanco, queso fresco, Brie, Camembert cheeses, blue-veined cheeses, and Panela cheese (unless it is made with pasteurized milk, which must be stated on the label). Beverages Alcohol. Sugar-sweetened beverages, such as sodas, teas, or energy drinks. Seasoning and other foods Homemade fermented foods and drinks, such as pickles, sauerkraut, or kombucha drinks. (Store-bought pasteurized versions of these are okay.) Salads that are made in a store or deli, such as ham salad, chicken salad, egg salad, tuna salad, and seafood salad. The items listed above may not be a complete list of foods and beverages to avoid. Contact your dietitian for more information. Where to find more information To calculate the number of calories you need based on your height, weight, and activity level, you can use an online calculator such as:  www.choosemyplate.gov/MyPlatePlan To calculate how much weight you should gain during pregnancy, you can use an online pregnancy weight gain calculator such as:  www.choosemyplate.gov/pregnancy-weight-gain-calculator Summary  While you are pregnant,  your body requires additional nutrition to help support your growing baby.  Eat a variety of foods, especially fruits and vegetables to get a full range of vitamins and minerals.  Practice good food safety and cleanliness. Wash your hands before you eat and after you prepare raw meat. Wash all fruits and vegetables well before peeling or eating. Taking these actions can help to prevent food-borne illnesses, such as listeriosis, that can be very dangerous to your baby.  Do not eat raw meat or fish. Do not eat fish that have high mercury content, such as tilefish, shark, swordfish, and king mackerel. Do not eat unpasteurized (raw) dairy.  Take a prenatal vitamin to help meet your additional vitamin and mineral needs during pregnancy, specifically for folic acid, iron, calcium, and vitamin D. This information is not intended to replace advice given to you by your health care provider. Make sure you discuss any questions you have with your health care   provider. Document Released: 11/24/2013 Document Revised: 11/06/2016 Document Reviewed: 11/06/2016 Elsevier Interactive Patient Education  2019 [employer] Prenatal Care Prenatal care is health care during pregnancy. It helps you and your unborn baby (fetus) stay as healthy as possible. Prenatal care may be provided by a midwife, a family practice health care provider, or a childbirth and pregnancy specialist (obstetrician). How does this affect me? During pregnancy, you will be closely monitored for any new conditions that might develop. To lower your risk of pregnancy complications, you and your health care provider will talk about any underlying conditions you have. How does this affect my baby? Early and consistent prenatal care increases the chance that your baby will be healthy during pregnancy. Prenatal care lowers the risk that your baby will be:  Born early (prematurely).  Smaller than expected at birth (small for gestational age). What  can I expect at the first prenatal care visit? Your first prenatal care visit will likely be the longest. You should schedule your first prenatal care visit as soon as you know that you are pregnant. Your first visit is a good time to talk about any questions or concerns you have about pregnancy. At your visit, you and your health care provider will talk about:  Your medical history, including: ? Any past pregnancies. ? Your family's medical history. ? The baby's father's medical history. ? Any long-term (chronic) health conditions you have and how you manage them. ? Any surgeries or procedures you have had. ? Any current over-the-counter or prescription medicines, herbs, or supplements you are taking.  Other factors that could pose a risk to your baby, including:  Your home setting and your stress levels, including: ? Exposure to abuse or violence. ? Household financial strain. ? Mental health conditions you have.  Your daily health habits, including diet and exercise. Your health care provider will also:  Measure your weight, height, and blood pressure.  Do a physical exam, including a pelvic and breast exam.  Perform blood tests and urine tests to check for: ? Urinary tract infection. ? Sexually transmitted infections (STIs). ? Low iron levels in your blood (anemia). ? Blood type and certain proteins on red blood cells (Rh antibodies). ? Infections and immunity to viruses, such as hepatitis B and rubella. ? HIV (human immunodeficiency virus).  Do an ultrasound to confirm your baby's growth and development and to help predict your estimated due date (EDD). This ultrasound is done with a probe that is inserted into the vagina (transvaginal ultrasound).  Discuss your options for genetic screening.  Give you information about how to keep yourself and your baby healthy, including: ? Nutrition and taking vitamins. ? Physical activity. ? How to manage pregnancy symptoms such as  nausea and vomiting (morning sickness). ? Infections and substances that may be harmful to your baby and how to avoid them. ? Food safety. ? Dental care. ? Working. ? Travel. ? Warning signs to watch for and when to call your health care provider. How often will I have prenatal care visits? After your first prenatal care visit, you will have regular visits throughout your pregnancy. The visit schedule is often as follows:  Up to week 28 of pregnancy: once every 4 weeks.  28-36 weeks: once every 2 weeks.  After 36 weeks: every week until delivery. Some women may have visits more or less often depending on any underlying health conditions and the health of the baby. Keep all follow-up and prenatal care visits as told by   your health care provider. This is important. What happens during routine prenatal care visits? Your health care provider will:  Measure your weight and blood pressure.  Check for fetal heart sounds.  Measure the height of your uterus in your abdomen (fundal height). This may be measured starting around week 20 of pregnancy.  Check the position of your baby inside your uterus.  Ask questions about your diet, sleeping patterns, and whether you can feel the baby move.  Review warning signs to watch for and signs of labor.  Ask about any pregnancy symptoms you are having and how you are dealing with them. Symptoms may include: ? Headaches. ? Nausea and vomiting. ? Vaginal discharge. ? Swelling. ? Fatigue. ? Constipation. ? Any discomfort, including back or pelvic pain. Make a list of questions to ask your health care provider at your routine visits. What tests might I have during prenatal care visits? You may have blood, urine, and imaging tests throughout your pregnancy, such as:  Urine tests to check for glucose, protein, or signs of infection.  Glucose tests to check for a form of diabetes that can develop during pregnancy (gestational diabetes mellitus).  This is usually done around week 24 of pregnancy.  An ultrasound to check your baby's growth and development and to check for birth defects. This is usually done around week 20 of pregnancy.  A test to check for group B strep (GBS) infection. This is usually done around week 36 of pregnancy.  Genetic testing. This may include blood or imaging tests, such as an ultrasound. Some genetic tests are done during the first trimester and some are done during the second trimester. What else can I expect during prenatal care visits? Your health care provider may recommend getting certain vaccines during pregnancy. These may include:  A yearly flu shot (annual influenza vaccine). This is especially important if you will be pregnant during flu season.  Tdap (tetanus, diphtheria, pertussis) vaccine. Getting this vaccine during pregnancy can protect your baby from whooping cough (pertussis) after birth. This vaccine may be recommended between weeks 27 and 36 of pregnancy. Later in your pregnancy, your health care provider may give you information about:  Childbirth and breastfeeding classes.  Choosing a health care provider for your baby.  Umbilical cord banking.  Breastfeeding.  Birth control after your baby is born.  The hospital labor and delivery unit and how to tour it.  Registering at the hospital before you go into labor. Where to find more information  Office on Women's Health: womenshealth.gov  American Pregnancy Association: americanpregnancy.org  March of Dimes: marchofdimes.org Summary  Prenatal care helps you and your baby stay as healthy as possible during pregnancy.  Your first prenatal care visit will most likely be the longest.  You will have visits and tests throughout your pregnancy to monitor your health and your baby's health.  Bring a list of questions to your visits to ask your health care provider.  Make sure to keep all follow-up and prenatal care visits with  your health care provider. This information is not intended to replace advice given to you by your health care provider. Make sure you discuss any questions you have with your health care provider. Document Released: 02/12/2003 Document Revised: 02/08/2017 Document Reviewed: 02/08/2017 Elsevier Interactive Patient Education  2019 [employer]  

## 2018-04-27 NOTE — Patient Instructions (Signed)
First Trimester of Pregnancy  The first trimester of pregnancy is from week 1 until the end of week 13 (months 1 through 3). A week after a sperm fertilizes an egg, the egg will implant on the wall of the uterus. This embryo will begin to develop into a baby. Genes from you and your partner will form the baby. The female genes will determine whether the baby will be a boy or a girl. At 6-8 weeks, the eyes and face will be formed, and the heartbeat can be seen on ultrasound. At the end of 12 weeks, all the baby's organs will be formed.  Now that you are pregnant, you will want to do everything you can to have a healthy baby. Two of the most important things are to get good prenatal care and to follow your health care provider's instructions. Prenatal care is all the medical care you receive before the baby's birth. This care will help prevent, find, and treat any problems during the pregnancy and childbirth.  Body changes during your first trimester  Your body goes through many changes during pregnancy. The changes vary from woman to woman.   You may gain or lose a couple of pounds at first.   You may feel sick to your stomach (nauseous) and you may throw up (vomit). If the vomiting is uncontrollable, call your health care provider.   You may tire easily.   You may develop headaches that can be relieved by medicines. All medicines should be approved by your health care provider.   You may urinate more often. Painful urination may mean you have a bladder infection.   You may develop heartburn as a result of your pregnancy.   You may develop constipation because certain hormones are causing the muscles that push stool through your intestines to slow down.   You may develop hemorrhoids or swollen veins (varicose veins).   Your breasts may begin to grow larger and become tender. Your nipples may stick out more, and the tissue that surrounds them (areola) may become darker.   Your gums may bleed and may be  sensitive to brushing and flossing.   Dark spots or blotches (chloasma, mask of pregnancy) may develop on your face. This will likely fade after the baby is born.   Your menstrual periods will stop.   You may have a loss of appetite.   You may develop cravings for certain kinds of food.   You may have changes in your emotions from day to day, such as being excited to be pregnant or being concerned that something may go wrong with the pregnancy and baby.   You may have more vivid and strange dreams.   You may have changes in your hair. These can include thickening of your hair, rapid growth, and changes in texture. Some women also have hair loss during or after pregnancy, or hair that feels dry or thin. Your hair will most likely return to normal after your baby is born.  What to expect at prenatal visits  During a routine prenatal visit:   You will be weighed to make sure you and the baby are growing normally.   Your blood pressure will be taken.   Your abdomen will be measured to track your baby's growth.   The fetal heartbeat will be listened to between weeks 10 and 14 of your pregnancy.   Test results from any previous visits will be discussed.  Your health care provider may ask you:     How you are feeling.   If you are feeling the baby move.   If you have had any abnormal symptoms, such as leaking fluid, bleeding, severe headaches, or abdominal cramping.   If you are using any tobacco products, including cigarettes, chewing tobacco, and electronic cigarettes.   If you have any questions.  Other tests that may be performed during your first trimester include:   Blood tests to find your blood type and to check for the presence of any previous infections. The tests will also be used to check for low iron levels (anemia) and protein on red blood cells (Rh antibodies). Depending on your risk factors, or if you previously had diabetes during pregnancy, you may have tests to check for high blood sugar  that affects pregnant women (gestational diabetes).   Urine tests to check for infections, diabetes, or protein in the urine.   An ultrasound to confirm the proper growth and development of the baby.   Fetal screens for spinal cord problems (spina bifida) and Down syndrome.   HIV (human immunodeficiency virus) testing. Routine prenatal testing includes screening for HIV, unless you choose not to have this test.   You may need other tests to make sure you and the baby are doing well.  Follow these instructions at home:  Medicines   Follow your health care provider's instructions regarding medicine use. Specific medicines may be either safe or unsafe to take during pregnancy.   Take a prenatal vitamin that contains at least 600 micrograms (mcg) of folic acid.   If you develop constipation, try taking a stool softener if your health care provider approves.  Eating and drinking     Eat a balanced diet that includes fresh fruits and vegetables, whole grains, good sources of protein such as meat, eggs, or tofu, and low-fat dairy. Your health care provider will help you determine the amount of weight gain that is right for you.   Avoid raw meat and uncooked cheese. These carry germs that can cause birth defects in the baby.   Eating four or five small meals rather than three large meals a day may help relieve nausea and vomiting. If you start to feel nauseous, eating a few soda crackers can be helpful. Drinking liquids between meals, instead of during meals, also seems to help ease nausea and vomiting.   Limit foods that are high in fat and processed sugars, such as fried and sweet foods.   To prevent constipation:  ? Eat foods that are high in fiber, such as fresh fruits and vegetables, whole grains, and beans.  ? Drink enough fluid to keep your urine clear or pale yellow.  Activity   Exercise only as directed by your health care provider. Most women can continue their usual exercise routine during  pregnancy. Try to exercise for 30 minutes at least 5 days a week. Exercising will help you:  ? Control your weight.  ? Stay in shape.  ? Be prepared for labor and delivery.   Experiencing pain or cramping in the lower abdomen or lower back is a good sign that you should stop exercising. Check with your health care provider before continuing with normal exercises.   Try to avoid standing for long periods of time. Move your legs often if you must stand in one place for a long time.   Avoid heavy lifting.   Wear low-heeled shoes and practice good posture.   You may continue to have sex unless your health care   provider tells you not to.  Relieving pain and discomfort   Wear a good support bra to relieve breast tenderness.   Take warm sitz baths to soothe any pain or discomfort caused by hemorrhoids. Use hemorrhoid cream if your health care provider approves.   Rest with your legs elevated if you have leg cramps or low back pain.   If you develop varicose veins in your legs, wear support hose. Elevate your feet for 15 minutes, 3-4 times a day. Limit salt in your diet.  Prenatal care   Schedule your prenatal visits by the twelfth week of pregnancy. They are usually scheduled monthly at first, then more often in the last 2 months before delivery.   Write down your questions. Take them to your prenatal visits.   Keep all your prenatal visits as told by your health care provider. This is important.  Safety   Wear your seat belt at all times when driving.   Make a list of emergency phone numbers, including numbers for family, friends, the hospital, and police and fire departments.  General instructions   Ask your health care provider for a referral to a local prenatal education class. Begin classes no later than the beginning of month 6 of your pregnancy.   Ask for help if you have counseling or nutritional needs during pregnancy. Your health care provider can offer advice or refer you to specialists for help  with various needs.   Do not use hot tubs, steam rooms, or saunas.   Do not douche or use tampons or scented sanitary pads.   Do not cross your legs for long periods of time.   Avoid cat litter boxes and soil used by cats. These carry germs that can cause birth defects in the baby and possibly loss of the fetus by miscarriage or stillbirth.   Avoid all smoking, herbs, alcohol, and medicines not prescribed by your health care provider. Chemicals in these products affect the formation and growth of the baby.   Do not use any products that contain nicotine or tobacco, such as cigarettes and e-cigarettes. If you need help quitting, ask your health care provider. You may receive counseling support and other resources to help you quit.   Schedule a dentist appointment. At home, brush your teeth with a soft toothbrush and be gentle when you floss.  Contact a health care provider if:   You have dizziness.   You have mild pelvic cramps, pelvic pressure, or nagging pain in the abdominal area.   You have persistent nausea, vomiting, or diarrhea.   You have a bad smelling vaginal discharge.   You have pain when you urinate.   You notice increased swelling in your face, hands, legs, or ankles.   You are exposed to fifth disease or chickenpox.   You are exposed to German measles (rubella) and have never had it.  Get help right away if:   You have a fever.   You are leaking fluid from your vagina.   You have spotting or bleeding from your vagina.   You have severe abdominal cramping or pain.   You have rapid weight gain or loss.   You vomit blood or material that looks like coffee grounds.   You develop a severe headache.   You have shortness of breath.   You have any kind of trauma, such as from a fall or a car accident.  Summary   The first trimester of pregnancy is from week 1 until   the end of week 13 (months 1 through 3).   Your body goes through many changes during pregnancy. The changes vary from  woman to woman.   You will have routine prenatal visits. During those visits, your health care provider will examine you, discuss any test results you may have, and talk with you about how you are feeling.  This information is not intended to replace advice given to you by your health care provider. Make sure you discuss any questions you have with your health care provider.  Document Released: 02/03/2001 Document Revised: 01/22/2016 Document Reviewed: 01/22/2016  Elsevier Interactive Patient Education  2019 [employer]

## 2018-04-27 NOTE — Progress Notes (Signed)
  Routine Prenatal Care Visit  Subjective  Annette Roberts is a 33 y.o. N1Z0017 at [redacted]w[redacted]d being seen today for ongoing prenatal care.  She is currently monitored for the following issues for this high-risk pregnancy and has Hilar lymphadenopathy; Tobacco dependence; Supervision of high risk pregnancy, antepartum; and History of substance use on their problem list.  ----------------------------------------------------------------------------------- Patient reports nausea.    . Vag. Bleeding: None.   . Denies leaking of fluid.  ----------------------------------------------------------------------------------- The following portions of the patient's history were reviewed and updated as appropriate: allergies, current medications, past family history, past medical history, past social history, past surgical history and problem list. Problem list updated.   Objective  Blood pressure 106/68, weight 115 lb (52.2 kg), last menstrual period 03/01/2018 Pregravid weight 105 lb (47.6 kg) Total Weight Gain 10 lb (4.536 kg) Urinalysis: Urine Protein    Urine Glucose    Fetal Status: Fetal Heart Rate (bpm): 111         Dating: [redacted]w[redacted]d, EDD adjusted  General:  Alert, oriented and cooperative. Patient is in no acute distress.  Skin: Skin is warm and dry. No rash noted.   Cardiovascular: Normal heart rate noted  Respiratory: Normal respiratory effort, no problems with respiration noted  Abdomen: Soft, gravid, appropriate for gestational age.       Pelvic:  Cervical exam deferred        Extremities: Normal range of motion.     Mental Status: Normal mood and affect. Normal behavior. Normal judgment and thought content.   Assessment   33 y.o. [id number] at [redacted]w[redacted]d by  12/14/2018, by Ultrasound presenting for routine prenatal visit  Plan   Pregnancy #3 Problems (from 03/01/18 to present)    Problem Noted Resolved   Supervision of high risk pregnancy, antepartum 04/26/2018 by Oswaldo Conroy, CNM No   Overview  Signed 04/26/2018  3:08 PM by Oswaldo Conroy, CNM    [facility] Prenatal Labs  Dating  Blood type:     Genetic Screen 1 Screen:    AFP:     Quad:     NIPS: Antibody:   Anatomic [place]  Rubella:   Varicella:    GTT Early:               Third trimester:  RPR:     Rhogam  HBsAg:     TDaP vaccine  Flu Shot: 04/27/18 HIV:     Baby Food                                GBS:   Contraception  Pap:  CBB     CS/VBAC    Support Person                  Preterm labor symptoms and general obstetric precautions including but not limited to vaginal bleeding, contractions, leaking of fluid and fetal movement were reviewed in detail with the patient. Please refer to After Visit Summary for other counseling recommendations.   Return in about 4 weeks (around 05/25/2018) for rob.  Tresea Mall, CNM 04/27/2018 2:10 PM

## 2018-04-28 LAB — URINE DRUG PANEL 7
AMPHETAMINES, URINE: NEGATIVE ng/mL
Barbiturate Quant, Ur: NEGATIVE ng/mL
Benzodiazepine Quant, Ur: NEGATIVE ng/mL
COCAINE (METAB.): NEGATIVE ng/mL
Cannabinoid Quant, Ur: POSITIVE — AB
OPIATE QUANT UR: NEGATIVE ng/mL
PCP Quant, Ur: NEGATIVE ng/mL

## 2018-04-28 LAB — URINE CULTURE

## 2018-04-29 LAB — CYTOLOGY - PAP
Chlamydia: NEGATIVE
Diagnosis: NEGATIVE
HPV (WINDOPATH): NOT DETECTED
NEISSERIA GONORRHEA: NEGATIVE

## 2018-05-25 ENCOUNTER — Encounter: Payer: Self-pay | Admitting: Maternal Newborn

## 2018-05-25 ENCOUNTER — Other Ambulatory Visit: Payer: Self-pay

## 2018-05-25 ENCOUNTER — Ambulatory Visit (INDEPENDENT_AMBULATORY_CARE_PROVIDER_SITE_OTHER): Payer: Medicaid Other | Admitting: Maternal Newborn

## 2018-05-25 VITALS — Wt 114.0 lb

## 2018-05-25 DIAGNOSIS — Z1379 Encounter for other screening for genetic and chromosomal anomalies: Secondary | ICD-10-CM

## 2018-05-25 DIAGNOSIS — Z3A11 11 weeks gestation of pregnancy: Secondary | ICD-10-CM

## 2018-05-25 DIAGNOSIS — O09219 Supervision of pregnancy with history of pre-term labor, unspecified trimester: Secondary | ICD-10-CM

## 2018-05-25 DIAGNOSIS — O09211 Supervision of pregnancy with history of pre-term labor, first trimester: Secondary | ICD-10-CM

## 2018-05-25 DIAGNOSIS — O09899 Supervision of other high risk pregnancies, unspecified trimester: Secondary | ICD-10-CM

## 2018-05-25 DIAGNOSIS — O0990 Supervision of high risk pregnancy, unspecified, unspecified trimester: Secondary | ICD-10-CM

## 2018-05-25 DIAGNOSIS — O34219 Maternal care for unspecified type scar from previous cesarean delivery: Secondary | ICD-10-CM

## 2018-05-25 LAB — POCT URINALYSIS DIPSTICK OB
Glucose, UA: NEGATIVE
POC,PROTEIN,UA: NEGATIVE

## 2018-05-25 MED ORDER — HYDROXYPROGESTERONE CAPROATE 250 MG/ML IM OIL: 250.0000 mg | TOPICAL_OIL | INTRAMUSCULAR | 0 refills | Status: DC

## 2018-05-25 NOTE — Progress Notes (Signed)
ROB- no concerns 

## 2018-05-25 NOTE — Patient Instructions (Signed)
   Given the current COVID-19 pandemic, our practice is making changes in how we are providing care to our patients. We are limiting in-person visits for the safety of all of our patients.   As a practice, we have met to discuss the best way to minimize visits, but still provide excellent care to our expecting mothers.  We have decided on the following visit structure for low-risk pregnancies.  Initial Pregnancy visit will be conducted as a telephone or web visit.  Between 10-14 weeks  there will be one in-person visit for an ultrasound, lab work, and genetic screening. 20 weeks in-person visit with an anatomy ultrasound  28 weeks in-person office visit for a 1-hour glucose test and a TDAP vaccination 32 weeks in-person office visit 34 weeks telephone visit 36 weeks in-person office visit for GBS, chlamydia, and gonorrhea testing 38 weeks in-person office visit 40 weeks in-person office visit  Understandably, some patients will require more visits than what is outlined above. Additional visits will be determined on a case-by-case basis.   We will, as always, be available for emergencies or to address concerns that might arise between in-person visits. We ask that you allow us the opportunity to address any concerns over the phone or through a virtual visit first. We will be available to return your phone calls throughout the day.   If you are able to purchase a scale, a blood pressure machine, and a home fetal doppler visits could be limited further. This will help decrease your exposure risks, but these purchases are not a necessity.   Things seem to change daily and there is the possibility that this structure could change, please be patient as we adapt to a new way of caring for patients.   Thank you for trusting us with your prenatal care. Our practice values you and looks forward to providing you with excellent care.   Sincerely,   [facility], [facility]     COVID-19 and Your Pregnancy FAQ  How can I prevent infection with COVID-19 during my pregnancy? Social distancing is key. Please limit any interactions in public. Try and work from home if possible. Frequently wash your hands after touching possibly contaminated surfaces. Avoid touching your face.  Minimize trips to the store. Consider online ordering when possible.   Should I wear a mask? Masks should only be worn by those experiencing symptoms of COVID-19 or those with confirmed COVID-19 when they are in public or around other individuals.  What are the symptoms of COVID-19? Fever (greater than 100.4 F), dry cough, shortness of breath.  Am I more at risk for COVID-19 since I am pregnant? There is not currently data showing that pregnant women are more adversely impacted by COVID-19 than the general population. However, we know that pregnant women tend to have worse respiratory complications from similar diseases such as the flu and SARS and for this reason should be considered an at-risk population.  What do I do if I am experiencing the symptoms of COVID-19? Testing is being limited because of test availability. If you are experiencing symptoms you should quarantine yourself, and the members of your family, for at least 2 weeks at home.   Please visit this website for more information: https://www.cdc.gov/coronavirus/2019-ncov/if-you-are-sick/steps-when-sick.html  When should I go to the Emergency Room? Please go to the emergency room if you are experiencing ANY of these symptoms*:  1.    Difficulty breathing or shortness of breath 2.    Persistent   pain or pressure in the chest 3.    Confusion or difficulty being aroused (or awakened) 4.    Bluish lips or face  *This list is not all inclusive. Please consult our office for any other symptoms that are severe or concerning.  What do I do if I am having difficulty breathing? You should go to the Emergency Room for evaluation. At  this time they have a tent set up for evaluating patients with COVID-19 symptoms.   How will my prenatal care be different because of the COVID-19 pandemic? It has been recommended to reduce the frequency of face-to-face visits and use resources such as telephone and virtual visits when possible. Using a scale, blood pressure machine and fetal doppler at home can further help reduce face-to-face visits. You will be provided with additional information on this topic.  We ask that you come to your visits alone to minimize potential exposures to  COVID-19.  How can I receive childbirth education? At this time in-person classes have been cancelled. You can register for online childbirth education, breastfeeding, and newborn care classes.  Please visit:  [email or web address] for more information  How will my hospital birth experience be different? The hospital is currently limiting visitors. This means that while you are in labor you can only have one person at the hospital with you. Additional family members will not be allowed to wait in the building or outside your room. Your one support person can be the father of the baby, a relative, a doula, or a friend. Once one support person is designated that person will wear a band. This band cannot be shared with multiple people.  How long will I stay in the hospital for after giving birth? It is also recommended that discharge home be expedited during the COVID-19 outbreak. This means staying for 1 day after a vaginal delivery and 2 days after a cesarean section.  What if I have COVID-19 and I am in labor? We ask that you wear a mask while on labor and delivery. We will try and accommodate you being placed in a room that is capable of filtering the air. Please call ahead if you are in labor and on your way to the hospital. The phone number for labor and delivery at [facility] is [phone].  If I have COVID-19 when my baby  is born how can I prevent my baby from contracting COVID-19? This is an issue that will have to be discussed on a case-by-case basis. Current recommendations suggest providing separate isolation rooms for both the mother and new infant as well as limiting visitors. However, there are practical challenges to this recommendation. The situation will assuredly change and decisions will be influenced by the desires of the mother and availability of space.  Some suggestions are the use of a curtain or physical barrier between mom and infant, hand hygiene, mom wearing a mask, or 6 feet of spacing between a mom and infant.   Can I breastfeed during the COVID-19 pandemic?   Yes, breastfeeding is encouraged.  Can I breastfeed if I have COVID-19? Yes. Covid-19 has not been found in breast milk. This means you cannot give COVID-19 to your child through breast milk. Breast feeding will also help pass antibodies to fight infection to your baby.   What precautions should I take when breastfeeding if I have COVID-19? If a mother and newborn do room-in and the mother wishes to feed at the breast, she should put   on a facemask and practice hand hygiene before each feeding.  What precautions should I take when pumping if I have COVID-19? Prior to expressing breast milk, mothers should practice hand hygiene. After each pumping session, all parts that come into contact with breast milk should be thoroughly washed and the entire pump should be appropriately disinfected per the manufacturer's instructions. This expressed breast milk should be fed to the newborn by a healthy caregiver.  What if I am pregnant and work in healthcare? Based on limited data regarding COVID-19 and pregnancy, ACOG currently does not propose creating additional restrictions on pregnant health care personnel because of COVID-19 alone. Pregnant women do not appear to be at higher risk of severe disease related to COVID-19. Pregnant health care  personnel should follow CDC risk assessment and infection control guidelines for health care personnel exposed to patients with suspected or confirmed COVID-19. Adherence to recommended infection prevention and control practices is an important part of protecting all health care personnel in health care settings.    Information on COVID-19 in pregnancy is very limited; however, facilities may want to consider limiting exposure of pregnant health care personnel to patients with confirmed or suspected COVID-19 infection, especially during higher-risk procedures (eg, aerosol-generating procedures), if feasible, based on staffing availability.   

## 2018-05-25 NOTE — Progress Notes (Signed)
    Routine Prenatal Care Visit  Subjective  Laureli Grondahl Venugopal is a 33 y.o. [id number] at [redacted]w[redacted]d being seen today for ongoing prenatal care.  She is currently monitored for the following issues for this high-risk pregnancy and has Hilar lymphadenopathy; Tobacco dependence; Supervision of high risk pregnancy, antepartum; History of substance use; History of preterm delivery, currently pregnant; and History of cesarean delivery, currently pregnant on their problem list.  ----------------------------------------------------------------------------------- Patient reports ongoing nausea, usually able to prevent vomiting with Diclegis.   Vag. Bleeding: None.  ----------------------------------------------------------------------------------- The following portions of the patient's history were reviewed and updated as appropriate: allergies, current medications, past family history, past medical history, past social history, past surgical history and problem list. Problem list updated.  Objective  Weight 114 lb (51.7 kg), last menstrual period 03/01/2018, unknown if currently breastfeeding.  Pregravid weight 105 lb (47.6 kg) Total Weight Gain 9 lb (4.082 kg) Body mass index is 21.54 kg/m.   Urinalysis: Urine dipstick shows negative for glucose, protein.  Fetal Status: Fetal Heart Rate (bpm): 172         General:  Alert, oriented and cooperative. Patient is in no acute distress.  Skin: Skin is warm and dry. No rash noted.   Cardiovascular: Normal heart rate noted  Respiratory: Normal respiratory effort, no problems with respiration noted  Abdomen: Soft, gravid, appropriate for gestational age. Pain/Pressure: Absent     Pelvic:  Cervical exam deferred        Extremities: Normal range of motion.  Edema: None  Mental Status: Normal mood and affect. Normal behavior. Normal judgment and thought content.    Assessment   32 y.o. [id number] at [redacted]w[redacted]d, EDD 12/14/2018 by Ultrasound presenting for a routine  prenatal visit.  Plan   Pregnancy #3 Problems (from 03/01/18 to present)    Problem Noted Resolved   Supervision of high risk pregnancy, antepartum 04/26/2018 by Oswaldo Conroy, CNM No   Overview Signed 04/26/2018  3:08 PM by Oswaldo Conroy, CNM    [facility] Prenatal Labs  Dating  Blood type:     Genetic Screen 1 Screen:    AFP:     Quad:     NIPS: Antibody:   Anatomic [place]  Rubella:   Varicella:    GTT Early:               Third trimester:  RPR:     Rhogam  HBsAg:     TDaP vaccine                       Flu Shot: HIV:     Baby Food                                GBS:   Contraception  Pap:  CBB     CS/VBAC    Support Person               Reviewed lab results. She not using marijuana any longer, encouraged continued cessation. Consented to starting Makena injections at 16 weeks.  Discussed spacing of visits due to COVID-19 precautions, encouraged to stay in touch via MyChart/telephone for any concerns or questions. Written information provided with After Visit Summary.  Return in about 5 weeks (around 06/29/2018) for ROB and Makena injection.  Marcelyn Bruins, CNM 05/25/2018

## 2018-05-29 LAB — MATERNIT 21 PLUS CORE, BLOOD
Fetal Fraction: 5
Result (T21): NEGATIVE
Trisomy 13 (Patau syndrome): NEGATIVE
Trisomy 18 (Edwards syndrome): NEGATIVE
Trisomy 21 (Down syndrome): NEGATIVE

## 2018-06-29 ENCOUNTER — Other Ambulatory Visit: Payer: Self-pay

## 2018-06-29 ENCOUNTER — Ambulatory Visit (INDEPENDENT_AMBULATORY_CARE_PROVIDER_SITE_OTHER): Payer: Medicaid Other | Admitting: Maternal Newborn

## 2018-06-29 VITALS — BP 104/60 | Wt 119.0 lb

## 2018-06-29 DIAGNOSIS — O219 Vomiting of pregnancy, unspecified: Secondary | ICD-10-CM

## 2018-06-29 DIAGNOSIS — Z8751 Personal history of pre-term labor: Secondary | ICD-10-CM

## 2018-06-29 DIAGNOSIS — Z3A16 16 weeks gestation of pregnancy: Secondary | ICD-10-CM | POA: Diagnosis not present

## 2018-06-29 DIAGNOSIS — O09212 Supervision of pregnancy with history of pre-term labor, second trimester: Secondary | ICD-10-CM | POA: Diagnosis not present

## 2018-06-29 DIAGNOSIS — Z3689 Encounter for other specified antenatal screening: Secondary | ICD-10-CM

## 2018-06-29 DIAGNOSIS — O0990 Supervision of high risk pregnancy, unspecified, unspecified trimester: Secondary | ICD-10-CM

## 2018-06-29 DIAGNOSIS — F17200 Nicotine dependence, unspecified, uncomplicated: Secondary | ICD-10-CM

## 2018-06-29 LAB — POCT URINALYSIS DIPSTICK OB
Glucose, UA: NEGATIVE
POC,PROTEIN,UA: NEGATIVE

## 2018-06-29 MED ORDER — PROMETHAZINE HCL 25 MG PO TABS: 25.0000 mg | ORAL_TABLET | Freq: Four times a day (QID) | ORAL | 2 refills | Status: DC | PRN

## 2018-06-29 MED ORDER — HYDROXYPROGESTERONE CAPROATE 250 MG/ML IM OIL
250.0000 mg | TOPICAL_OIL | Freq: Once | INTRAMUSCULAR | Status: AC
  Administered 2018-06-29: 15:00:00 250 mg via INTRAMUSCULAR

## 2018-06-29 NOTE — Progress Notes (Signed)
Routine Prenatal Care Visit  Subjective  Annette Roberts is a 33 y.o. [id number] at [redacted]w[redacted]d being seen today for ongoing prenatal care.  She is currently monitored for the following issues for this high-risk pregnancy and has Hilar lymphadenopathy; Tobacco dependence; Supervision of high risk pregnancy, antepartum; History of substance use; History of preterm delivery, currently pregnant; and History of cesarean delivery, currently pregnant on their problem list.  ----------------------------------------------------------------------------------- Patient reports nausea and vomiting.  She is taking Diclegis which helps her symptoms improve somewhat, but she is still vomiting daily. Is able to keep food/drink down. Vag. Bleeding: None.  No leaking of fluid.  ----------------------------------------------------------------------------------- The following portions of the patient's history were reviewed and updated as appropriate: allergies, current medications, past family history, past medical history, past social history, past surgical history and problem list. Problem list updated.   Objective  Blood pressure 104/60, weight 119 lb (54 kg), last menstrual period 03/01/2018, unknown if currently breastfeeding. Pregravid weight 105 lb (47.6 kg) Total Weight Gain 14 lb (6.35 kg) Urinalysis: Urine dipstick shows negative for glucose, protein.  Fetal Status: Fetal Heart Rate (bpm): 158         General:  Alert, oriented and cooperative. Patient is in no acute distress.  Skin: Skin is warm and dry. No rash noted.   Cardiovascular: Normal heart rate noted  Respiratory: Normal respiratory effort, no problems with respiration noted  Abdomen: Soft, gravid, appropriate for gestational age. Pain/Pressure: Absent     Pelvic:  Cervical exam deferred        Extremities: Normal range of motion.     Mental Status: Normal mood and affect. Normal behavior. Normal judgment and thought content.     Assessment    32 y.o. D1V6160 at [redacted]w[redacted]d, EDD 12/14/2018 by Ultrasound presenting for a routine prenatal visit.  Plan   Pregnancy #3 Problems (from 03/01/18 to present)    Problem Noted Resolved   Supervision of high risk pregnancy, antepartum 04/26/2018 by Oswaldo Conroy, CNM No   Overview Signed 04/26/2018  3:08 PM by Oswaldo Conroy, CNM    [facility] Prenatal Labs  Dating  Blood type:     Genetic Screen 1 Screen:    AFP:     Quad:     NIPS: Antibody:   Anatomic [place]  Rubella:   Varicella:    GTT Early:               Third trimester:  RPR:     Rhogam  HBsAg:     TDaP vaccine                       Flu Shot: HIV:     Baby Food                                GBS:   Contraception  Pap:  CBB     CS/VBAC    Support Person               She is concerned that she is not gaining enough weight. Discussed adding another agent to help with control of nausea. Rx sent for Phenergan. She will let us know if it is not effective so we can try a different medication.  Please refer to After Visit Summary for other counseling recommendations.   Return in about 1 week (around 07/06/2018) for ROB/anatomy scan.  Marcelyn Bruins, CNM  06/29/2018   

## 2018-06-29 NOTE — Progress Notes (Signed)
ROB/Makena injection- no concerns

## 2018-06-30 ENCOUNTER — Encounter: Payer: Self-pay | Admitting: Maternal Newborn

## 2018-06-30 DIAGNOSIS — O219 Vomiting of pregnancy, unspecified: Secondary | ICD-10-CM | POA: Insufficient documentation

## 2018-06-30 NOTE — Patient Instructions (Signed)

## 2018-07-06 ENCOUNTER — Ambulatory Visit (INDEPENDENT_AMBULATORY_CARE_PROVIDER_SITE_OTHER): Payer: Medicaid Other

## 2018-07-06 ENCOUNTER — Other Ambulatory Visit: Payer: Self-pay

## 2018-07-06 DIAGNOSIS — O09899 Supervision of other high risk pregnancies, unspecified trimester: Secondary | ICD-10-CM

## 2018-07-06 DIAGNOSIS — O09212 Supervision of pregnancy with history of pre-term labor, second trimester: Secondary | ICD-10-CM

## 2018-07-06 MED ORDER — HYDROXYPROGESTERONE CAPROATE 250 MG/ML IM OIL
250.0000 mg | TOPICAL_OIL | Freq: Once | INTRAMUSCULAR | Status: AC
  Administered 2018-07-06: 250 mg via INTRAMUSCULAR

## 2018-07-13 ENCOUNTER — Other Ambulatory Visit: Payer: Self-pay

## 2018-07-13 ENCOUNTER — Ambulatory Visit (INDEPENDENT_AMBULATORY_CARE_PROVIDER_SITE_OTHER): Payer: Medicaid Other

## 2018-07-13 DIAGNOSIS — Z8751 Personal history of pre-term labor: Secondary | ICD-10-CM

## 2018-07-13 DIAGNOSIS — O09212 Supervision of pregnancy with history of pre-term labor, second trimester: Secondary | ICD-10-CM | POA: Diagnosis not present

## 2018-07-13 MED ORDER — HYDROXYPROGESTERONE CAPROATE 250 MG/ML IM OIL
250.0000 mg | TOPICAL_OIL | Freq: Once | INTRAMUSCULAR | Status: AC
  Administered 2018-07-13: 250 mg via INTRAMUSCULAR

## 2018-07-20 ENCOUNTER — Ambulatory Visit (INDEPENDENT_AMBULATORY_CARE_PROVIDER_SITE_OTHER): Payer: Medicaid Other

## 2018-07-20 ENCOUNTER — Other Ambulatory Visit: Payer: Self-pay

## 2018-07-20 DIAGNOSIS — Z3A19 19 weeks gestation of pregnancy: Secondary | ICD-10-CM | POA: Diagnosis not present

## 2018-07-20 DIAGNOSIS — O09899 Supervision of other high risk pregnancies, unspecified trimester: Secondary | ICD-10-CM

## 2018-07-20 DIAGNOSIS — O09212 Supervision of pregnancy with history of pre-term labor, second trimester: Secondary | ICD-10-CM | POA: Diagnosis not present

## 2018-07-20 MED ORDER — HYDROXYPROGESTERONE CAPROATE 250 MG/ML IM OIL
250.0000 mg | TOPICAL_OIL | Freq: Once | INTRAMUSCULAR | Status: AC
  Administered 2018-07-20: 250 mg via INTRAMUSCULAR

## 2018-07-20 NOTE — Progress Notes (Signed)
Patient presents today for Hydroxyprogesterone injection within dates. Given IM RUOQ. Patient tolerated well.  

## 2018-07-27 ENCOUNTER — Other Ambulatory Visit: Payer: Self-pay

## 2018-07-27 ENCOUNTER — Ambulatory Visit (INDEPENDENT_AMBULATORY_CARE_PROVIDER_SITE_OTHER): Payer: Medicaid Other

## 2018-07-27 DIAGNOSIS — O09899 Supervision of other high risk pregnancies, unspecified trimester: Secondary | ICD-10-CM

## 2018-07-27 DIAGNOSIS — Z3A20 20 weeks gestation of pregnancy: Secondary | ICD-10-CM

## 2018-07-27 DIAGNOSIS — O09212 Supervision of pregnancy with history of pre-term labor, second trimester: Secondary | ICD-10-CM | POA: Diagnosis not present

## 2018-07-27 MED ORDER — HYDROXYPROGESTERONE CAPROATE 250 MG/ML IM OIL
250.0000 mg | TOPICAL_OIL | Freq: Once | INTRAMUSCULAR | Status: AC
  Administered 2018-07-27: 250 mg via INTRAMUSCULAR

## 2018-07-27 NOTE — Progress Notes (Signed)
Patient presents today for Hydroxyprogesterone injection within dates. Given IM LUOQ. Patient tolerated well. °

## 2018-08-03 ENCOUNTER — Ambulatory Visit (INDEPENDENT_AMBULATORY_CARE_PROVIDER_SITE_OTHER): Payer: Medicaid Other | Admitting: Obstetrics & Gynecology

## 2018-08-03 ENCOUNTER — Other Ambulatory Visit: Payer: Self-pay

## 2018-08-03 ENCOUNTER — Ambulatory Visit (INDEPENDENT_AMBULATORY_CARE_PROVIDER_SITE_OTHER): Payer: Medicaid Other

## 2018-08-03 ENCOUNTER — Encounter: Payer: Self-pay | Admitting: Obstetrics & Gynecology

## 2018-08-03 VITALS — BP 110/60 | Wt 123.0 lb

## 2018-08-03 DIAGNOSIS — O09212 Supervision of pregnancy with history of pre-term labor, second trimester: Secondary | ICD-10-CM | POA: Diagnosis not present

## 2018-08-03 DIAGNOSIS — Z363 Encounter for antenatal screening for malformations: Secondary | ICD-10-CM

## 2018-08-03 DIAGNOSIS — O34219 Maternal care for unspecified type scar from previous cesarean delivery: Secondary | ICD-10-CM

## 2018-08-03 DIAGNOSIS — Z3A21 21 weeks gestation of pregnancy: Secondary | ICD-10-CM

## 2018-08-03 DIAGNOSIS — O09899 Supervision of other high risk pregnancies, unspecified trimester: Secondary | ICD-10-CM

## 2018-08-03 DIAGNOSIS — Z3689 Encounter for other specified antenatal screening: Secondary | ICD-10-CM

## 2018-08-03 DIAGNOSIS — O0990 Supervision of high risk pregnancy, unspecified, unspecified trimester: Secondary | ICD-10-CM

## 2018-08-03 MED ORDER — HYDROXYPROGESTERONE CAPROATE 250 MG/ML IM OIL
250.0000 mg | TOPICAL_OIL | Freq: Once | INTRAMUSCULAR | Status: AC
  Administered 2018-08-03: 250 mg via INTRAMUSCULAR

## 2018-08-03 NOTE — Patient Instructions (Signed)
Prenatal Ultrasound A prenatal ultrasound exam, also called a sonogram, is an imaging test that allows your health care provider to see your baby and placenta in the uterus. This is a safe and painless test that does not expose you or your baby to any X-rays, needles, or medicines. Prenatal ultrasounds are done using a handheld plastic device (transducer) that sends out sound waves (ultrasound). The sound waves reflect off your baby's bones and other tissues to create moving images on a computer screen. There are two types of prenatal ultrasound:  Transabdominal ultrasound. During this test, a transducer is placed on your belly and moved around. A routine transabdominal ultrasound is usually done between weeks 18 and 22 of pregnancy (standard ultrasound). It may also be done between weeks 13 and 14.  Transvaginal ultrasound. During this test, a transducer that is shaped like a wand is placed inside your vagina. This type of ultrasound is usually done during early pregnancy. Prenatal ultrasounds may be used to check:  How far along your pregnancy is (stage).  Your baby's development (gestational age).  The location and condition of the organ that supplies your baby with nourishment and oxygen (placenta).  Your baby's heart rate, position, and movements.  Your baby's approximate size and weight.  The amount of fluid surrounding your baby (amniotic fluid).  If you are carrying more than one baby.  Your baby's sex (if your baby is in a position that allows the sex organs to be seen, and if you choose to learn the sex at this time).  If there are any possible problems that require more testing, such as genetic problems.  If your pregnancy is forming outside your uterus (ectopic pregnancy). You may have other ultrasounds as needed at any point during your pregnancy. If your health care provider suspects a problem, you may also have a more detailed type of transabdominal ultrasound (advanced  ultrasound). What are the risks? Generally, this is a safe test. There are no known risks for you or your baby from a prenatal ultrasound. What happens before the test?  Before a transabdominal ultrasound, you may be asked to drink fluid 2 hours before the exam and avoid emptying your bladder. A full bladder helps the images show up more clearly.  Before a transvaginal ultrasound, you may be asked to empty your bladder before the exam.  Wear loose, comfortable clothing so it is easy to undress or expose your lower belly for the exam. What happens during the test? If you are having a transabdominal ultrasound:  You will lie on an exam table.  Your belly will be exposed.  Gel will be rubbed over your belly.  The transducer will be pressed on your belly and moved back and forth, through the gel. You may feel slight pressure, but there should not be any pain.  You may be asked to change your position.  You may hear sounds of blood flow and your baby's heartbeat. You may be able to see images of your baby on the computer screen. Your health care provider may measure your baby's head and other body parts, looking for normal development.  After the exam, the gel will be cleaned off, and you can replace your clothing. You will be able to empty your bladder after the exam is done. If you are having a transvaginal ultrasound:  You will change into a hospital gown or undress from the waist down and cover yourself with a paper sheet.  You will lie down on   an exam table with your feet in footrests (stirrups).  The transducer will be covered with a protective cover and lubricated.  The transducer will be inserted into your vagina.  You may hear sounds of blood flow and your baby's heartbeat. You may be able to see images of your baby on the computer screen.  After the exam, the transducer will be removed, and you can put your clothes back on. What can I expect after the test?  You can  drive yourself home and return to all your normal activities.  A health care provider trained in interpreting ultrasounds will review the images taken during your exam and send a report to your health care provider.  It is up to you to get your test results. Ask your health care provider, or the department that is doing the test, when your results will be ready. Questions to ask your health care provider  Why am I having this prenatal ultrasound?  What information will this exam provide?  How much does this exam cost? What costs will my insurance cover?  Can my partner or support person be with me during the exam?  When can I expect to get the results? Summary  A prenatal ultrasound is a safe and painless imaging exam that gives information about your pregnancy and your developing baby.  Transvaginal ultrasound exams are often done in early pregnancy. Standard transabdominal ultrasounds are typically done between 18 and 22 weeks of pregnancy. You may have other prenatal ultrasounds as needed.  This exam has no risks for you or your baby. After the exam, you can go home and return to all your usual activities. This information is not intended to replace advice given to you by your health care provider. Make sure you discuss any questions you have with your health care provider. Document Released: 04/14/2017 Document Revised: 04/14/2017 Document Reviewed: 04/14/2017 Elsevier Interactive Patient Education  2019 [employer].

## 2018-08-03 NOTE — Progress Notes (Signed)
  Subjective  Fetal Movement? yes Contractions? no Leaking Fluid? no Vaginal Bleeding? no  Objective  BP 110/60   Wt 123 lb (55.8 kg)   LMP 03/01/2018 (Within Days)   BMI 23.24 kg/m  General: NAD Pumonary: no increased work of breathing Abdomen: gravid, non-tender Extremities: no edema Psychiatric: mood appropriate, affect full  Assessment  33 y.o. [id number] at [redacted]w[redacted]d by  12/14/2018, by Ultrasound presenting for routine prenatal visit  Plan   Problem List Items Addressed This Visit      Other   Supervision of high risk pregnancy, antepartum   History of preterm delivery, currently pregnant - Primary   History of cesarean delivery, currently pregnant    Other Visit Diagnoses    [redacted] weeks gestation of pregnancy          Pregnancy #3 Problems (from 03/01/18 to present)    Problem Noted Resolved   Supervision of high risk pregnancy, antepartum 04/26/2018 by Rexene Agent, CNM No   Overview Addendum 08/03/2018 11:41 AM by Gae Dry, MD    [facility] Prenatal Labs  Dating [place] Blood type: O/Positive/-- (03/03 1529)   Genetic Screen   NIPS:nml XY Antibody:Positive, See Final Results (03/03 1529)  Anatomic [place] 6/10, needs spine f/u Rubella: 1.69 (03/03 1529) Varicella:    GTT Third trimester:  RPR: Non Reactive (03/03 1529)   Rhogam n/a HBsAg: Negative (03/03 1529)   TDaP vaccine               Flu Shot:2020 HIV: Non Reactive (03/03 1529)   Baby Food                                GBS:   Contraception  Pap:04/2018  CBB  No   CS/VBAC CS  desired   Support Person Husband               Makena weekly  Review of ULTRASOUND. I have personally reviewed images and report of recent ultrasound done at [facility] There is a singleton gestation with subjectively normal amniotic fluid volume. The fetal biometry correlates with established dating. Detailed evaluation of the fetal anatomy was performed.The fetal anatomical survey appears within normal limits within the  resolution of ultrasound as described above.  It must be noted that a normal ultrasound is unable to rule out fetal aneuploidy.   Needs f/u Spine images [place] 26-28 weeks   Barnett Applebaum, MD, [facility], [facility] 08/03/2018  11:41 AM

## 2018-08-10 ENCOUNTER — Other Ambulatory Visit: Payer: Self-pay

## 2018-08-10 ENCOUNTER — Ambulatory Visit (INDEPENDENT_AMBULATORY_CARE_PROVIDER_SITE_OTHER): Payer: Medicaid Other

## 2018-08-10 DIAGNOSIS — O09899 Supervision of other high risk pregnancies, unspecified trimester: Secondary | ICD-10-CM

## 2018-08-10 DIAGNOSIS — Z3A22 22 weeks gestation of pregnancy: Secondary | ICD-10-CM | POA: Diagnosis not present

## 2018-08-10 DIAGNOSIS — O09212 Supervision of pregnancy with history of pre-term labor, second trimester: Secondary | ICD-10-CM

## 2018-08-10 MED ORDER — HYDROXYPROGESTERONE CAPROATE 250 MG/ML IM OIL
250.0000 mg | TOPICAL_OIL | Freq: Once | INTRAMUSCULAR | Status: AC
  Administered 2018-08-10: 250 mg via INTRAMUSCULAR

## 2018-08-10 NOTE — Progress Notes (Signed)
Patient presents today for Hydroxyprogesterone injection within dates. Given IM LUOQ. Patient tolerated well. °

## 2018-08-17 ENCOUNTER — Encounter: Payer: Medicaid Other | Admitting: Advanced Practice Midwife

## 2018-08-24 ENCOUNTER — Encounter: Payer: Medicaid Other | Admitting: Advanced Practice Midwife

## 2018-09-01 ENCOUNTER — Ambulatory Visit (INDEPENDENT_AMBULATORY_CARE_PROVIDER_SITE_OTHER): Payer: Medicaid Other | Admitting: Maternal Newborn

## 2018-09-01 ENCOUNTER — Encounter: Payer: Self-pay | Admitting: Maternal Newborn

## 2018-09-01 ENCOUNTER — Other Ambulatory Visit: Payer: Self-pay

## 2018-09-01 VITALS — BP 140/70 | Wt 124.4 lb

## 2018-09-01 DIAGNOSIS — Z8751 Personal history of pre-term labor: Secondary | ICD-10-CM

## 2018-09-01 DIAGNOSIS — O09212 Supervision of pregnancy with history of pre-term labor, second trimester: Secondary | ICD-10-CM | POA: Diagnosis not present

## 2018-09-01 DIAGNOSIS — O34219 Maternal care for unspecified type scar from previous cesarean delivery: Secondary | ICD-10-CM

## 2018-09-01 DIAGNOSIS — O0990 Supervision of high risk pregnancy, unspecified, unspecified trimester: Secondary | ICD-10-CM

## 2018-09-01 DIAGNOSIS — Z3A25 25 weeks gestation of pregnancy: Secondary | ICD-10-CM

## 2018-09-01 LAB — POCT URINALYSIS DIPSTICK OB: Glucose, UA: NEGATIVE

## 2018-09-01 MED ORDER — HYDROXYPROGESTERONE CAPROATE 250 MG/ML IM OIL
250.0000 mg | TOPICAL_OIL | Freq: Once | INTRAMUSCULAR | Status: AC
  Administered 2018-09-01: 250 mg via INTRAMUSCULAR

## 2018-09-01 NOTE — Progress Notes (Signed)
ROB- missed makena last week

## 2018-09-01 NOTE — Progress Notes (Signed)
    Routine Prenatal Care Visit  Subjective  Annette Roberts is a 33 y.o. [id number] at [redacted]w[redacted]d being seen today for ongoing prenatal care.  She is currently monitored for the following issues for this high-risk pregnancy and has Hilar lymphadenopathy; Tobacco dependence; Supervision of high risk pregnancy, antepartum; History of substance use; History of preterm delivery, currently pregnant; History of cesarean delivery, currently pregnant; and Pregnancy related nausea and vomiting, antepartum on their problem list.  ----------------------------------------------------------------------------------- Patient reports no complaints.   Contractions: Not present. Vag. Bleeding: None.  Movement: Present. No leaking of fluid.  ----------------------------------------------------------------------------------- The following portions of the patient's history were reviewed and updated as appropriate: allergies, current medications, past family history, past medical history, past social history, past surgical history and problem list. Problem list updated.   Objective  Last menstrual period 03/01/2018, unknown if currently breastfeeding. Pregravid weight 105 lb (47.6 kg) Total Weight Gain 19 lb 6.4 oz (8.8 kg) Urinalysis: Urine dipstick shows negative for glucose, positive for protein (trace).  Fetal Status: Fetal Heart Rate (bpm): 155 Fundal Height: 25 cm Movement: Present     General:  Alert, oriented and cooperative. Patient is in no acute distress.  Skin: Skin is warm and dry. No rash noted.   Cardiovascular: Normal heart rate noted  Respiratory: Normal respiratory effort, no problems with respiration noted  Abdomen: Soft, gravid, appropriate for gestational age. Pain/Pressure: Present     Pelvic:  Cervical exam deferred        Extremities: Normal range of motion.  Edema: None  Mental Status: Normal mood and affect. Normal behavior. Normal judgment and thought content.     Assessment   33 y.o.  [id number] at [redacted]w[redacted]d, EDD 12/14/2018 by Ultrasound presenting for a routine prenatal visit.  Plan   Pregnancy #3 Problems (from 03/01/18 to present)    Problem Noted Resolved   Supervision of high risk pregnancy, antepartum 04/26/2018 by Rexene Agent, CNM No   Overview Addendum 08/03/2018 11:41 AM by Gae Dry, MD    [facility] Prenatal Labs  Dating [place] Blood type: O/Positive/-- (03/03 1529)   Genetic Screen   NIPS:nml XY Antibody:Positive, See Final Results (03/03 1529)  Anatomic [place] 6/10, needs spine f/u Rubella: 1.69 (03/03 1529) Varicella:    GTT Third trimester:  RPR: Non Reactive (03/03 1529)   Rhogam n/a HBsAg: Negative (03/03 1529)   TDaP vaccine               Flu Shot:2020 HIV: Non Reactive (03/03 1529)   Baby Food                                GBS:   Contraception  Pap:04/2018  CBB  No   CS/VBAC CS  desired   Support Person Husband                 Preterm labor symptoms and general obstetric precautions including but not limited to vaginal bleeding, contractions, leaking of fluid and fetal movement were reviewed.  Please refer to After Visit Summary for other counseling recommendations.   Return in about 1 week (around 09/08/2018) for ROB with GTT/labs.  Avel Sensor, CNM 09/01/2018  4:44 PM

## 2018-09-08 ENCOUNTER — Other Ambulatory Visit: Payer: Self-pay

## 2018-09-08 ENCOUNTER — Ambulatory Visit (INDEPENDENT_AMBULATORY_CARE_PROVIDER_SITE_OTHER): Payer: Medicaid Other

## 2018-09-08 DIAGNOSIS — O09212 Supervision of pregnancy with history of pre-term labor, second trimester: Secondary | ICD-10-CM

## 2018-09-08 DIAGNOSIS — O09899 Supervision of other high risk pregnancies, unspecified trimester: Secondary | ICD-10-CM

## 2018-09-08 DIAGNOSIS — Z3A26 26 weeks gestation of pregnancy: Secondary | ICD-10-CM

## 2018-09-08 MED ORDER — HYDROXYPROGESTERONE CAPROATE 250 MG/ML IM OIL
250.0000 mg | TOPICAL_OIL | Freq: Once | INTRAMUSCULAR | Status: AC
  Administered 2018-09-08: 250 mg via INTRAMUSCULAR

## 2018-09-15 ENCOUNTER — Ambulatory Visit (INDEPENDENT_AMBULATORY_CARE_PROVIDER_SITE_OTHER): Payer: Medicaid Other

## 2018-09-15 ENCOUNTER — Other Ambulatory Visit: Payer: Self-pay

## 2018-09-15 DIAGNOSIS — O09213 Supervision of pregnancy with history of pre-term labor, third trimester: Secondary | ICD-10-CM | POA: Diagnosis not present

## 2018-09-15 DIAGNOSIS — O09893 Supervision of other high risk pregnancies, third trimester: Secondary | ICD-10-CM

## 2018-09-15 MED ORDER — HYDROXYPROGESTERONE CAPROATE 250 MG/ML IM OIL
250.0000 mg | TOPICAL_OIL | Freq: Once | INTRAMUSCULAR | Status: AC
  Administered 2018-09-15: 250 mg via INTRAMUSCULAR

## 2018-09-15 NOTE — Progress Notes (Signed)
Pt here for hydroxyprogesterone which was given IM left glut.  Sarita # [phone]

## 2018-09-19 ENCOUNTER — Telehealth: Payer: Self-pay

## 2018-09-19 ENCOUNTER — Other Ambulatory Visit: Payer: Self-pay | Admitting: Maternal Newborn

## 2018-09-19 DIAGNOSIS — O219 Vomiting of pregnancy, unspecified: Secondary | ICD-10-CM

## 2018-09-19 DIAGNOSIS — Z3689 Encounter for other specified antenatal screening: Secondary | ICD-10-CM

## 2018-09-19 MED ORDER — ONDANSETRON HCL 4 MG PO TABS: 4.0000 mg | ORAL_TABLET | Freq: Three times a day (TID) | ORAL | 0 refills | Status: DC | PRN

## 2018-09-19 NOTE — Telephone Encounter (Signed)
Sent Rx for Zofran to her pharmacy.

## 2018-09-19 NOTE — Telephone Encounter (Signed)
Trying to call pt back to let her know about RX, phone number not working. Does not ring or anything. Left message with pt .

## 2018-09-19 NOTE — Telephone Encounter (Signed)
Pt calling triage today, c/o very bad nausea and throwing up. Requesting something "stronger" for nausea. Is there something else that she can try that you could send in to her pharmacy?

## 2018-09-22 ENCOUNTER — Encounter: Payer: Medicaid Other | Admitting: Certified Nurse Midwife

## 2018-09-22 ENCOUNTER — Other Ambulatory Visit: Payer: Medicaid Other

## 2018-09-23 ENCOUNTER — Other Ambulatory Visit: Payer: Medicaid Other

## 2018-09-23 ENCOUNTER — Encounter: Payer: Self-pay | Admitting: Advanced Practice Midwife

## 2018-09-23 ENCOUNTER — Ambulatory Visit (INDEPENDENT_AMBULATORY_CARE_PROVIDER_SITE_OTHER): Payer: Medicaid Other

## 2018-09-23 ENCOUNTER — Other Ambulatory Visit: Payer: Self-pay

## 2018-09-23 ENCOUNTER — Ambulatory Visit (INDEPENDENT_AMBULATORY_CARE_PROVIDER_SITE_OTHER): Payer: Medicaid Other | Admitting: Advanced Practice Midwife

## 2018-09-23 VITALS — BP 128/64 | Wt 124.0 lb

## 2018-09-23 DIAGNOSIS — Z3A28 28 weeks gestation of pregnancy: Secondary | ICD-10-CM

## 2018-09-23 DIAGNOSIS — O09899 Supervision of other high risk pregnancies, unspecified trimester: Secondary | ICD-10-CM

## 2018-09-23 DIAGNOSIS — O09213 Supervision of pregnancy with history of pre-term labor, third trimester: Secondary | ICD-10-CM

## 2018-09-23 DIAGNOSIS — Z362 Encounter for other antenatal screening follow-up: Secondary | ICD-10-CM | POA: Diagnosis not present

## 2018-09-23 DIAGNOSIS — O0990 Supervision of high risk pregnancy, unspecified, unspecified trimester: Secondary | ICD-10-CM

## 2018-09-23 DIAGNOSIS — Z3689 Encounter for other specified antenatal screening: Secondary | ICD-10-CM

## 2018-09-23 MED ORDER — HYDROXYPROGESTERONE CAPROATE 250 MG/ML IM OIL
250.0000 mg | TOPICAL_OIL | Freq: Once | INTRAMUSCULAR | Status: AC
  Administered 2018-09-23: 10:00:00 250 mg via INTRAMUSCULAR

## 2018-09-23 NOTE — Patient Instructions (Signed)
Third Trimester of Pregnancy The third trimester is from week 28 through week 40 (months 7 through 9). The third trimester is a time when the unborn baby (fetus) is growing rapidly. At the end of the ninth month, the fetus is about 20 inches in length and weighs 6-10 pounds. Body changes during your third trimester Your body will continue to go through many changes during pregnancy. The changes vary from woman to woman. During the third trimester:  Your weight will continue to increase. You can expect to gain 25-35 pounds (11-16 kg) by the end of the pregnancy.  You may begin to get stretch marks on your hips, abdomen, and breasts.  You may urinate more often because the fetus is moving lower into your pelvis and pressing on your bladder.  You may develop or continue to have heartburn. This is caused by increased hormones that slow down muscles in the digestive tract.  You may develop or continue to have constipation because increased hormones slow digestion and cause the muscles that push waste through your intestines to relax.  You may develop hemorrhoids. These are swollen veins (varicose veins) in the rectum that can itch or be painful.  You may develop swollen, bulging veins (varicose veins) in your legs.  You may have increased body aches in the pelvis, back, or thighs. This is due to weight gain and increased hormones that are relaxing your joints.  You may have changes in your hair. These can include thickening of your hair, rapid growth, and changes in texture. Some women also have hair loss during or after pregnancy, or hair that feels dry or thin. Your hair will most likely return to normal after your baby is born.  Your breasts will continue to grow and they will continue to become tender. A yellow fluid (colostrum) may leak from your breasts. This is the first milk you are producing for your baby.  Your belly button may stick out.  You may notice more swelling in your hands,  face, or ankles.  You may have increased tingling or numbness in your hands, arms, and legs. The skin on your belly may also feel numb.  You may feel short of breath because of your expanding uterus.  You may have more problems sleeping. This can be caused by the size of your belly, increased need to urinate, and an increase in your body's metabolism.  You may notice the fetus "dropping," or moving lower in your abdomen (lightening).  You may have increased vaginal discharge.  You may notice your joints feel loose and you may have pain around your pelvic bone. What to expect at prenatal visits You will have prenatal exams every 2 weeks until week 36. Then you will have weekly prenatal exams. During a routine prenatal visit:  You will be weighed to make sure you and the baby are growing normally.  Your blood pressure will be taken.  Your abdomen will be measured to track your baby's growth.  The fetal heartbeat will be listened to.  Any test results from the previous visit will be discussed.  You may have a cervical check near your due date to see if your cervix has softened or thinned (effaced).  You will be tested for Group B streptococcus. This happens between 35 and 37 weeks. Your health care provider may ask you:  What your birth plan is.  How you are feeling.  If you are feeling the baby move.  If you have had any abnormal   symptoms, such as leaking fluid, bleeding, severe headaches, or abdominal cramping.  If you are using any tobacco products, including cigarettes, chewing tobacco, and electronic cigarettes.  If you have any questions. Other tests or screenings that may be performed during your third trimester include:  Blood tests that check for low iron levels (anemia).  Fetal testing to check the health, activity level, and growth of the fetus. Testing is done if you have certain medical conditions or if there are problems during the pregnancy.  Nonstress test  (NST). This test checks the health of your baby to make sure there are no signs of problems, such as the baby not getting enough oxygen. During this test, a belt is placed around your belly. The baby is made to move, and its heart rate is monitored during movement. What is false labor? False labor is a condition in which you feel small, irregular tightenings of the muscles in the womb (contractions) that usually go away with rest, changing position, or drinking water. These are called Braxton Hicks contractions. Contractions may last for hours, days, or even weeks before true labor sets in. If contractions come at regular intervals, become more frequent, increase in intensity, or become painful, you should see your health care provider. What are the signs of labor?  Abdominal cramps.  Regular contractions that start at 10 minutes apart and become stronger and more frequent with time.  Contractions that start on the top of the uterus and spread down to the lower abdomen and back.  Increased pelvic pressure and dull back pain.  A watery or bloody mucus discharge that comes from the vagina.  Leaking of amniotic fluid. This is also known as your "water breaking." It could be a slow trickle or a gush. Let your health care provider know if it has a color or strange odor. If you have any of these signs, call your health care provider right away, even if it is before your due date. Follow these instructions at home: Medicines  Follow your health care provider's instructions regarding medicine use. Specific medicines may be either safe or unsafe to take during pregnancy.  Take a prenatal vitamin that contains at least 600 micrograms (mcg) of folic acid.  If you develop constipation, try taking a stool softener if your health care provider approves. Eating and drinking   Eat a balanced diet that includes fresh fruits and vegetables, whole grains, good sources of protein such as meat, eggs, or tofu,  and low-fat dairy. Your health care provider will help you determine the amount of weight gain that is right for you.  Avoid raw meat and uncooked cheese. These carry germs that can cause birth defects in the baby.  If you have low calcium intake from food, talk to your health care provider about whether you should take a daily calcium supplement.  Eat four or five small meals rather than three large meals a day.  Limit foods that are high in fat and processed sugars, such as fried and sweet foods.  To prevent constipation: ? Drink enough fluid to keep your urine clear or pale yellow. ? Eat foods that are high in fiber, such as fresh fruits and vegetables, whole grains, and beans. Activity  Exercise only as directed by your health care provider. Most women can continue their usual exercise routine during pregnancy. Try to exercise for 30 minutes at least 5 days a week. Stop exercising if you experience uterine contractions.  Avoid heavy lifting.  Do   not exercise in extreme heat or humidity, or at high altitudes.  Wear low-heel, comfortable shoes.  Practice good posture.  You may continue to have sex unless your health care provider tells you otherwise. Relieving pain and discomfort  Take frequent breaks and rest with your legs elevated if you have leg cramps or low back pain.  Take warm sitz baths to soothe any pain or discomfort caused by hemorrhoids. Use hemorrhoid cream if your health care provider approves.  Wear a good support bra to prevent discomfort from breast tenderness.  If you develop varicose veins: ? Wear support pantyhose or compression stockings as told by your healthcare provider. ? Elevate your feet for 15 minutes, 3-4 times a day. Prenatal care  Write down your questions. Take them to your prenatal visits.  Keep all your prenatal visits as told by your health care provider. This is important. Safety  Wear your seat belt at all times when driving.  Make  a list of emergency phone numbers, including numbers for family, friends, the hospital, and police and fire departments. General instructions  Avoid cat litter boxes and soil used by cats. These carry germs that can cause birth defects in the baby. If you have a cat, ask someone to clean the litter box for you.  Do not travel far distances unless it is absolutely necessary and only with the approval of your health care provider.  Do not use hot tubs, steam rooms, or saunas.  Do not drink alcohol.  Do not use any products that contain nicotine or tobacco, such as cigarettes and e-cigarettes. If you need help quitting, ask your health care provider.  Do not use any medicinal herbs or unprescribed drugs. These chemicals affect the formation and growth of the baby.  Do not douche or use tampons or scented sanitary pads.  Do not cross your legs for long periods of time.  To prepare for the arrival of your baby: ? Take prenatal classes to understand, practice, and ask questions about labor and delivery. ? Make a trial run to the hospital. ? Visit the hospital and tour the maternity area. ? Arrange for maternity or paternity leave through employers. ? Arrange for family and friends to take care of pets while you are in the hospital. ? Purchase a rear-facing car seat and make sure you know how to install it in your car. ? Pack your hospital bag. ? Prepare the baby's nursery. Make sure to remove all pillows and stuffed animals from the baby's crib to prevent suffocation.  Visit your dentist if you have not gone during your pregnancy. Use a soft toothbrush to brush your teeth and be gentle when you floss. Contact a health care provider if:  You are unsure if you are in labor or if your water has broken.  You become dizzy.  You have mild pelvic cramps, pelvic pressure, or nagging pain in your abdominal area.  You have lower back pain.  You have persistent nausea, vomiting, or diarrhea.   You have an unusual or bad smelling vaginal discharge.  You have pain when you urinate. Get help right away if:  Your water breaks before 37 weeks.  You have regular contractions less than 5 minutes apart before 37 weeks.  You have a fever.  You are leaking fluid from your vagina.  You have spotting or bleeding from your vagina.  You have severe abdominal pain or cramping.  You have rapid weight loss or weight gain.  You have   shortness of breath with chest pain.  You notice sudden or extreme swelling of your face, hands, ankles, feet, or legs.  Your baby makes fewer than 10 movements in 2 hours.  You have severe headaches that do not go away when you take medicine.  You have vision changes. Summary  The third trimester is from week 28 through week 40, months 7 through 9. The third trimester is a time when the unborn baby (fetus) is growing rapidly.  During the third trimester, your discomfort may increase as you and your baby continue to gain weight. You may have abdominal, leg, and back pain, sleeping problems, and an increased need to urinate.  During the third trimester your breasts will keep growing and they will continue to become tender. A yellow fluid (colostrum) may leak from your breasts. This is the first milk you are producing for your baby.  False labor is a condition in which you feel small, irregular tightenings of the muscles in the womb (contractions) that eventually go away. These are called Braxton Hicks contractions. Contractions may last for hours, days, or even weeks before true labor sets in.  Signs of labor can include: abdominal cramps; regular contractions that start at 10 minutes apart and become stronger and more frequent with time; watery or bloody mucus discharge that comes from the vagina; increased pelvic pressure and dull back pain; and leaking of amniotic fluid. This information is not intended to replace advice given to you by your health  care provider. Make sure you discuss any questions you have with your health care provider. Document Released: 02/03/2001 Document Revised: 06/02/2018 Document Reviewed: 03/17/2016 Elsevier Patient Education  2020 [employer]  

## 2018-09-23 NOTE — Progress Notes (Addendum)
  Routine Prenatal Care Visit  Subjective  Annette Roberts is a 33 y.o. [id number] at [redacted]w[redacted]d being seen today for ongoing prenatal care.  She is currently monitored for the following issues for this high-risk pregnancy and has Hilar lymphadenopathy; Tobacco dependence; Supervision of high risk pregnancy, antepartum; History of substance use; History of preterm delivery, currently pregnant; History of cesarean delivery, currently pregnant; and Pregnancy related nausea and vomiting, antepartum on their problem list.  ----------------------------------------------------------------------------------- Patient reports heartburn.  She has had some cramping abdominal and back pain recently. Contractions: Not present. Vag. Bleeding: None.  Movement: Present. Denies leaking of fluid.  ----------------------------------------------------------------------------------- The following portions of the patient's history were reviewed and updated as appropriate: allergies, current medications, past family history, past medical history, past social history, past surgical history and problem list. Problem list updated.   Objective  Blood pressure 128/64, weight 124 lb (56.2 kg), last menstrual period 03/01/2018 Pregravid weight 105 lb (47.6 kg) Total Weight Gain 19 lb (8.618 kg) Urinalysis: Urine Protein    Urine Glucose  Dark yellow  Fetal Status: Fetal Heart Rate (bpm): 147 Fundal Height: 28 cm Movement: Present  Presentation: Vertex  Follow up anatomy is complete for spine today  General:  Alert, oriented and cooperative. Patient is in no acute distress.  Skin: Skin is warm and dry. No rash noted.   Cardiovascular: Normal heart rate noted  Respiratory: Normal respiratory effort, no problems with respiration noted  Abdomen: Soft, gravid, appropriate for gestational age. Pain/Pressure: Absent     Pelvic:  Cervical exam deferred        Extremities: Normal range of motion.  Edema: None  Mental Status: Normal mood  and affect. Normal behavior. Normal judgment and thought content.   Assessment   33 y.o. [id number] at [redacted]w[redacted]d by  12/14/2018, by Ultrasound presenting for routine prenatal visit  Plan   Pregnancy #3 Problems (from 03/01/18 to present)    Problem Noted Resolved   Supervision of high risk pregnancy, antepartum 04/26/2018 by Rexene Agent, CNM No   Overview Addendum 08/03/2018 11:41 AM by Gae Dry, MD    Clinic [facility] Prenatal Labs  Dating [place] Blood type: O/Positive/-- (03/03 1529)   Genetic Screen   NIPS:nml XY Antibody:Positive, See Final Results (03/03 1529)  Anatomic [place] 6/10, needs spine f/u Rubella: 1.69 (03/03 1529) Varicella:    GTT Third trimester:  RPR: Non Reactive (03/03 1529)   Rhogam n/a HBsAg: Negative (03/03 1529)   TDaP vaccine               Flu Shot:2020 HIV: Non Reactive (03/03 1529)   Baby Food                                GBS:   Contraception  Pap:04/2018  CBB  No   CS/VBAC CS  desired   Support Person Husband              Makena given today   Preterm labor symptoms and general obstetric precautions including but not limited to vaginal bleeding, contractions, leaking of fluid and fetal movement were reviewed in detail with the patient. Please refer to After Visit Summary for other counseling recommendations. Heartburn: TUMS, OTC antacid, milk, diluted apple cider vinegar, small frequent meals, sit up after eating  Cramping: increase hydration  Return in about 2 weeks (around 10/07/2018) for rob.  Rod Can, CNM 09/23/2018 10:18 AM

## 2018-09-23 NOTE — Progress Notes (Signed)
U/s today. No vb. No lof. 28 week labs today . Heartburn

## 2018-09-24 LAB — 28 WEEK RH+PANEL
Basophils Absolute: 0.1 10*3/uL (ref 0.0–0.2)
Basos: 1 %
EOS (ABSOLUTE): 0.3 10*3/uL (ref 0.0–0.4)
Eos: 3 %
Gestational Diabetes Screen: 94 mg/dL (ref 65–139)
HIV Screen 4th Generation wRfx: NONREACTIVE
Hematocrit: 34.7 % (ref 34.0–46.6)
Hemoglobin: 12 g/dL (ref 11.1–15.9)
Immature Grans (Abs): 0.1 10*3/uL (ref 0.0–0.1)
Immature Granulocytes: 1 %
Lymphocytes Absolute: 2.4 10*3/uL (ref 0.7–3.1)
Lymphs: 22 %
MCH: 30.5 pg (ref 26.6–33.0)
MCHC: 34.6 g/dL (ref 31.5–35.7)
MCV: 88 fL (ref 79–97)
Monocytes Absolute: 0.7 10*3/uL (ref 0.1–0.9)
Monocytes: 7 %
Neutrophils Absolute: 7.5 10*3/uL — ABNORMAL HIGH (ref 1.4–7.0)
Neutrophils: 66 %
Platelets: 136 10*3/uL — ABNORMAL LOW (ref 150–450)
RBC: 3.93 x10E6/uL (ref 3.77–5.28)
RDW: 13.2 % (ref 11.7–15.4)
RPR Ser Ql: NONREACTIVE
WBC: 11 10*3/uL — ABNORMAL HIGH (ref 3.4–10.8)

## 2018-09-29 ENCOUNTER — Ambulatory Visit (INDEPENDENT_AMBULATORY_CARE_PROVIDER_SITE_OTHER): Payer: Medicaid Other

## 2018-09-29 ENCOUNTER — Other Ambulatory Visit: Payer: Self-pay

## 2018-09-29 DIAGNOSIS — O09213 Supervision of pregnancy with history of pre-term labor, third trimester: Secondary | ICD-10-CM

## 2018-09-29 DIAGNOSIS — O09899 Supervision of other high risk pregnancies, unspecified trimester: Secondary | ICD-10-CM

## 2018-09-29 DIAGNOSIS — Z3A29 29 weeks gestation of pregnancy: Secondary | ICD-10-CM | POA: Diagnosis not present

## 2018-09-29 MED ORDER — HYDROXYPROGESTERONE CAPROATE 250 MG/ML IM OIL
250.0000 mg | TOPICAL_OIL | Freq: Once | INTRAMUSCULAR | Status: AC
  Administered 2018-09-29: 16:00:00 250 mg via INTRAMUSCULAR

## 2018-10-05 ENCOUNTER — Ambulatory Visit (INDEPENDENT_AMBULATORY_CARE_PROVIDER_SITE_OTHER): Payer: Medicaid Other

## 2018-10-05 ENCOUNTER — Other Ambulatory Visit: Payer: Self-pay

## 2018-10-05 DIAGNOSIS — Z3A30 30 weeks gestation of pregnancy: Secondary | ICD-10-CM | POA: Diagnosis not present

## 2018-10-05 DIAGNOSIS — O09213 Supervision of pregnancy with history of pre-term labor, third trimester: Secondary | ICD-10-CM | POA: Diagnosis not present

## 2018-10-05 MED ORDER — HYDROXYPROGESTERONE CAPROATE 250 MG/ML IM OIL
250.0000 mg | TOPICAL_OIL | Freq: Once | INTRAMUSCULAR | Status: AC
  Administered 2018-10-05: 250 mg via INTRAMUSCULAR

## 2018-10-05 NOTE — Progress Notes (Signed)
Pt here for her weekly hydroxyprogesterone inj which was given IM left glut.  NDC# [id number]

## 2018-10-14 ENCOUNTER — Other Ambulatory Visit: Payer: Self-pay

## 2018-10-14 ENCOUNTER — Encounter: Payer: Self-pay | Admitting: Advanced Practice Midwife

## 2018-10-14 ENCOUNTER — Ambulatory Visit (INDEPENDENT_AMBULATORY_CARE_PROVIDER_SITE_OTHER): Payer: Medicaid Other | Admitting: Advanced Practice Midwife

## 2018-10-14 VITALS — BP 110/70 | Wt 124.0 lb

## 2018-10-14 DIAGNOSIS — Z3A31 31 weeks gestation of pregnancy: Secondary | ICD-10-CM | POA: Diagnosis not present

## 2018-10-14 DIAGNOSIS — O212 Late vomiting of pregnancy: Secondary | ICD-10-CM

## 2018-10-14 DIAGNOSIS — O0990 Supervision of high risk pregnancy, unspecified, unspecified trimester: Secondary | ICD-10-CM

## 2018-10-14 DIAGNOSIS — Z23 Encounter for immunization: Secondary | ICD-10-CM

## 2018-10-14 DIAGNOSIS — O219 Vomiting of pregnancy, unspecified: Secondary | ICD-10-CM

## 2018-10-14 DIAGNOSIS — O09899 Supervision of other high risk pregnancies, unspecified trimester: Secondary | ICD-10-CM

## 2018-10-14 DIAGNOSIS — O09213 Supervision of pregnancy with history of pre-term labor, third trimester: Secondary | ICD-10-CM

## 2018-10-14 DIAGNOSIS — O34219 Maternal care for unspecified type scar from previous cesarean delivery: Secondary | ICD-10-CM

## 2018-10-14 DIAGNOSIS — O0993 Supervision of high risk pregnancy, unspecified, third trimester: Secondary | ICD-10-CM

## 2018-10-14 MED ORDER — ONDANSETRON HCL 4 MG PO TABS: 4.0000 mg | ORAL_TABLET | Freq: Three times a day (TID) | ORAL | 1 refills | Status: DC | PRN

## 2018-10-14 MED ORDER — HYDROXYPROGESTERONE CAPROATE 250 MG/ML IM OIL
250.0000 mg | TOPICAL_OIL | Freq: Once | INTRAMUSCULAR | Status: AC
  Administered 2018-10-14: 250 mg via INTRAMUSCULAR

## 2018-10-14 NOTE — Progress Notes (Signed)
ROB/17p C/o refill on nausea medication  Tdap/Bt consent

## 2018-10-14 NOTE — Addendum Note (Signed)
Addended by: Raechel Chute on: 10/14/2018 02:36 PM   Modules accepted: Orders

## 2018-10-14 NOTE — Progress Notes (Signed)
Routine Prenatal Care Visit  Subjective  Annette Roberts is a 33 y.o. [id number] at 737w2d being seen today for ongoing prenatal care.  She is currently monitored for the following issues for this high-risk pregnancy and has Hilar lymphadenopathy; Tobacco dependence; Supervision of high risk pregnancy, antepartum; History of substance use; History of preterm delivery, currently pregnant; History of cesarean delivery, currently pregnant; and Pregnancy related nausea and vomiting, antepartum on their problem list.  ----------------------------------------------------------------------------------- Patient reports occasional cramping and braxton hicks. She is questioning her lack of weight gain- says she is eating 3 regular meals and snacks in between. Likely burning up the calories with activity. She is having nausea in the mornings and evenings and doing well with zofran.   Contractions: Not present. Vag. Bleeding: None.  Movement: Present. Denies leaking of fluid.  ----------------------------------------------------------------------------------- The following portions of the patient's history were reviewed and updated as appropriate: allergies, current medications, past family history, past medical history, past social history, past surgical history and problem list. Problem list updated.   Objective  Blood pressure 110/70, weight 124 lb (56.2 kg), last menstrual period 03/01/2018, unknown if currently breastfeeding. Pregravid weight 105 lb (47.6 kg) Total Weight Gain 19 lb (8.618 kg) Urinalysis: Urine Protein    Urine Glucose    Fetal Status: Fetal Heart Rate (bpm): 126 Fundal Height: 31 cm Movement: Present     General:  Alert, oriented and cooperative. Patient is in no acute distress.  Skin: Skin is warm and dry. No rash noted.   Cardiovascular: Normal heart rate noted  Respiratory: Normal respiratory effort, no problems with respiration noted  Abdomen: Soft, gravid, appropriate for  gestational age. Pain/Pressure: Present     Pelvic:  Cervical exam deferred        Extremities: Normal range of motion.  Edema: None  Mental Status: Normal mood and affect. Normal behavior. Normal judgment and thought content.   Assessment   33 y.o. [id number] at 517w2d by  12/14/2018, by Ultrasound presenting for routine prenatal visit  Plan   Pregnancy #3 Problems (from 03/01/18 to present)    Problem Noted Resolved   Supervision of high risk pregnancy, antepartum 04/26/2018 by Oswaldo ConroySchmid, Jacelyn Y, CNM No   Overview Addendum 08/03/2018 11:41 AM by Nadara MustardHarris, Robert P, MD    [facility]inic [facility] Prenatal Labs  Dating US Blood type: O/Positive/-- (03/03 1529)   Genetic Screen   NIPS:nml XY Antibody:Positive, See Final Results (03/03 1529)  Anatomic US 6/10, needs spine f/u Rubella: 1.69 (03/03 1529) Varicella:    GTT Third trimester:  RPR: Non Reactive (03/03 1529)   Rhogam n/a HBsAg: Negative (03/03 1529)   TDaP vaccine               Flu Shot:2020 HIV: Non Reactive (03/03 1529)   Baby Food                                GBS:   Contraception  Pap:04/2018  CBB  No   CS/VBAC CS  desired   Support Person Husband                 Preterm labor symptoms and general obstetric precautions including but not limited to vaginal bleeding, contractions, leaking of fluid and fetal movement were reviewed in detail with the patient. Growth scan next visit- fundal height appropriate, but not gaining weight   Return in about 2 weeks (around 10/28/2018) for growth scan and rob/weekly  Makena injection.  Rod Can, CNM 10/14/2018 2:14 PM

## 2018-10-21 ENCOUNTER — Ambulatory Visit (INDEPENDENT_AMBULATORY_CARE_PROVIDER_SITE_OTHER): Payer: Medicaid Other

## 2018-10-21 ENCOUNTER — Other Ambulatory Visit: Payer: Self-pay

## 2018-10-21 DIAGNOSIS — Z3A32 32 weeks gestation of pregnancy: Secondary | ICD-10-CM | POA: Diagnosis not present

## 2018-10-21 DIAGNOSIS — O09213 Supervision of pregnancy with history of pre-term labor, third trimester: Secondary | ICD-10-CM

## 2018-10-21 DIAGNOSIS — O09899 Supervision of other high risk pregnancies, unspecified trimester: Secondary | ICD-10-CM

## 2018-10-21 MED ORDER — HYDROXYPROGESTERONE CAPROATE 250 MG/ML IM OIL
250.0000 mg | TOPICAL_OIL | Freq: Once | INTRAMUSCULAR | Status: AC
  Administered 2018-10-21: 16:00:00 250 mg via INTRAMUSCULAR

## 2018-10-28 ENCOUNTER — Observation Stay
Admission: EM | Admit: 2018-10-28 | Discharge: 2018-10-28 | Disposition: A | Discharge status: Home / Self Care | Payer: Medicaid Other | Attending: Obstetrics and Gynecology | Admitting: Obstetrics and Gynecology

## 2018-10-28 ENCOUNTER — Ambulatory Visit (INDEPENDENT_AMBULATORY_CARE_PROVIDER_SITE_OTHER): Payer: Medicaid Other

## 2018-10-28 ENCOUNTER — Other Ambulatory Visit: Payer: Self-pay

## 2018-10-28 ENCOUNTER — Encounter: Payer: Self-pay | Admitting: Advanced Practice Midwife

## 2018-10-28 ENCOUNTER — Ambulatory Visit (INDEPENDENT_AMBULATORY_CARE_PROVIDER_SITE_OTHER): Payer: Medicaid Other | Admitting: Advanced Practice Midwife

## 2018-10-28 VITALS — BP 120/80 | Wt 126.0 lb

## 2018-10-28 DIAGNOSIS — Z362 Encounter for other antenatal screening follow-up: Secondary | ICD-10-CM | POA: Diagnosis not present

## 2018-10-28 DIAGNOSIS — Z3A33 33 weeks gestation of pregnancy: Secondary | ICD-10-CM | POA: Diagnosis not present

## 2018-10-28 DIAGNOSIS — O99333 Smoking (tobacco) complicating pregnancy, third trimester: Secondary | ICD-10-CM | POA: Diagnosis not present

## 2018-10-28 DIAGNOSIS — Z79899 Other long term (current) drug therapy: Secondary | ICD-10-CM | POA: Diagnosis not present

## 2018-10-28 DIAGNOSIS — O09899 Supervision of other high risk pregnancies, unspecified trimester: Secondary | ICD-10-CM

## 2018-10-28 DIAGNOSIS — O0990 Supervision of high risk pregnancy, unspecified, unspecified trimester: Secondary | ICD-10-CM

## 2018-10-28 DIAGNOSIS — O09213 Supervision of pregnancy with history of pre-term labor, third trimester: Secondary | ICD-10-CM

## 2018-10-28 DIAGNOSIS — O34219 Maternal care for unspecified type scar from previous cesarean delivery: Secondary | ICD-10-CM

## 2018-10-28 DIAGNOSIS — F17210 Nicotine dependence, cigarettes, uncomplicated: Secondary | ICD-10-CM | POA: Diagnosis not present

## 2018-10-28 DIAGNOSIS — O368330 Maternal care for abnormalities of the fetal heart rate or rhythm, third trimester, not applicable or unspecified: Secondary | ICD-10-CM | POA: Diagnosis not present

## 2018-10-28 DIAGNOSIS — O288 Other abnormal findings on antenatal screening of mother: Secondary | ICD-10-CM | POA: Diagnosis not present

## 2018-10-28 MED ORDER — HYDROXYPROGESTERONE CAPROATE 250 MG/ML IM OIL
250.0000 mg | TOPICAL_OIL | Freq: Once | INTRAMUSCULAR | Status: AC
  Administered 2018-10-28: 16:00:00 250 mg via INTRAMUSCULAR

## 2018-10-28 NOTE — Discharge Summary (Signed)
See Final Progress note 

## 2018-10-28 NOTE — Progress Notes (Signed)
Routine Prenatal Care Visit  Subjective  Annette Roberts is a 33 y.o. G3P1102 at [redacted]w[redacted]d being seen today for ongoing prenatal care.  She is currently monitored for the following issues for this high-risk pregnancy and has Hilar lymphadenopathy; Tobacco dependence; Supervision of high risk pregnancy, antepartum; History of substance use; History of preterm delivery, currently pregnant; History of cesarean delivery, currently pregnant; and Pregnancy related nausea and vomiting, antepartum on their problem list.  ----------------------------------------------------------------------------------- Patient reports no complaints.   Contractions: Irregular. Vag. Bleeding: None.  Movement: Present. Leaking Fluid denies.  ----------------------------------------------------------------------------------- The following portions of the patient's history were reviewed and updated as appropriate: allergies, current medications, past family history, past medical history, past social history, past surgical history and problem list. Problem list updated.  Objective  Blood pressure 120/80, weight 126 lb (57.2 kg), last menstrual period 03/01/2018, unknown if currently breastfeeding. Pregravid weight 105 lb (47.6 kg) Total Weight Gain 21 lb (9.526 kg) Urinalysis: Urine Protein    Urine Glucose    Fetal Status: Fetal Heart Rate (bpm): 120 Fundal Height: 33 cm Movement: Present  Presentation: Vertex   Growth scan today: 25.7%, 4 pounds 3 ounces, AFI: 16.1 FHTones on doppler around 106 to 109. Placed patient on monitor. NST 30 minute tracing is non-reactive with baseline of 120, minimal variability, 10 x 10 accelerations times 2, variable to 80 bpm x 10 seconds with rapid return to baseline.   General:  Alert, oriented and cooperative. Patient is in no acute distress.  Skin: Skin is warm and dry. No rash noted.   Cardiovascular: Normal heart rate noted  Respiratory: Normal respiratory effort, no problems with  respiration noted  Abdomen: Soft, gravid, appropriate for gestational age. Pain/Pressure: Present     Pelvic:  Cervical exam deferred        Extremities: Normal range of motion.  Edema: None  Mental Status: Normal mood and affect. Normal behavior. Normal judgment and thought content.   Assessment   33 y.o. [id number] at [redacted]w[redacted]d by  12/14/2018, by Ultrasound presenting for routine prenatal visit  Plan   Pregnancy #3 Problems (from 03/01/18 to present)    Problem Noted Resolved   Supervision of high risk pregnancy, antepartum 04/26/2018 by Rexene Agent, CNM No   Overview Addendum 08/03/2018 11:41 AM by Gae Dry, MD    [facility]inic [facility] Prenatal Labs  Dating [place] Blood type: O/Positive/-- (03/03 1529)   Genetic Screen   NIPS:nml XY Antibody:Positive, See Final Results (03/03 1529)  Anatomic [place] 6/10, needs spine f/u Rubella: 1.69 (03/03 1529) Varicella:    GTT Third trimester:  RPR: Non Reactive (03/03 1529)   Rhogam n/a HBsAg: Negative (03/03 1529)   TDaP vaccine               Flu Shot:2020 HIV: Non Reactive (03/03 1529)   Baby Food                                GBS:   Contraception  Pap:04/2018  CBB  No   CS/VBAC CS  desired   Support Person Husband                 Preterm labor symptoms and general obstetric precautions including but not limited to vaginal bleeding, contractions, leaking of fluid and fetal movement were reviewed in detail with the patient. Go to L&D for further monitoring, IV fluids if needed  Return in about 2 weeks (around  11/11/2018) for rob.  Tresea MallJane Taima Rada, CNM 10/28/2018 4:14 PM

## 2018-10-28 NOTE — OB Triage Note (Signed)
Pt sent over from [facility] for non-reactive NST. Pt is a G3P1102 [redacted]w[redacted]d. Pt denies leaking of fluid or vaginal bleeding. Pt states positive fetal movement. External monitors applied and assessing. Initial FHT 115. VS WNL. S.Jackson, MD reviewed strip. Pt d/c home with follow up instructions.

## 2018-10-28 NOTE — Final Progress Note (Signed)
Physician Final Progress Note  Patient ID: Annette Roberts MRN: [id number] DOB/AGE: [birth year] 33 y.o.  Admit date: 10/28/2018 Admitting provider: Conard Novak, MD Discharge date: 10/28/2018   Admission Diagnoses:  1) intrauterine pregnancy at [redacted]w[redacted]d  2) non-reassuring antepartum testing in office  Discharge Diagnoses:  Active Problems:   Supervision of high risk pregnancy, antepartum   History of preterm delivery, currently pregnant   History of cesarean delivery, currently pregnant   Indication for care in labor and delivery, antepartum  Reassuring antepartum testing   History of Present Illness: The patient is a 33 y.o. female G3P1102 at [redacted]w[redacted]d who presents for non-reassuring antepartum testing in office.  She had a baseline of 115 bpm.  She did not have accelerations. She was sent to L&D for further evaluation. She notes +FM, no LOF, no vaginal bleeding, and no leaking of fluid.   Past Medical History:  Diagnosis Date  . Current smoker   . Depression    does not want anyone incl  FOB to know  . Late prenatal care    @ 23 wks  . Low grade squamous intraepithelial lesion (LGSIL) on cervical Pap smear   . Marijuana use   . Scoliosis     Past Surgical History:  Procedure Laterality Date  . CESAREAN SECTION  09/10/2009   FTP/FITL, meconium  . CESAREAN SECTION  05/23/15   repeat  . CESAREAN SECTION N/A 05/22/2015   Procedure: CESAREAN SECTION;  Surgeon: Conard Novak, MD;  Location: [facility] ORS;  Service: Obstetrics;  Laterality: N/A;    No current facility-administered medications on file prior to encounter.    Current Outpatient Medications on File Prior to Encounter  Medication Sig Dispense Refill  . ondansetron (ZOFRAN) 4 MG tablet Take 1 tablet (4 mg total) by mouth every 8 (eight) hours as needed for nausea or vomiting. 20 tablet 1  . Prenatal Multivit-Min-Fe-FA (PRENATAL VITAMINS PO) Take 1 tablet by mouth daily.    . hydroxyprogesterone caproate (MAKENA) 250 mg/mL  OIL injection Inject 1 mL (250 mg total) into the muscle once a week for 20 doses. 20 mL 0    No Known Allergies  Social History   Socioeconomic History  . Marital status: Single    Spouse name: Not on file  . Number of children: Not on file  . Years of education: Not on file  . Highest education level: Not on file  Occupational History  . Not on file  Social Needs  . Financial resource strain: Not on file  . Food insecurity    Worry: Not on file    Inability: Not on file  . Transportation needs    Medical: Not on file    Non-medical: Not on file  Tobacco Use  . Smoking status: Current Every Day Smoker    Packs/day: 0.50    Types: Cigarettes  . Smokeless tobacco: Never Used  Substance and Sexual Activity  . Alcohol use: No    Alcohol/week: 0.0 standard drinks  . Drug use: Yes    Types: Marijuana  . Sexual activity: Yes    Birth control/protection: Implant  Lifestyle  . Physical activity    Days per week: Not on file    Minutes per session: Not on file  . Stress: Not on file  Relationships  . Social Musician on phone: Not on file    Gets together: Not on file    Attends religious service: Not on file  Active member of club or organization: Not on file    Attends meetings of clubs or organizations: Not on file    Relationship status: Not on file  . Intimate partner violence    Fear of current or ex partner: Not on file    Emotionally abused: Not on file    Physically abused: Not on file    Forced sexual activity: Not on file  Other Topics Concern  . Not on file  Social History Narrative  . Not on file   Family History: denies history of gynecologic cancer  Review of Systems  Constitutional: Negative.   HENT: Negative.   Eyes: Negative.   Respiratory: Negative.   Cardiovascular: Negative.   Gastrointestinal: Negative.   Genitourinary: Negative.   Musculoskeletal: Negative.   Skin: Negative.   Neurological: Negative.    Psychiatric/Behavioral: Negative.      Physical Exam: BP 119/73 (BP Location: Left Arm)   Pulse 69   Temp 98.6 F (37 C) (Oral)   Resp 18   Ht 5\' 1"  (1.549 m)   Wt 57.2 kg   LMP 03/01/2018 (Within Days)   SpO2 98%   BMI 23.81 kg/m   Physical Exam Constitutional:      General: She is not in acute distress.    Appearance: Normal appearance.  HENT:     Head: Normocephalic and atraumatic.  Eyes:     General: No scleral icterus.    Conjunctiva/sclera: Conjunctivae normal.  Pulmonary:     Effort: No respiratory distress.  Neurological:     General: No focal deficit present.     Mental Status: She is alert and oriented to person, place, and time.     Cranial Nerves: No cranial nerve deficit.  Psychiatric:        Mood and Affect: Mood normal.        Behavior: Behavior normal.        Judgment: Judgment normal.     Consults: None  Significant Findings/ Diagnostic Studies: none  Procedures:  NST: Baseline FHR: 115 beats/min Variability: moderate Accelerations: present Decelerations: absent Tocometry: quiet  Interpretation:  INDICATIONS: non-reassuring antepartum testing in office RESULTS:  A NST procedure was performed with FHR monitoring and a normal baseline established, appropriate time of 20-40 minutes of evaluation, and accels >2 seen w 15x15 characteristics.  Results show a REACTIVE NST.    Hospital Course: The patient was admitted to Labor and Delivery Triage for observation. The patient had a quickly reactive NST over 1 hour of monitoring. She had normal vitals signs. She had no complaints. She was discharged in stable condition with precautions and she was given direct instructions on kick counts.   Discharge Condition: stable  Disposition: Discharge disposition: 01-Home or Self Care       Diet: Regular diet  Discharge Activity: Activity as tolerated   Allergies as of 10/28/2018   No Known Allergies     Medication List    TAKE these medications    hydroxyprogesterone caproate 250 mg/mL Oil injection Commonly known as: Makena Inject 1 mL (250 mg total) into the muscle once a week for 20 doses.   ondansetron 4 MG tablet Commonly known as: Zofran Take 1 tablet (4 mg total) by mouth every 8 (eight) hours as needed for nausea or vomiting.   PRENATAL VITAMINS PO Take 1 tablet by mouth daily.      Rayne. Schedule an appointment as soon as possible for a visit in  1 week(s).   Specialty: Obstetrics and Gynecology Why: Routine prenatal with NST Contact information: [address] [place] [phone]          Total time spent taking care of this patient: 30 minutes  Signed: Thomasene MohairStephen Aubry Rankin, MD  10/28/2018, 6:14 PM

## 2018-11-03 ENCOUNTER — Telehealth: Payer: Self-pay | Admitting: Obstetrics & Gynecology

## 2018-11-03 NOTE — Telephone Encounter (Signed)
Called and left voice mail for patient to call back to be schedule °

## 2018-11-03 NOTE — Telephone Encounter (Signed)
-----   Message from Cando, LPN sent at 08/07/1832 11:59 AM EDT ----- Regarding: 17p Patients new shipment of Makena/17p arrived. She doesn't have an apt this week for her injection. She needs an apt tomorrow as she usually gets her injections on Friday. Her next scheduled apt isn't until 11/11/2018 (Next Friday). Please contact pt to schedule apt for injection tomorrow. Thanks!

## 2018-11-04 ENCOUNTER — Ambulatory Visit (INDEPENDENT_AMBULATORY_CARE_PROVIDER_SITE_OTHER): Payer: Medicaid Other

## 2018-11-04 ENCOUNTER — Other Ambulatory Visit: Payer: Self-pay

## 2018-11-04 DIAGNOSIS — O09899 Supervision of other high risk pregnancies, unspecified trimester: Secondary | ICD-10-CM

## 2018-11-04 DIAGNOSIS — O09213 Supervision of pregnancy with history of pre-term labor, third trimester: Secondary | ICD-10-CM

## 2018-11-04 DIAGNOSIS — O219 Vomiting of pregnancy, unspecified: Secondary | ICD-10-CM

## 2018-11-04 DIAGNOSIS — Z3A34 34 weeks gestation of pregnancy: Secondary | ICD-10-CM | POA: Diagnosis not present

## 2018-11-04 MED ORDER — HYDROXYPROGESTERONE CAPROATE 250 MG/ML IM OIL
250.0000 mg | TOPICAL_OIL | Freq: Once | INTRAMUSCULAR | Status: AC
  Administered 2018-11-04: 250 mg via INTRAMUSCULAR

## 2018-11-04 MED ORDER — ONDANSETRON HCL 4 MG PO TABS: 4.0000 mg | ORAL_TABLET | Freq: Three times a day (TID) | ORAL | 1 refills | Status: DC | PRN

## 2018-11-11 ENCOUNTER — Ambulatory Visit (INDEPENDENT_AMBULATORY_CARE_PROVIDER_SITE_OTHER): Payer: Medicaid Other | Admitting: Advanced Practice Midwife

## 2018-11-11 ENCOUNTER — Encounter: Payer: Self-pay | Admitting: Advanced Practice Midwife

## 2018-11-11 ENCOUNTER — Other Ambulatory Visit: Payer: Self-pay

## 2018-11-11 VITALS — BP 126/62 | Wt 131.0 lb

## 2018-11-11 DIAGNOSIS — O09893 Supervision of other high risk pregnancies, third trimester: Secondary | ICD-10-CM

## 2018-11-11 DIAGNOSIS — Z3A35 35 weeks gestation of pregnancy: Secondary | ICD-10-CM

## 2018-11-11 DIAGNOSIS — O09213 Supervision of pregnancy with history of pre-term labor, third trimester: Secondary | ICD-10-CM | POA: Diagnosis not present

## 2018-11-11 LAB — POCT URINALYSIS DIPSTICK OB
Glucose, UA: NEGATIVE
POC,PROTEIN,UA: NEGATIVE

## 2018-11-11 MED ORDER — HYDROXYPROGESTERONE CAPROATE 250 MG/ML IM OIL
250.0000 mg | TOPICAL_OIL | Freq: Once | INTRAMUSCULAR | Status: AC
  Administered 2018-11-11: 250 mg via INTRAMUSCULAR

## 2018-11-11 NOTE — Progress Notes (Signed)
ROB Makena injection

## 2018-11-11 NOTE — Telephone Encounter (Signed)
Patient is schedule 11/11/18

## 2018-11-11 NOTE — Progress Notes (Signed)
  Routine Prenatal Care Visit  Subjective  Annette Roberts is a 33 y.o. G3P1102 at [redacted]w[redacted]d being seen today for ongoing prenatal care.  She is currently monitored for the following issues for this high-risk pregnancy and has Hilar lymphadenopathy; Tobacco dependence; Supervision of high risk pregnancy, antepartum; History of substance use; History of preterm delivery, currently pregnant; History of cesarean delivery, currently pregnant; Pregnancy related nausea and vomiting, antepartum; and Indication for care in labor and delivery, antepartum on their problem list.  ----------------------------------------------------------------------------------- Patient reports no complaints.   Contractions: Irregular. Vag. Bleeding: None.  Movement: Present. Leaking Fluid denies.  ----------------------------------------------------------------------------------- The following portions of the patient's history were reviewed and updated as appropriate: allergies, current medications, past family history, past medical history, past social history, past surgical history and problem list. Problem list updated.  Objective  Blood pressure 126/62, weight 131 lb (59.4 kg), last menstrual period 03/01/2018, unknown if currently breastfeeding. Pregravid weight 105 lb (47.6 kg) Total Weight Gain 26 lb (11.8 kg) Urinalysis: Urine Protein Negative  Urine Glucose Negative  Fetal Status: Fetal Heart Rate (bpm): 118 Fundal Height: 35 cm Movement: Present     General:  Alert, oriented and cooperative. Patient is in no acute distress.  Skin: Skin is warm and dry. No rash noted.   Cardiovascular: Normal heart rate noted  Respiratory: Normal respiratory effort, no problems with respiration noted  Abdomen: Soft, gravid, appropriate for gestational age. Pain/Pressure: Present     Pelvic:  Cervical exam deferred        Extremities: Normal range of motion.  Edema: None  Mental Status: Normal mood and affect. Normal behavior.  Normal judgment and thought content.   Assessment   33 y.o. [id number] at [redacted]w[redacted]d by  12/14/2018, by Ultrasound presenting for routine prenatal visit  Plan   Pregnancy #3 Problems (from 03/01/18 to present)    Problem Noted Resolved   Supervision of high risk pregnancy, antepartum 04/26/2018 by Rexene Agent, CNM No   Overview Addendum 08/03/2018 11:41 AM by Gae Dry, MD    [facility] Prenatal Labs  Dating [place] Blood type: O/Positive/-- (03/03 1529)   Genetic Screen   NIPS:nml XY Antibody:Positive, See Final Results (03/03 1529)  Anatomic [place] 6/10, needs spine f/u Rubella: 1.69 (03/03 1529) Varicella:    GTT Third trimester:  RPR: Non Reactive (03/03 1529)   Rhogam n/a HBsAg: Negative (03/03 1529)   TDaP vaccine               Flu Shot:2020 HIV: Non Reactive (03/03 1529)   Baby Food                                GBS:   Contraception  Pap:04/2018  CBB  No   CS/VBAC CS  desired   Support Person Husband                 Preterm labor symptoms and general obstetric precautions including but not limited to vaginal bleeding, contractions, leaking of fluid and fetal movement were reviewed in detail with the patient. Please refer to After Visit Summary for other counseling recommendations.   Return in about 1 week (around 11/18/2018) for rob/last 17P injection.  Rod Can, CNM 11/11/2018 3:47 PM

## 2018-11-21 ENCOUNTER — Encounter: Payer: Medicaid Other | Admitting: Obstetrics & Gynecology

## 2018-11-29 ENCOUNTER — Inpatient Hospital Stay: Payer: Medicaid Other | Admitting: Anesthesiology

## 2018-11-29 ENCOUNTER — Encounter: Payer: Self-pay | Admitting: Certified Nurse Midwife

## 2018-11-29 ENCOUNTER — Encounter
Admission: EM | Disposition: A | Discharge status: Home / Self Care | Payer: Self-pay | Source: Home / Self Care | Attending: Certified Nurse Midwife

## 2018-11-29 ENCOUNTER — Other Ambulatory Visit: Payer: Self-pay

## 2018-11-29 ENCOUNTER — Inpatient Hospital Stay
Admission: EM | Admit: 2018-11-29 | Discharge: 2018-12-01 | DRG: 787 | Disposition: A | Discharge status: Home / Self Care | Payer: Medicaid Other | Attending: Certified Nurse Midwife | Admitting: Certified Nurse Midwife

## 2018-11-29 DIAGNOSIS — D62 Acute posthemorrhagic anemia: Secondary | ICD-10-CM | POA: Diagnosis not present

## 2018-11-29 DIAGNOSIS — R87612 Low grade squamous intraepithelial lesion on cytologic smear of cervix (LGSIL): Secondary | ICD-10-CM | POA: Diagnosis present

## 2018-11-29 DIAGNOSIS — O34211 Maternal care for low transverse scar from previous cesarean delivery: Secondary | ICD-10-CM | POA: Diagnosis present

## 2018-11-29 DIAGNOSIS — O34219 Maternal care for unspecified type scar from previous cesarean delivery: Secondary | ICD-10-CM

## 2018-11-29 DIAGNOSIS — O99334 Smoking (tobacco) complicating childbirth: Secondary | ICD-10-CM | POA: Diagnosis present

## 2018-11-29 DIAGNOSIS — O9081 Anemia of the puerperium: Secondary | ICD-10-CM | POA: Diagnosis not present

## 2018-11-29 DIAGNOSIS — F1190 Opioid use, unspecified, uncomplicated: Secondary | ICD-10-CM | POA: Diagnosis present

## 2018-11-29 DIAGNOSIS — F1290 Cannabis use, unspecified, uncomplicated: Secondary | ICD-10-CM | POA: Diagnosis present

## 2018-11-29 DIAGNOSIS — Z87898 Personal history of other specified conditions: Secondary | ICD-10-CM

## 2018-11-29 DIAGNOSIS — O0990 Supervision of high risk pregnancy, unspecified, unspecified trimester: Secondary | ICD-10-CM

## 2018-11-29 DIAGNOSIS — O4202 Full-term premature rupture of membranes, onset of labor within 24 hours of rupture: Secondary | ICD-10-CM | POA: Diagnosis not present

## 2018-11-29 DIAGNOSIS — O99324 Drug use complicating childbirth: Secondary | ICD-10-CM | POA: Diagnosis present

## 2018-11-29 DIAGNOSIS — F17210 Nicotine dependence, cigarettes, uncomplicated: Secondary | ICD-10-CM | POA: Diagnosis present

## 2018-11-29 DIAGNOSIS — O4292 Full-term premature rupture of membranes, unspecified as to length of time between rupture and onset of labor: Principal | ICD-10-CM | POA: Diagnosis present

## 2018-11-29 DIAGNOSIS — Z3A37 37 weeks gestation of pregnancy: Secondary | ICD-10-CM | POA: Diagnosis not present

## 2018-11-29 DIAGNOSIS — Z20828 Contact with and (suspected) exposure to other viral communicable diseases: Secondary | ICD-10-CM | POA: Diagnosis present

## 2018-11-29 DIAGNOSIS — Z98891 History of uterine scar from previous surgery: Secondary | ICD-10-CM

## 2018-11-29 DIAGNOSIS — O09899 Supervision of other high risk pregnancies, unspecified trimester: Secondary | ICD-10-CM

## 2018-11-29 HISTORY — DX: Personal history of other complications of pregnancy, childbirth and the puerperium: Z87.59

## 2018-11-29 HISTORY — DX: Opioid dependence, uncomplicated: F11.20

## 2018-11-29 LAB — COMPREHENSIVE METABOLIC PANEL
ALT: 18 U/L (ref 0–44)
AST: 29 U/L (ref 15–41)
Albumin: 2.7 g/dL — ABNORMAL LOW (ref 3.5–5.0)
Alkaline Phosphatase: 208 U/L — ABNORMAL HIGH (ref 38–126)
Anion gap: 6 (ref 5–15)
BUN: 6 mg/dL (ref 6–20)
CO2: 22 mmol/L (ref 22–32)
Calcium: 8.2 mg/dL — ABNORMAL LOW (ref 8.9–10.3)
Chloride: 99 mmol/L (ref 98–111)
Creatinine, Ser: 0.59 mg/dL (ref 0.44–1.00)
GFR calc Af Amer: >60 mL/min (ref >60)
GFR calc non Af Amer: >60 mL/min (ref >60)
Glucose, Bld: 78 mg/dL (ref 70–99)
Potassium: 3.6 mmol/L (ref 3.5–5.1)
Sodium: 127 mmol/L — ABNORMAL LOW (ref 135–145)
Total Bilirubin: 0.5 mg/dL (ref 0.3–1.2)
Total Protein: 6.1 g/dL — ABNORMAL LOW (ref 6.5–8.1)

## 2018-11-29 LAB — URINE DRUG SCREEN, QUALITATIVE (ARMC ONLY)
Amphetamines, Ur Screen: NOT DETECTED
Barbiturates, Ur Screen: NOT DETECTED
Benzodiazepine, Ur Scrn: POSITIVE — AB
Cannabinoid 50 Ng, Ur ~~LOC~~: POSITIVE — AB
Cocaine Metabolite,Ur ~~LOC~~: NOT DETECTED
MDMA (Ecstasy)Ur Screen: NOT DETECTED
Methadone Scn, Ur: POSITIVE — AB
Opiate, Ur Screen: NOT DETECTED
Phencyclidine (PCP) Ur S: NOT DETECTED
Tricyclic, Ur Screen: NOT DETECTED

## 2018-11-29 LAB — CBC WITH DIFFERENTIAL/PLATELET
Abs Immature Granulocytes: 0.07 10*3/uL (ref 0.00–0.07)
Basophils Absolute: 0 10*3/uL (ref 0.0–0.1)
Basophils Relative: 0 %
Eosinophils Absolute: 0.1 10*3/uL (ref 0.0–0.5)
Eosinophils Relative: 1 %
HCT: 33.7 % — ABNORMAL LOW (ref 36.0–46.0)
Hemoglobin: 11.7 g/dL — ABNORMAL LOW (ref 12.0–15.0)
Immature Granulocytes: 1 %
Lymphocytes Relative: 24 %
Lymphs Abs: 2.7 10*3/uL (ref 0.7–4.0)
MCH: 30.4 pg (ref 26.0–34.0)
MCHC: 34.7 g/dL (ref 30.0–36.0)
MCV: 87.5 fL (ref 80.0–100.0)
Monocytes Absolute: 0.6 10*3/uL (ref 0.1–1.0)
Monocytes Relative: 5 %
Neutro Abs: 7.6 10*3/uL (ref 1.7–7.7)
Neutrophils Relative %: 69 %
Platelets: 151 10*3/uL (ref 150–400)
RBC: 3.85 MIL/uL — ABNORMAL LOW (ref 3.87–5.11)
RDW: 12.7 % (ref 11.5–15.5)
WBC: 11.1 10*3/uL — ABNORMAL HIGH (ref 4.0–10.5)
nRBC: 0 % (ref 0.0–0.2)

## 2018-11-29 LAB — GROUP B STREP BY PCR: Group B strep by PCR: NEGATIVE

## 2018-11-29 LAB — SARS CORONAVIRUS 2 BY RT PCR (HOSPITAL ORDER, PERFORMED IN ~~LOC~~ HOSPITAL LAB): SARS Coronavirus 2: NEGATIVE

## 2018-11-29 LAB — RPR: RPR Ser Ql: NONREACTIVE

## 2018-11-29 SURGERY — Surgical Case
Anesthesia: Spinal

## 2018-11-29 MED ORDER — OXYTOCIN 40 UNITS IN NORMAL SALINE INFUSION - SIMPLE MED
INTRAVENOUS | Status: AC
  Filled 2018-11-29: qty 1000

## 2018-11-29 MED ORDER — SIMETHICONE 80 MG PO CHEW
80.0000 mg | CHEWABLE_TABLET | Freq: Three times a day (TID) | ORAL | Status: DC
  Administered 2018-11-29 – 2018-12-01 (×8): 80 mg via ORAL
  Filled 2018-11-29 (×8): qty 1

## 2018-11-29 MED ORDER — OXYCODONE-ACETAMINOPHEN 5-325 MG PO TABS
ORAL_TABLET | ORAL | Status: AC
  Filled 2018-11-29: qty 1

## 2018-11-29 MED ORDER — BUPIVACAINE IN DEXTROSE 0.75-8.25 % IT SOLN
INTRATHECAL | Status: DC | PRN
  Administered 2018-11-29: 1.2 mL via INTRATHECAL

## 2018-11-29 MED ORDER — DIPHENHYDRAMINE HCL 50 MG/ML IJ SOLN: 12.5000 mg | INTRAMUSCULAR | Status: DC | PRN

## 2018-11-29 MED ORDER — OXYCODONE-ACETAMINOPHEN 5-325 MG PO TABS
2.0000 | ORAL_TABLET | ORAL | Status: DC | PRN
  Administered 2018-11-29 – 2018-12-01 (×13): 2 via ORAL
  Filled 2018-11-29 (×13): qty 2

## 2018-11-29 MED ORDER — NALBUPHINE HCL 10 MG/ML IJ SOLN: 5.0000 mg | INTRAMUSCULAR | Status: DC | PRN

## 2018-11-29 MED ORDER — MORPHINE SULFATE (PF) 0.5 MG/ML IJ SOLN
INTRAMUSCULAR | Status: AC
  Filled 2018-11-29: qty 10

## 2018-11-29 MED ORDER — FENTANYL CITRATE (PF) 100 MCG/2ML IJ SOLN
INTRAMUSCULAR | Status: AC
  Filled 2018-11-29: qty 2

## 2018-11-29 MED ORDER — FENTANYL CITRATE (PF) 100 MCG/2ML IJ SOLN
50.0000 ug | Freq: Once | INTRAMUSCULAR | Status: AC
  Administered 2018-11-29: 50 ug via INTRAVENOUS

## 2018-11-29 MED ORDER — KETOROLAC TROMETHAMINE 30 MG/ML IJ SOLN: 30.0000 mg | Freq: Four times a day (QID) | INTRAMUSCULAR | Status: AC | PRN

## 2018-11-29 MED ORDER — ONDANSETRON HCL 4 MG/2ML IJ SOLN
INTRAMUSCULAR | Status: AC
  Filled 2018-11-29: qty 2

## 2018-11-29 MED ORDER — ACETAMINOPHEN 500 MG PO TABS
1000.0000 mg | ORAL_TABLET | Freq: Four times a day (QID) | ORAL | Status: DC
  Filled 2018-11-29: qty 2

## 2018-11-29 MED ORDER — COCONUT OIL OIL: 1.0000 "application " | TOPICAL_OIL | Status: DC | PRN

## 2018-11-29 MED ORDER — OXYTOCIN 40 UNITS IN NORMAL SALINE INFUSION - SIMPLE MED
INTRAVENOUS | Status: DC | PRN
  Administered 2018-11-29: 1 mL via INTRAVENOUS

## 2018-11-29 MED ORDER — LACTATED RINGERS IV SOLN: INTRAVENOUS | Status: DC

## 2018-11-29 MED ORDER — OXYCODONE-ACETAMINOPHEN 5-325 MG PO TABS: 1.0000 | ORAL_TABLET | ORAL | Status: DC | PRN

## 2018-11-29 MED ORDER — ONDANSETRON HCL 4 MG/2ML IJ SOLN: 4.0000 mg | Freq: Three times a day (TID) | INTRAMUSCULAR | Status: DC | PRN

## 2018-11-29 MED ORDER — BUPIVACAINE HCL (PF) 0.5 % IJ SOLN
10.0000 mL | Freq: Once | INTRAMUSCULAR | Status: DC
  Filled 2018-11-29: qty 30

## 2018-11-29 MED ORDER — SODIUM CHLORIDE 0.9 % IV SOLN
INTRAVENOUS | Status: DC | PRN
  Administered 2018-11-29: 20 ug/min via INTRAVENOUS

## 2018-11-29 MED ORDER — METHADONE HCL 10 MG/ML PO CONC
95.0000 mg | Freq: Every day | ORAL | Status: DC
  Administered 2018-11-29 – 2018-12-01 (×3): 95 mg via ORAL
  Filled 2018-11-29 (×3): qty 9.5

## 2018-11-29 MED ORDER — BUPIVACAINE HCL 0.5 % IJ SOLN
INTRAMUSCULAR | Status: DC | PRN
  Administered 2018-11-29: 10 mL

## 2018-11-29 MED ORDER — OXYCODONE-ACETAMINOPHEN 5-325 MG PO TABS: 2.0000 | ORAL_TABLET | ORAL | Status: DC | PRN

## 2018-11-29 MED ORDER — DIBUCAINE (PERIANAL) 1 % EX OINT: 1.0000 "application " | TOPICAL_OINTMENT | CUTANEOUS | Status: DC | PRN

## 2018-11-29 MED ORDER — DIPHENHYDRAMINE HCL 25 MG PO CAPS: 25.0000 mg | ORAL_CAPSULE | Freq: Four times a day (QID) | ORAL | Status: DC | PRN

## 2018-11-29 MED ORDER — KETAMINE HCL 50 MG/ML IJ SOLN
INTRAMUSCULAR | Status: AC
  Filled 2018-11-29: qty 10

## 2018-11-29 MED ORDER — ONDANSETRON HCL 4 MG/2ML IJ SOLN
4.0000 mg | Freq: Four times a day (QID) | INTRAMUSCULAR | Status: DC | PRN
  Administered 2018-11-29: 4 mg via INTRAVENOUS

## 2018-11-29 MED ORDER — MIDAZOLAM HCL 2 MG/2ML IJ SOLN
INTRAMUSCULAR | Status: DC | PRN
  Administered 2018-11-29 (×2): 1 mg via INTRAVENOUS

## 2018-11-29 MED ORDER — ONDANSETRON HCL 4 MG/2ML IJ SOLN
INTRAMUSCULAR | Status: DC | PRN
  Administered 2018-11-29: 4 mg via INTRAVENOUS

## 2018-11-29 MED ORDER — SENNOSIDES-DOCUSATE SODIUM 8.6-50 MG PO TABS
2.0000 | ORAL_TABLET | ORAL | Status: DC
  Administered 2018-11-29 – 2018-12-01 (×3): 2 via ORAL
  Filled 2018-11-29 (×3): qty 2

## 2018-11-29 MED ORDER — FERROUS SULFATE 325 (65 FE) MG PO TABS
325.0000 mg | ORAL_TABLET | Freq: Two times a day (BID) | ORAL | Status: DC
  Administered 2018-11-29 – 2018-12-01 (×5): 325 mg via ORAL
  Filled 2018-11-29 (×5): qty 1

## 2018-11-29 MED ORDER — BUPIVACAINE 0.25 % ON-Q PUMP DUAL CATH 400 ML
400.0000 mL | INJECTION | Status: DC
  Filled 2018-11-29: qty 400

## 2018-11-29 MED ORDER — NALOXONE HCL 0.4 MG/ML IJ SOLN: 0.4000 mg | INTRAMUSCULAR | Status: DC | PRN

## 2018-11-29 MED ORDER — PRENATAL MULTIVITAMIN CH
1.0000 | ORAL_TABLET | Freq: Every day | ORAL | Status: DC
  Administered 2018-11-29 – 2018-12-01 (×3): 1 via ORAL
  Filled 2018-11-29 (×3): qty 1

## 2018-11-29 MED ORDER — OXYTOCIN 40 UNITS IN NORMAL SALINE INFUSION - SIMPLE MED
2.5000 [IU]/h | INTRAVENOUS | Status: AC
  Administered 2018-11-29 (×2): 2.5 [IU]/h via INTRAVENOUS
  Filled 2018-11-29: qty 1000

## 2018-11-29 MED ORDER — IBUPROFEN 600 MG PO TABS
600.0000 mg | ORAL_TABLET | Freq: Four times a day (QID) | ORAL | Status: DC
  Administered 2018-11-30 – 2018-12-01 (×7): 600 mg via ORAL
  Filled 2018-11-29 (×7): qty 1

## 2018-11-29 MED ORDER — MORPHINE SULFATE (PF) 0.5 MG/ML IJ SOLN
INTRAMUSCULAR | Status: DC | PRN
  Administered 2018-11-29: .2 mg via EPIDURAL

## 2018-11-29 MED ORDER — FENTANYL CITRATE (PF) 100 MCG/2ML IJ SOLN
25.0000 ug | INTRAMUSCULAR | Status: DC | PRN
  Administered 2018-11-29 (×5): 25 ug via INTRAVENOUS
  Filled 2018-11-29: qty 2

## 2018-11-29 MED ORDER — WITCH HAZEL-GLYCERIN EX PADS: 1.0000 "application " | MEDICATED_PAD | CUTANEOUS | Status: DC | PRN

## 2018-11-29 MED ORDER — MIDAZOLAM HCL 2 MG/2ML IJ SOLN
INTRAMUSCULAR | Status: AC
  Filled 2018-11-29: qty 2

## 2018-11-29 MED ORDER — MEPERIDINE HCL 25 MG/ML IJ SOLN: 6.2500 mg | INTRAMUSCULAR | Status: DC | PRN

## 2018-11-29 MED ORDER — FENTANYL CITRATE (PF) 100 MCG/2ML IJ SOLN
INTRAMUSCULAR | Status: DC | PRN
  Administered 2018-11-29: 50 ug via INTRAVENOUS

## 2018-11-29 MED ORDER — TERBUTALINE SULFATE 1 MG/ML IJ SOLN
INTRAMUSCULAR | Status: AC
  Filled 2018-11-29: qty 1

## 2018-11-29 MED ORDER — SODIUM CHLORIDE 0.9 % IV SOLN
INTRAVENOUS | Status: DC
  Administered 2018-11-29: 04:00:00 via INTRAVENOUS

## 2018-11-29 MED ORDER — KETAMINE HCL 50 MG/ML IJ SOLN
INTRAMUSCULAR | Status: DC | PRN
  Administered 2018-11-29 (×2): 12.5 mg via INTRAMUSCULAR

## 2018-11-29 MED ORDER — CEFAZOLIN SODIUM-DEXTROSE 2-4 GM/100ML-% IV SOLN
2.0000 g | INTRAVENOUS | Status: AC
  Administered 2018-11-29: 04:00:00 2 g via INTRAVENOUS
  Filled 2018-11-29: qty 100

## 2018-11-29 MED ORDER — NALBUPHINE HCL 10 MG/ML IJ SOLN: 5.0000 mg | Freq: Once | INTRAMUSCULAR | Status: DC | PRN

## 2018-11-29 MED ORDER — KETOROLAC TROMETHAMINE 30 MG/ML IJ SOLN
INTRAMUSCULAR | Status: DC | PRN
  Administered 2018-11-29: 30 mg via INTRAVENOUS

## 2018-11-29 MED ORDER — KETOROLAC TROMETHAMINE 30 MG/ML IJ SOLN
15.0000 mg | Freq: Four times a day (QID) | INTRAMUSCULAR | Status: AC
  Administered 2018-11-29: 15 mg via INTRAVENOUS
  Filled 2018-11-29 (×2): qty 1

## 2018-11-29 MED ORDER — LACTATED RINGERS IV SOLN
INTRAVENOUS | Status: DC
  Administered 2018-11-29: 02:00:00 via INTRAVENOUS

## 2018-11-29 MED ORDER — OXYCODONE-ACETAMINOPHEN 5-325 MG PO TABS
1.0000 | ORAL_TABLET | ORAL | Status: DC | PRN
  Administered 2018-11-29: 10:00:00 1 via ORAL

## 2018-11-29 MED ORDER — FENTANYL CITRATE (PF) 100 MCG/2ML IJ SOLN: 25.0000 ug | INTRAMUSCULAR | Status: DC | PRN

## 2018-11-29 MED ORDER — SOD CITRATE-CITRIC ACID 500-334 MG/5ML PO SOLN
30.0000 mL | ORAL | Status: AC
  Administered 2018-11-29: 30 mL via ORAL
  Filled 2018-11-29: qty 30

## 2018-11-29 MED ORDER — ONDANSETRON HCL 4 MG/2ML IJ SOLN: 4.0000 mg | Freq: Once | INTRAMUSCULAR | Status: DC | PRN

## 2018-11-29 MED ORDER — KETOROLAC TROMETHAMINE 30 MG/ML IJ SOLN
30.0000 mg | Freq: Four times a day (QID) | INTRAMUSCULAR | Status: AC | PRN
  Administered 2018-11-29 (×2): 30 mg via INTRAVENOUS
  Filled 2018-11-29: qty 1

## 2018-11-29 MED ORDER — TERBUTALINE SULFATE 1 MG/ML IJ SOLN
0.2500 mg | Freq: Once | INTRAMUSCULAR | Status: AC
  Administered 2018-11-29: 02:00:00 0.25 mg via SUBCUTANEOUS

## 2018-11-29 MED ORDER — NALOXONE HCL 4 MG/10ML IJ SOLN
1.0000 ug/kg/h | INTRAVENOUS | Status: DC | PRN
  Filled 2018-11-29: qty 5

## 2018-11-29 MED ORDER — SODIUM CHLORIDE 0.9 % IV SOLN
500.0000 mg | INTRAVENOUS | Status: DC
  Administered 2018-11-29: 500 mg via INTRAVENOUS
  Filled 2018-11-29 (×2): qty 500

## 2018-11-29 MED ORDER — DIPHENHYDRAMINE HCL 25 MG PO CAPS: 25.0000 mg | ORAL_CAPSULE | ORAL | Status: DC | PRN

## 2018-11-29 MED ORDER — MENTHOL 3 MG MT LOZG
1.0000 | LOZENGE | OROMUCOSAL | Status: DC | PRN
  Filled 2018-11-29: qty 9

## 2018-11-29 MED ORDER — SODIUM CHLORIDE 0.9% FLUSH: 3.0000 mL | INTRAVENOUS | Status: DC | PRN

## 2018-11-29 SURGICAL SUPPLY — 33 items
CANISTER SUCT 3000ML PPV (MISCELLANEOUS) ×2 IMPLANT
CATH KIT ON-Q SILVERSOAK 5 (CATHETERS) ×2 IMPLANT
CATH KIT ON-Q SILVERSOAK 5IN (CATHETERS) ×4 IMPLANT
COVER WAND RF STERILE (DRAPES) ×2 IMPLANT
DERMABOND ADVANCED (GAUZE/BANDAGES/DRESSINGS) ×1
DERMABOND ADVANCED .7 DNX12 (GAUZE/BANDAGES/DRESSINGS) ×1 IMPLANT
DRSG OPSITE POSTOP 4X10 (GAUZE/BANDAGES/DRESSINGS) ×2 IMPLANT
DRSG TELFA 3X8 NADH (GAUZE/BANDAGES/DRESSINGS) IMPLANT
ELECT CAUTERY BLADE 6.4 (BLADE) ×2 IMPLANT
ELECT REM PT RETURN 9FT ADLT (ELECTROSURGICAL) ×2
ELECTRODE REM PT RTRN 9FT ADLT (ELECTROSURGICAL) ×1 IMPLANT
GAUZE SPONGE 4X4 12PLY STRL (GAUZE/BANDAGES/DRESSINGS) ×1 IMPLANT
GLOVE BIO SURGEON STRL SZ7 (GLOVE) ×4 IMPLANT
GLOVE INDICATOR 7.5 STRL GRN (GLOVE) ×4 IMPLANT
GOWN STRL REUS W/ TWL LRG LVL3 (GOWN DISPOSABLE) ×3 IMPLANT
GOWN STRL REUS W/TWL LRG LVL3 (GOWN DISPOSABLE) ×3
NS IRRIG 1000ML POUR BTL (IV SOLUTION) ×2 IMPLANT
PACK C SECTION AR (MISCELLANEOUS) ×2 IMPLANT
PAD DRESSING TELFA 3X8 NADH (GAUZE/BANDAGES/DRESSINGS) ×1 IMPLANT
PAD OB MATERNITY 4.3X12.25 (PERSONAL CARE ITEMS) ×3 IMPLANT
PAD PREP 24X41 OB/GYN DISP (PERSONAL CARE ITEMS) ×2 IMPLANT
PENCIL SMOKE ULTRAEVAC 22 CON (MISCELLANEOUS) ×1 IMPLANT
STRIP CLOSURE SKIN 1/2X4 (GAUZE/BANDAGES/DRESSINGS) ×2 IMPLANT
SUT MNCRL 4-0 (SUTURE) ×1
SUT MNCRL 4-0 27XMFL (SUTURE) ×1
SUT PDS AB 1 TP1 96 (SUTURE) ×2 IMPLANT
SUT PLAIN GUT 0 (SUTURE) IMPLANT
SUT VIC AB 0 CTX 36 (SUTURE) ×3
SUT VIC AB 0 CTX36XBRD ANBCTRL (SUTURE) ×2 IMPLANT
SUT VIC AB 3-0 SH 27 (SUTURE) ×1
SUT VIC AB 3-0 SH 27X BRD (SUTURE) IMPLANT
SUTURE MNCRL 4-0 27XMF (SUTURE) ×1 IMPLANT
SWABSTK COMLB BENZOIN TINCTURE (MISCELLANEOUS) ×1 IMPLANT

## 2018-11-29 NOTE — H&P (Addendum)
OB History & Physical   History of Present Illness:  Chief Complaint:  My bag of water broke at midnight and my contractions started 45 minutes later.  HPI:  Annette Roberts is a 33 y.o. G9P1102 female with EDC=12/14/2018 at [redacted]w[redacted]d dated by 7 week ultrasound.  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  She presents to L&D for evaluation of PROM and labor. The amniotic fluid was "yellow at first then became clear." Her contractions were reported to be every 10 minutes apart. No vaginal bleeding. She uses 95mg  Methadone liquid daily in the AM.   Prenatal care site: Prenatal care at [facility] has also been remarkable for a 30 pound weight gain as well as the following:   [facility] Prenatal Labs  Dating [place] Blood type: O/Positive/-- (03/03 1529)   Genetic Screen   NIPS:nml XY Antibody:Positive, antiE, too low to titer  Anatomic [place] Normal anatomy scan Rubella: 1.69 (03/03 1529) Varicella:    GTT Third trimester:  RPR: Non Reactive (03/03 1529)   Rhogam n/a HBsAg: Negative (03/03 1529)   TDaP vaccine      10/14/18         Flu Shot:2020 HIV: Non Reactive (03/03 1529)   Baby Food       Bottle                         GBS: pending  Contraception Nexplanon Pap:04/2018 NIL  CBB  No   CS/VBAC CS  desired   Support Person Husband           Maternal Medical History:   Past Medical History:  Diagnosis Date  . Current smoker   . Depression    does not want anyone incl  FOB to know  . History of preterm premature rupture of membranes (PPROM)    G2  . 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 Marijuana use   . 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 Scoliosis     Past Surgical History:  Procedure Laterality Date  .  CESAREAN SECTION  09/10/2009   FTP/FITL, meconium  . CESAREAN SECTION N/A 05/22/2015   Procedure: CESAREAN SECTION;  Surgeon: Will Bonnet, MD;  Location: [facility] ORS;  Service: Obstetrics;  Laterality: N/A;    No Known Allergies  Prior to Admission medications   Medication Sig Start Date End Date Taking? Authorizing Provider  methadone (DOLOPHINE) 10 MG/ML solution Take 95 mg by mouth daily. 11/29/18  Yes [provider]  ondansetron (ZOFRAN) 4 MG tablet Take 1 tablet (4 mg total) by mouth every 8 (eight) hours as needed for nausea or vomiting. 11/04/18  Yes Rod Can, CNM  Prenatal Multivit-Min-Fe-FA (PRENATAL VITAMINS PO) Take 1 tablet by mouth daily.   Yes [provider]  hydroxyprogesterone caproate (MAKENA) 250 mg/mL OIL injection Inject 1 mL (250 mg total) into the muscle once a week for 20 doses. 05/25/18 10/06/18  Rexene Agent, CNM          Social History: She  reports that she has been smoking cigarettes. She has been smoking about 0.50 packs per day. She has never used smokeless tobacco. She reports current drug use. Drug: Marijuana. She reports that she does not drink alcohol.  Family History: family history includes Cancer in her paternal grandmother; Diabetes in her father and nephew; Heart attack in her maternal aunt, maternal grandfather, and maternal uncle; Hypertension in her father.   Review of Systems: Negative x 10 systems reviewed except as noted in the HPI.      Physical Exam:  Vital Signs: BP 131/76   Pulse 90   Temp 97.8 F (36.6 C) (Oral)   Resp 18   Ht 5\' 1"  (1.549 m)   Wt 61.2 kg   LMP 03/01/2018 (Within Days)   SpO2 98%   BMI 25.51 kg/m  General: gravid WF, grimacing with some contractions, wants something for pain HEENT: normocephalic, atraumatic Heart: regular rate & rhythm.  No murmurs/rubs/gallops Lungs:harsh breath sounds/ wheezing bilaterally Abdomen: soft, gravid, tender with contraction;  EFW:  6# Pelvic:  SSE:   External: Normal external female genitalia  Vagina: pooling of off white mucoid discharge with clear watery discharge  Cervix: Dilation: 3.5 / Effacement (%): 70, 80 / Station: -1, 0     ROM: + pooling;  - ferning Extremities: non-tender, symmetric, no edema bilaterally.   Neurologic: Alert & oriented x 3.      Baseline FHR: 125-130 baseline with accelerations to 150s, moderate variability Toco: contractions every 3-4 minutes apart   Bedside Ultrasound:    Presentation: cephalic  Placenta: posterior    Assessment:  Annette Roberts is a 33 y.o. G50P1102 female at [redacted]w[redacted]d with PROM and onset of labor Prior Cesarean section x 2-desires repeat Methadone maintenance during pregnancy  Initial elevated blood pressure Anti E earlier this pregnancy  Plan:  1. Admit to Labor & Delivery - notified Dr [redacted]w[redacted]d of admission and repeat  Cesarean section  2. CBC, T&S, Clrs, IVF, UDS, SARS testing, CMP 3. GBS done.   4. Consents obtained for repeat Cesarean section 5. NPO 6. Terbutaline to space out contractions while awaiting lab results   [name]  11/29/2018 3:56 AM

## 2018-11-29 NOTE — Discharge Summary (Signed)
OB Discharge Summary     Patient Name: Annette Roberts DOB: February 16, 1986 MRN: [id number]  Date of admission: 11/29/2018 Delivering MD: Thomasene Mohair, MD Date of Delivery: 11/29/2018  Date of discharge: 12/01/2018  Admitting diagnosis: Spontaneous rupture of membranes, normal labor, prior Cesarean section Intrauterine pregnancy: [redacted]w[redacted]d     Secondary diagnosis: Anemia, Long term treatment with Methadone     Discharge diagnosis: Term Pregnancy Delivered, repeat low transverse Cesarean section                                                                                              Post partum procedures: none  Augmentation: n/a  Complications: None  Hospital course:  Onset of Labor With C/S  33 y.o. yo [id number] at [redacted]w[redacted]d was admitted in Active Labor on 11/29/2018. Patient had a labor course significant for presentation with verified rupture of membranes and labor. Membrane Rupture Time/Date: 12:00 AM ,11/29/2018   The patient went for cesarean section due to Elective Repeat, and delivered a Viable female infant,11/29/2018, baby weighing 2600 gm. Details of operation can be found in separate operative note. Patient had an postpartum course complicated by labile blood pressures.  She is ambulating,tolerating a regular diet, passing flatus, and urinating well.  Patient is discharged home in stable condition 12/01/18.  Physical exam  Vitals:   12/01/18 1429 12/01/18 1547 12/01/18 1946 12/01/18 2136  BP: (!) 141/87 139/81 140/89 (!) 146/89  Pulse:  64 63 (!) 53  Resp:  20 20   Temp:  98.1 F (36.7 C) 98.9 F (37.2 C)   TempSrc:   Oral   SpO2:  98% 100%   Weight:      Height:      BP at 1315 was 130/103 General: alert, cooperative and no distress  Heart: RRR without murmur Lungs: CTA/ normal respiratory effort Lochia: small amount Uterine Fundus: firm Incision: Dressing is clean, dry, and intact; OQ dressing changed DVT Evaluation: No evidence of DVT seen on physical exam.  Labs: Lab  Results  Component Value Date   WBC 9.7 12/01/2018   HGB 11.2 (L) 12/01/2018   HCT 32.9 (L) 12/01/2018   MCV 89.2 12/01/2018   PLT 194 12/01/2018    Discharge instruction: per After Visit Summary.  Medications:  Allergies as of 12/01/2018   No Known Allergies     Medication List    STOP taking these medications   hydroxyprogesterone caproate 250 mg/mL Oil injection Commonly known as: Makena   ondansetron 4 MG tablet Commonly known as: Zofran   PRENATAL VITAMINS PO     TAKE these medications   acetaminophen 500 MG tablet Commonly known as: TYLENOL Take 2 tablets (1,000 mg total) by mouth every 6 (six) hours as needed.   albuterol 108 (90 Base) MCG/ACT inhaler Commonly known as: VENTOLIN HFA Inhale 1-2 puffs into the lungs every 6 (six) hours as needed for wheezing or shortness of breath.   ibuprofen 600 MG tablet Commonly known as: ADVIL Take 1 tablet (600 mg total) by mouth every 6 (six) hours as needed for mild pain, moderate pain or cramping.   methadone 10  MG/ML solution Commonly known as: DOLOPHINE Take 95 mg by mouth daily.   oxyCODONE 5 MG immediate release tablet Commonly known as: Roxicodone Take 1-2 tablets every 6 hours prn moderate to severe pain.   Prenatal Adult Gummy/DHA/FA 0.4-25 MG Chew Chew 1 Piece by mouth daily.       Diet: routine diet  Activity: Advance as tolerated. Pelvic rest for 6 weeks.   Outpatient follow up: Follow-up Information    Will Bonnet, MD. Go on 12/05/2018.   Specialty: Obstetrics and Gynecology Why: Post-op incision check/ blood pressure check and possible Nexplanon at 0830 12/05/2018 Contact information: [address] [place] [address] [phone]             Postpartum contraception: Nexplanon Rhogam Given postpartum: no Rubella vaccine given postpartum: no Varicella vaccine given postpartum: no TDaP given antepartum or postpartum: 10/14/2018 Influenza Vaccine: 04/27/2018  Newborn  Data: Live born female [place] Birth Weight: 5 lb 11.7 oz (2600 g) APGAR: 8, 8  Newborn Delivery   Birth date/time: 11/29/2018 04:44:00 Delivery type: C-Section, Low Transverse Trial of labor: No C-section categorization: Repeat       Baby Feeding: formula  Disposition:rooming in for ESC  SIGNED: Prentice Docker, MD

## 2018-11-29 NOTE — Progress Notes (Signed)
Pt. returned to unit. IV flushed and hooked back to pitocin at 62.5 ml./hr. Pulse ox applied. Pt. Verbalized a urine occurrence with no difficulty.  Pt. stable and resting in bed.

## 2018-11-29 NOTE — Clinical Social Work Maternal (Signed)
  CLINICAL SOCIAL WORK MATERNAL/CHILD NOTE  Patient Details  Name: Annette Roberts MRN: [id number] Date of Birth: 08/09/1985  Date:  11/29/2018  Clinical Social Worker Initiating Note:  Annamaria Boots LCSWA Date/Time: Initiated:  11/29/18/1415     Child's Name:  Annette Roberts   Biological Parents:  Mother, Father   Need for Interpreter:  None   Reason for Referral:  Current Substance Use/Substance Use During Pregnancy    Address:  [address] [place] [address]    Phone number:  [phone] (home)     Additional phone number:   Household Members/Support Persons (HM/SP):   Household Member/Support Person 1   HM/SP Name Relationship DOB or Age  HM/SP -[place] Stepfather    HM/SP -2        HM/SP -3        HM/SP -4        HM/SP -5        HM/SP -6        HM/SP -7        HM/SP -8          Natural Supports (not living in the home):  Extended Family   Professional Supports: Other (Comment)(Methadone [place])   Employment: Unemployed   Type of Work:     Education:  Programmer, systems   Homebound arranged:    Museum/gallery curator Resources:  Medicaid   Other Resources:  [facility]   Cultural/Religious Considerations Which May Impact Care:    Strengths:  Ability to meet basic needs , Compliance with medical plan , Home prepared for child , Understanding of illness, Pediatrician chosen   Psychotropic Medications:         Pediatrician:    [employer]  Pediatrician List:   [place]      Pediatrician Fax Number:    Risk Factors/Current Problems:  Substance Use    Cognitive State:  Able to Concentrate , Alert    Mood/Affect:  Relaxed , Interested    CSW Assessment: CSW consulted for current drug use during pregnancy. CSW found through chart review that patient was positive for benzodiazepines, methodone and cannabis.  Patient's baby was also positive for Methadone on UDS, cord testing is still pending. CSW met with patient to discuss concerns. Patient reports that she lives with her significant other Annette Roberts and her 43 year old son. Patient states that she does not work and plans to stay home with baby. Patient reports that she was using opiates in the past and started going to methadone clinic when she got pregnant. Patient reports that she does not use any other substances other than Methadone. Patient reports that she goes to [facility] in [place] for her methadone. Patient states that she goes to the clinic daily and plans to continue once she gets home. Patient reports that she has all needed items for baby at home. Patient reports that she has had experience with CPS in the past but does have custody of both her other children ( 31 year old daughter and 72 year old son). CSW completed CPS report for positive drug screen.   CSW Plan/Description:  CSW Will Continue to Monitor Umbilical Cord Tissue Drug Screen Results and Make Report if [place], Child Protective Service Report     Annette Roberts 11/29/2018, 3:15 PM

## 2018-11-29 NOTE — Anesthesia Post-op Follow-up Note (Signed)
Anesthesia QCDR form completed.        

## 2018-11-29 NOTE — Progress Notes (Signed)
Notified Rosey Bath that pts pain continues to be 7-8/10 after receiving a total of 152mcg of fentanyl. Pt can not get toradol again till 1130-1200am. Given verbal order from Rosey Bath to give 5mg  of oxycodone. Will continue to assess patients pain.

## 2018-11-29 NOTE — OB Triage Note (Signed)
Pt 33 yo, G3P2, [redacted]w[redacted]d, presents to unit w/ c/o ctxs every 10 minutes that began about an hour ag. Reports she was leaking a mucousy, yellow discharge earlier this evening, states it is now clearer and thin. Rates ctxs at an 8/10, denies vaginal bleeding, decreased fetal movement. Hx of 2 C/S and preterm delivery at 32 weeks. Monitors applied and assessing. Vitals stable.

## 2018-11-29 NOTE — Progress Notes (Signed)
RN spoke with Dr. Marcello Moores to confirm PACU orders that were discontinued during transfer from OR to [facility]. Verbal order given for Fentanyl IV 25 mcg every 5 min up to 150 mcg.

## 2018-11-29 NOTE — Anesthesia Preprocedure Evaluation (Signed)
Anesthesia Evaluation  Patient identified by MRN, date of birth, ID band Patient awake    Reviewed: Allergy & Precautions, NPO status , Patient's Chart, lab work & pertinent test results, reviewed documented beta blocker date and time   Airway Mallampati: II  TM Distance: >3 FB     Dental  (+) Chipped   Pulmonary Current Smoker,           Cardiovascular      Neuro/Psych Depression    GI/Hepatic   Endo/Other    Renal/GU      Musculoskeletal   Abdominal   Peds  Hematology   Anesthesia Other Findings Smokes. 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  Reproductive/Obstetrics                             Anesthesia Physical Anesthesia Plan  ASA: III  Anesthesia Plan: Spinal   Post-op Pain Management:    Induction: Intravenous  PONV Risk Score and Plan:   Airway Management Planned:   Additional Equipment:   Intra-op Plan:   Post-operative Plan:   Informed Consent: I have reviewed the patients History and Physical, chart, labs and discussed the procedure including the risks, benefits and alternatives for the proposed anesthesia with the patient or authorized representative who has indicated his/her understanding and acceptance.       Plan Discussed with: CRNA  Anesthesia Plan Comments:         Anesthesia Quick Evaluation

## 2018-11-29 NOTE — Progress Notes (Signed)
Pt. Requested to leave unit to walk outside to smoke a cigarette. Pt. educated on need to continue Pitocin infusion and continuous pulse ox monitoring. Pt. verbalized understanding and expressed intentions to leave floor for a few minutes. Pt ambulated well. Vital signs stable at time of departure. Pt. ambulated downstairs AMA.

## 2018-11-29 NOTE — Op Note (Signed)
Cesarean Section Operative Note    Annette Roberts   11/29/2018   Pre-operative Diagnosis:  1) History of cesarean section, desires repeat 2) Spontaneous rupture of membranes 3) Normal Labor 4) intrauterine pregnancy at [redacted]w[redacted]d   Post-operative Diagnosis:  1) History of cesarean section, desires repeat 2) Spontaneous rupture of membranes 3) Normal Labor 4) intrauterine pregnancy at [redacted]w[redacted]d    Procedure: Repeat Low Transverse Cesarean Section via Pfannenstiel incision with double layer uterine closure  Surgeon: Surgeon(s) and Role:    Conard Novak, MD - Primary   Assistants: Farrel Conners, CNM; No other capable assistant available, in surgery requiring high level assistant.  Anesthesia: spinal   Findings:  1) normal appearing gravid uterus, fallopian tubes, and ovaries 2) viable female infant with APGARs 8 and 8, weight 2,600 grams   Estimated Blood Loss: 500 mL  Total IV Fluids: 1,000 ml crystalloid  Urine Output: 50 mL clear urine at end of case  Specimens: none  Complications: no complications  Disposition: PACU - hemodynamically stable.   Maternal Condition: stable   Baby condition / location:  Nursery due to respiratory issues  Procedure Details:  The patient was seen in the Holding Room. The risks, benefits, complications, treatment options, and expected outcomes were discussed with the patient. The patient concurred with the proposed plan, giving informed consent. identified as Annette Roberts and the procedure verified as C-Section Delivery. A Time Out was held and the above information confirmed.   After induction of anesthesia, the patient was draped and prepped in the usual sterile manner. A Pfannenstiel incision was made and carried down through the subcutaneous tissue to the fascia. Fascial incision was made and extended transversely. The fascia was separated from the underlying rectus tissue superiorly and inferiorly. The peritoneum was identified and  entered. Peritoneal incision was extended longitudinally. The bladder flap was bluntly and sharply freed from the lower uterine segment. A low transverse uterine incision was made and the hysterotomy was extended with cranial-caudal tension. Delivered from cephalic presentation was a 2,600 gram Living newborn infant(s) or Female with Apgar scores of 8 at one minute and 8 at five minutes. Cord ph was not sent the umbilical cord was clamped and cut cord blood was obtained for evaluation. The placenta was removed Intact and appeared normal. The uterine outline, tubes and ovaries appeared normal. The uterine incision was closed with running locked sutures of 0 Vicryl.  A second layer of the same suture was thrown in an imbricating fashion.  Hemostasis was assured.  The uterus was returned to the abdomen and the paracolic gutters were cleared of all clots and debris.  The rectus muscles were inspected and found to be hemostatic. The peritoneum was re-approximated using 0 vicryl in a running fashion.   The On-Q catheter pumps were inserted in accordance with the manufacturer's recommendations.  The catheters were inserted approximately 4cm cephelad to the incision line, approximately 1cm apart, straddling the midline.  They were inserted to a depth of the 4th mark. They were positioned superficial to the rectus abdominus muscles and deep to the rectus fascia.    The fascia was then reapproximated with running sutures of 1-0 PDS, looped. The subcutaneous tissue was closed with a running 3-0 Vicryl stitch to reduce tension on the skin closure.  The subcuticular closure was performed using 4-0 monocryl. The skin closure was reinforced using surgical skin glue.  The On-Q catheters were bolused with 5 mL of 0.5% marcaine plain for a total  of 10 mL.  The catheters were affixed to the skin with surgical skin glue, steri-strips, and tegaderm.    Instrument, sponge, and needle counts were correct prior the abdominal closure  and were correct at the conclusion of the case.  The patient received Ancef 2 gram IV prior to skin incision (within 30 minutes) and Azithromycin 500 mg just after the start of the surgery. For VTE prophylaxis she was wearing SCDs throughout the case.  The assistant surgeon was an CNM due to lack of availability of another Counselling psychologist.   Signed: Will Bonnet, MD 11/29/2018 5:33 AM

## 2018-11-29 NOTE — Transfer of Care (Signed)
Immediate Anesthesia Transfer of Care Note  Patient: Annette Roberts  Procedure(s) Performed: CESAREAN SECTION (N/A )  Patient Location: PACU  Anesthesia Type:General  Level of Consciousness: awake  Airway & Oxygen Therapy: Patient Spontanous Breathing  Post-op Assessment: Report given to RN and Post -op Vital signs reviewed and stable  Post vital signs: Reviewed and stable  Last Vitals:  Vitals Value Taken Time  BP 127/77 11/29/18 0550  Temp 36.8 C 11/29/18 0550  Pulse 68 11/29/18 0557  Resp 26 11/29/18 0557  SpO2 100 % 11/29/18 0557  Vitals shown include unvalidated device data.  Last Pain:  Vitals:   11/29/18 0550  TempSrc: Oral  PainSc:       Patients Stated Pain Goal: 0 (82/95/62 1308)  Complications: No apparent anesthesia complications

## 2018-11-29 NOTE — H&P (Signed)
Patient seen and examined by me.    SROM at home at about 11PM, clear fluid.  Denies vaginal bleeding. SHe notes +FM. She also notes  Contractions.  History of c-section x 2.  Desires repeat.   Personally consented patient for repeat c-section. Will proceed as soon as OR ready.  BP 131/76   Pulse 90   Temp 97.8 F (36.6 C) (Oral)   Resp 18   Ht 5\' 1"  (1.549 m)   Wt 61.2 kg   LMP 03/01/2018 (Within Days)   SpO2 98%   BMI 25.51 kg/m   Abdomen - gravid, NT  Prentice Docker, MD, [place], [facility] 11/29/2018 2:58 AM

## 2018-11-29 NOTE — Progress Notes (Signed)
[place], [place], telephone: [phone] to confirm Methadone dose.   RN at treatment center confirmed Methadone 95 mg and last dose was given yesterday. They also confirmed that they only prescribe Methadone- not Benzodiazepines.   Antibody titer is still pending.  Rod Can, CNM

## 2018-11-29 NOTE — Progress Notes (Signed)
Pt. Requested to leave unit to "walk downstairs to smoke a cigarette". Pt. educated on need to continue Pitocin infusion and continuous pulse ox monitoring. Pt. verbalized understanding and expressed intentions to leave floor for a few minutes. Pt ambulated well. Vital signs stable at time of departure. Pt. ambulated downstairs AMA.   Stark Klein, RN

## 2018-11-30 LAB — CBC
HCT: 31.4 % — ABNORMAL LOW (ref 36.0–46.0)
Hemoglobin: 10.6 g/dL — ABNORMAL LOW (ref 12.0–15.0)
MCH: 30.2 pg (ref 26.0–34.0)
MCHC: 33.8 g/dL (ref 30.0–36.0)
MCV: 89.5 fL (ref 80.0–100.0)
Platelets: 139 10*3/uL — ABNORMAL LOW (ref 150–400)
RBC: 3.51 MIL/uL — ABNORMAL LOW (ref 3.87–5.11)
RDW: 13.2 % (ref 11.5–15.5)
WBC: 8.5 10*3/uL (ref 4.0–10.5)
nRBC: 0 % (ref 0.0–0.2)

## 2018-11-30 MED ORDER — ALBUTEROL SULFATE HFA 108 (90 BASE) MCG/ACT IN AERS
1.0000 | INHALATION_SPRAY | Freq: Four times a day (QID) | RESPIRATORY_TRACT | Status: DC | PRN
  Administered 2018-11-30: 2 via RESPIRATORY_TRACT
  Filled 2018-11-30: qty 6.7

## 2018-11-30 NOTE — Anesthesia Postprocedure Evaluation (Signed)
Anesthesia Post Note  Patient: Annette Roberts  Procedure(s) Performed: CESAREAN SECTION (N/A )  Patient location during evaluation: Mother Baby Anesthesia Type: Spinal Level of consciousness: oriented and awake and alert Pain management: pain level controlled Vital Signs Assessment: post-procedure vital signs reviewed and stable Respiratory status: spontaneous breathing and respiratory function stable Cardiovascular status: blood pressure returned to baseline and stable Postop Assessment: no headache, no backache, no apparent nausea or vomiting and able to ambulate Anesthetic complications: no     Last Vitals:  Vitals:   11/30/18 0437 11/30/18 0804  BP: (!) 143/83 127/77  Pulse: (!) 58 (!) 52  Resp: 18 18  Temp: 36.8 C 36.9 C  SpO2: 100% 99%    Last Pain:  Vitals:   11/30/18 0804  TempSrc: Oral  PainSc:                  Jerrye Noble

## 2018-11-30 NOTE — Progress Notes (Signed)
POD#1 rLTCS Subjective:  Fatigued, pain is now under control. Ambulating and voiding without difficulty. Tolerating a regular diet.  Objective:  Blood pressure 127/77, pulse (!) 52, temperature 98.5 F (36.9 C), temperature source Oral, resp. rate 18, height 5\' 1"  (1.549 m), weight 61.2 kg, last menstrual period 03/01/2018, SpO2 99 %, unknown if currently breastfeeding.  General: NAD Pulmonary: no increased work of breathing Abdomen: non-distended, non-tender, fundus firm at level of umbilicus, lochia appropriate Incision: C/D/I, on-Q in place Extremities: no edema, no erythema, no tenderness  Results for orders placed or performed during the hospital encounter of 11/29/18 (from the past 72 hour(s))  Type and screen [place]     Status: None (Preliminary result)   Collection Time: 11/29/18  1:58 AM  Result Value Ref Range   ABO/RH(D) O POS    Antibody Screen POS    Sample Expiration 12/02/2018,2359    Antibody Identification ANTI E    PT AG Type POSITIVE FOR c ANTIGEN    Antibody Titer (2)      ANTI E <1 Performed at [facility], [address], [place], [place] [address]    Unit Number [id number]    Blood Component Type RED CELLS,LR    Unit division 00    Status of Unit ALLOCATED    Transfusion Status OK TO TRANSFUSE    Crossmatch Result COMPATIBLE    Unit Number [id number]    Blood Component Type RED CELLS,LR    Unit division 00    Status of Unit ALLOCATED    Transfusion Status OK TO TRANSFUSE    Crossmatch Result COMPATIBLE   CBC with Differential     Status: Abnormal   Collection Time: 11/29/18  1:58 AM  Result Value Ref Range   WBC 11.1 (H) 4.0 - 10.5 K/uL   RBC 3.85 (L) 3.87 - 5.11 MIL/uL   Hemoglobin 11.7 (L) 12.0 - 15.0 g/dL   HCT 33.7 (L) 36.0 - 46.0 %   MCV 87.5 80.0 - 100.0 fL   MCH 30.4 26.0 - 34.0 pg   MCHC 34.7 30.0 - 36.0 g/dL   RDW 12.7 11.5 - 15.5 %   Platelets 151 150 - 400 K/uL   nRBC 0.0 0.0 - 0.2 %   Neutrophils Relative % 69 %   Neutro Abs 7.6 1.7 - 7.7 K/uL   Lymphocytes Relative 24 %   Lymphs Abs 2.7 0.7 - 4.0 K/uL   Monocytes Relative 5 %   Monocytes Absolute 0.6 0.1 - 1.0 K/uL   Eosinophils Relative 1 %   Eosinophils Absolute 0.1 0.0 - 0.5 K/uL   Basophils Relative 0 %   Basophils Absolute 0.0 0.0 - 0.1 K/uL   Immature Granulocytes 1 %   Abs Immature Granulocytes 0.07 0.00 - 0.07 K/uL    Comment: Performed at [facility], [address] [place], [place] [address]  Comprehensive metabolic panel     Status: Abnormal   Collection Time: 11/29/18  1:58 AM  Result Value Ref Range   Sodium 127 (L) 135 - 145 mmol/L   Potassium 3.6 3.5 - 5.1 mmol/L   Chloride 99 98 - 111 mmol/L   CO2 22 22 - 32 mmol/L   Glucose, Bld 78 70 - 99 mg/dL   BUN 6 6 - 20 mg/dL   Creatinine, Ser 0.59 0.44 - 1.00 mg/dL   Calcium 8.2 (L) 8.9 - 10.3 mg/dL   Total Protein 6.1 (L) 6.5 - 8.1 g/dL   Albumin 2.7 (  L) 3.5 - 5.0 g/dL   AST 29 15 - 41 U/L   ALT 18 0 - 44 U/L   Alkaline Phosphatase 208 (H) 38 - 126 U/L   Total Bilirubin 0.5 0.3 - 1.2 mg/dL   GFR calc non Af Amer >60 >60 mL/min   GFR calc Af Amer >60 >60 mL/min   Anion gap 6 5 - 15    Comment: Performed at [facility], [address] [place], [place] [address]  RPR     Status: None   Collection Time: 11/29/18  1:58 AM  Result Value Ref Range   RPR Ser Ql NON REACTIVE NON REACTIVE    Comment: Performed at [facility], [address], [place], [place] [address]  Urine Drug Screen, Qualitative ([facility] only)     Status: Abnormal   Collection Time: 11/29/18  2:00 AM  Result Value Ref Range   Tricyclic, Ur Screen NONE DETECTED NONE DETECTED   Amphetamines, Ur Screen NONE DETECTED NONE DETECTED   MDMA (Ecstasy)Ur Screen NONE DETECTED NONE DETECTED   Cocaine Metabolite,Ur [place] NONE DETECTED NONE DETECTED   Opiate, Ur Screen NONE DETECTED NONE DETECTED   Phencyclidine (PCP) Ur S NONE DETECTED NONE DETECTED   Cannabinoid 50  Ng, Ur [place] POSITIVE (A) NONE DETECTED   Barbiturates, Ur Screen NONE DETECTED NONE DETECTED   Benzodiazepine, Ur Scrn POSITIVE (A) NONE DETECTED   Methadone Scn, Ur POSITIVE (A) NONE DETECTED    Comment: (NOTE) Tricyclics + metabolites, urine    Cutoff 1000 ng/mL Amphetamines + metabolites, urine  Cutoff 1000 ng/mL MDMA (Ecstasy), urine              Cutoff 500 ng/mL Cocaine Metabolite, urine          Cutoff 300 ng/mL Opiate + metabolites, urine        Cutoff 300 ng/mL Phencyclidine (PCP), urine         Cutoff 25 ng/mL Cannabinoid, urine                 Cutoff 50 ng/mL Barbiturates + metabolites, urine  Cutoff 200 ng/mL Benzodiazepine, urine              Cutoff 200 ng/mL Methadone, urine                   Cutoff 300 ng/mL The urine drug screen provides only a preliminary, unconfirmed analytical test result and should not be used for non-medical purposes. Clinical consideration and professional judgment should be applied to any positive drug screen result due to possible interfering substances. A more specific alternate chemical method must be used in order to obtain a confirmed analytical result. Gas chromatography / mass spectrometry (GC/MS) is the preferred confirmat ory method. Performed at [facility], [address] [place], [place] [address]   SARS Coronavirus 2 [facility] order, Performed in [facility] hospital lab) Nasopharyngeal Nasopharyngeal Swab     Status: None   Collection Time: 11/29/18  2:00 AM   Specimen: Nasopharyngeal Swab  Result Value Ref Range   SARS Coronavirus 2 NEGATIVE NEGATIVE    Comment: (NOTE) If result is NEGATIVE SARS-CoV-2 target nucleic acids are NOT DETECTED. The SARS-CoV-2 RNA is generally detectable in upper and lower  respiratory specimens during the acute phase of infection. The lowest  concentration of SARS-CoV-2 viral copies this assay can detect is 250  copies / mL. A negative result does not preclude SARS-CoV-2 infection  and  should not be used  as the sole basis for treatment or other  patient management decisions.  A negative result may occur with  improper specimen collection / handling, submission of specimen other  than nasopharyngeal swab, presence of viral mutation(s) within the  areas targeted by this assay, and inadequate number of viral copies  (<250 copies / mL). A negative result must be combined with clinical  observations, patient history, and epidemiological information. If result is POSITIVE SARS-CoV-2 target nucleic acids are DETECTED. The SARS-CoV-2 RNA is generally detectable in upper and lower  respiratory specimens dur ing the acute phase of infection.  Positive  results are indicative of active infection with SARS-CoV-2.  Clinical  correlation with patient history and other diagnostic information is  necessary to determine patient infection status.  Positive results do  not rule out bacterial infection or co-infection with other viruses. If result is PRESUMPTIVE POSTIVE SARS-CoV-2 nucleic acids MAY BE PRESENT.   A presumptive positive result was obtained on the submitted specimen  and confirmed on repeat testing.  While 2019 novel coronavirus  (SARS-CoV-2) nucleic acids may be present in the submitted sample  additional confirmatory testing may be necessary for epidemiological  and / or clinical management purposes  to differentiate between  SARS-CoV-2 and other Sarbecovirus currently known to infect humans.  If clinically indicated additional testing with an alternate test  methodology [phone]) is advised. The SARS-CoV-2 RNA is generally  detectable in upper and lower respiratory sp ecimens during the acute  phase of infection. The expected result is Negative. Fact Sheet for Patients:  BoilerBrush.com.cy Fact Sheet for Healthcare Providers: https://pope.com/ This test is not yet approved or cleared by the [place] FDA and has been  authorized for detection and/or diagnosis of SARS-CoV-2 by FDA under an Emergency Use Authorization (EUA).  This EUA will remain in effect (meaning this test can be used) for the duration of the COVID-19 declaration under Section 564(b)(1) of the Act, 21 U.S.C. section 360bbb-3(b)(1), unless the authorization is terminated or revoked sooner. Performed at [facility], [address] [place], [place] [address]   Group B strep by PCR     Status: None   Collection Time: 11/29/18  2:00 AM   Specimen: Vaginal/Rectal; Genital  Result Value Ref Range   Group B strep by PCR NEGATIVE NEGATIVE    Comment: (NOTE) Intrapartum testing with Xpert GBS assay should be used as an adjunct to other methods available and not used to replace antepartum testing (at 35-[redacted] weeks gestation). Performed at [facility], [address] [place], [place] [address]   CBC     Status: Abnormal   Collection Time: 11/30/18  5:43 AM  Result Value Ref Range   WBC 8.5 4.0 - 10.5 K/uL   RBC 3.51 (L) 3.87 - 5.11 MIL/uL   Hemoglobin 10.6 (L) 12.0 - 15.0 g/dL   HCT 30.8 (L) 65.7 - 84.6 %   MCV 89.5 80.0 - 100.0 fL   MCH 30.2 26.0 - 34.0 pg   MCHC 33.8 30.0 - 36.0 g/dL   RDW 96.2 95.2 - 84.1 %   Platelets 139 (L) 150 - 400 K/uL   nRBC 0.0 0.0 - 0.2 %    Comment: Performed at [facility], [address] [place], [place] [address]     Assessment:   33 y.o. [phone] postoperative day #1 recovering well  Plan:  1) Acute blood loss anemia - hemodynamically stable and asymptomatic - Continue prenatal vitamins with iron  2) Blood Type --/--/  O POS (10/06 0158) / Rubella 1.69 (03/03 1529) / Varicella Immune  3) TDAP status: received antepartum 10/14/2018  4) Formula feeding  5) Disposition: continue postpartum care.  Marcelyn BruinsJacelyn Kaisy Severino, CNM 11/30/2018  9:00 AM

## 2018-12-01 ENCOUNTER — Telehealth: Payer: Self-pay | Admitting: Obstetrics and Gynecology

## 2018-12-01 LAB — BPAM RBC
Blood Product Expiration Date: 202011032359
Blood Product Expiration Date: 202011032359
Unit Type and Rh: 5100
Unit Type and Rh: 5100

## 2018-12-01 LAB — COMPREHENSIVE METABOLIC PANEL
ALT: 19 U/L (ref 0–44)
AST: 33 U/L (ref 15–41)
Albumin: 2.9 g/dL — ABNORMAL LOW (ref 3.5–5.0)
Alkaline Phosphatase: 154 U/L — ABNORMAL HIGH (ref 38–126)
Anion gap: 8 (ref 5–15)
BUN: 7 mg/dL (ref 6–20)
CO2: 21 mmol/L — ABNORMAL LOW (ref 22–32)
Calcium: 8.5 mg/dL — ABNORMAL LOW (ref 8.9–10.3)
Chloride: 108 mmol/L (ref 98–111)
Creatinine, Ser: 0.64 mg/dL (ref 0.44–1.00)
GFR calc Af Amer: >60 mL/min (ref >60)
GFR calc non Af Amer: >60 mL/min (ref >60)
Glucose, Bld: 87 mg/dL (ref 70–99)
Potassium: 4 mmol/L (ref 3.5–5.1)
Sodium: 137 mmol/L (ref 135–145)
Total Bilirubin: 0.3 mg/dL (ref 0.3–1.2)
Total Protein: 6.5 g/dL (ref 6.5–8.1)

## 2018-12-01 LAB — CBC
HCT: 32.9 % — ABNORMAL LOW (ref 36.0–46.0)
Hemoglobin: 11.2 g/dL — ABNORMAL LOW (ref 12.0–15.0)
MCH: 30.4 pg (ref 26.0–34.0)
MCHC: 34 g/dL (ref 30.0–36.0)
MCV: 89.2 fL (ref 80.0–100.0)
Platelets: 194 10*3/uL (ref 150–400)
RBC: 3.69 MIL/uL — ABNORMAL LOW (ref 3.87–5.11)
RDW: 13.2 % (ref 11.5–15.5)
WBC: 9.7 10*3/uL (ref 4.0–10.5)
nRBC: 0 % (ref 0.0–0.2)

## 2018-12-01 LAB — PROTEIN / CREATININE RATIO, URINE
Creatinine, Urine: 49 mg/dL
Protein Creatinine Ratio: 0.18 mg/mg{Cre} — ABNORMAL HIGH (ref 0.00–0.15)
Total Protein, Urine: 9 mg/dL

## 2018-12-01 LAB — TYPE AND SCREEN
ABO/RH(D): O POS
Antibody Screen: POSITIVE
PT AG Type: POSITIVE
Unit division: 0
Unit division: 0

## 2018-12-01 MED ORDER — IBUPROFEN 600 MG PO TABS: 600.0000 mg | ORAL_TABLET | Freq: Four times a day (QID) | ORAL | 1 refills | Status: DC | PRN

## 2018-12-01 MED ORDER — PRENATAL ADULT GUMMY/DHA/FA 0.4-25 MG PO CHEW: 1.0000 | CHEWABLE_TABLET | Freq: Every day | ORAL | 0 refills | Status: DC

## 2018-12-01 MED ORDER — OXYCODONE HCL 5 MG PO TABS: ORAL_TABLET | ORAL | 0 refills | Status: DC

## 2018-12-01 MED ORDER — ALBUTEROL SULFATE HFA 108 (90 BASE) MCG/ACT IN AERS: 1.0000 | INHALATION_SPRAY | Freq: Four times a day (QID) | RESPIRATORY_TRACT | 0 refills | Status: DC | PRN

## 2018-12-01 MED ORDER — ACETAMINOPHEN 500 MG PO TABS: 1000.0000 mg | ORAL_TABLET | Freq: Four times a day (QID) | ORAL | 2 refills | Status: DC | PRN

## 2018-12-01 NOTE — Progress Notes (Signed)
POD #2 repeat LTCS Subjective:  Tolerating a regular diet and has been passing gas. Had a BM today. Ambulating and caring for baby. ON Q pump is leaking a little.  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 Will be going into NICU. PAtient thinking of discharge tonight so she can get her Methadone for the weekend. Needs to be at [facility] by 10 AM tomorrow morning. Taking 2 Percocet every 4-5 hours as well as 95 mgm Methadone daily.  Objective:  Blood pressure (!) 153/87, pulse 61, temperature 98 F (36.7 C), temperature source Oral, resp. rate 18, height 5\' 1"  (1.549 m), weight 61.2 kg, last menstrual period 03/01/2018, SpO2 99 %, unknown if currently breastfeeding. Patient Vitals for the past 24 hrs:  BP Temp Temp src Pulse Resp SpO2  12/01/18 0836 (!) 153/87 98 F (36.7 C) Oral 61 18 99 %  11/30/18 2314 116/82 97.9 F (36.6 C) Oral 68 18 100 %  11/30/18 1613 132/88 98 F (36.7 C) - 77 20 98 %   General: WF in NAD Heart: RRR without murmur Pulmonary: no increased work of breathing/ CTAB-using incentive spirometer and Albuterol inhaler. Abdomen: non-distended, non-tender, soft Incision: Honeycomb dressing C&D&I, ON Q dressing changed Extremities: no edema, no erythema, no tenderness  Results for orders placed or performed during the hospital encounter of 11/29/18 (from the past 72 hour(s))  Type and screen [place]     Status: None   Collection Time: 11/29/18  1:58 AM  Result Value Ref Range   ABO/RH(D) O POS    Antibody Screen POS    Sample Expiration 12/02/2018,2359    Antibody Identification ANTI E    PT AG Type POSITIVE FOR c ANTIGEN    Antibody Titer (2) ANTI E <1    Unit Number [id number]    Blood Component Type RED CELLS,LR    Unit division 00    Status of Unit REL FROM [facility]    Transfusion Status OK TO TRANSFUSE    Crossmatch Result COMPATIBLE    Unit Number [id number]    Blood  Component Type RED CELLS,LR    Unit division 00    Status of Unit REL FROM [facility]    Transfusion Status OK TO TRANSFUSE    Crossmatch Result COMPATIBLE   CBC with Differential     Status: Abnormal   Collection Time: 11/29/18  1:58 AM  Result Value Ref Range   WBC 11.1 (H) 4.0 - 10.5 K/uL   RBC 3.85 (L) 3.87 - 5.11 MIL/uL   Hemoglobin 11.7 (L) 12.0 - 15.0 g/dL   HCT 33.7 (L) 36.0 - 46.0 %   MCV 87.5 80.0 - 100.0 fL   MCH 30.4 26.0 - 34.0 pg   MCHC 34.7 30.0 - 36.0 g/dL   RDW 12.7 11.5 - 15.5 %   Platelets 151 150 - 400 K/uL   nRBC 0.0 0.0 - 0.2 %   Neutrophils Relative % 69 %   Neutro Abs 7.6 1.7 - 7.7 K/uL   Lymphocytes Relative 24 %   Lymphs Abs 2.7 0.7 - 4.0 K/uL   Monocytes Relative 5 %   Monocytes Absolute 0.6 0.1 - 1.0 K/uL   Eosinophils Relative 1 %   Eosinophils Absolute 0.1 0.0 - 0.5 K/uL   Basophils Relative 0 %   Basophils Absolute 0.0 0.0 - 0.1 K/uL   Immature Granulocytes 1 %   Abs Immature Granulocytes 0.07 0.00 - 0.07 K/uL  Comment: Performed at [facility], [address] [place], [place] [address]  Comprehensive metabolic panel     Status: Abnormal   Collection Time: 11/29/18  1:58 AM  Result Value Ref Range   Sodium 127 (L) 135 - 145 mmol/L   Potassium 3.6 3.5 - 5.1 mmol/L   Chloride 99 98 - 111 mmol/L   CO2 22 22 - 32 mmol/L   Glucose, Bld 78 70 - 99 mg/dL   BUN 6 6 - 20 mg/dL   Creatinine, Ser 0.53 0.44 - 1.00 mg/dL   Calcium 8.2 (L) 8.9 - 10.3 mg/dL   Total Protein 6.1 (L) 6.5 - 8.1 g/dL   Albumin 2.7 (L) 3.5 - 5.0 g/dL   AST 29 15 - 41 U/L   ALT 18 0 - 44 U/L   Alkaline Phosphatase 208 (H) 38 - 126 U/L   Total Bilirubin 0.5 0.3 - 1.2 mg/dL   GFR calc non Af Amer >60 >60 mL/min   GFR calc Af Amer >60 >60 mL/min   Anion gap 6 5 - 15    Comment: Performed at [facility], [address] [place], [place] [address]  RPR     Status: None   Collection Time: 11/29/18  1:58 AM  Result Value Ref Range   RPR Ser Ql NON REACTIVE  NON REACTIVE    Comment: Performed at [facility], [address], [place], [place] [address]  Urine Drug Screen, Qualitative ([facility] only)     Status: Abnormal   Collection Time: 11/29/18  2:00 AM  Result Value Ref Range   Tricyclic, Ur Screen NONE DETECTED NONE DETECTED   Amphetamines, Ur Screen NONE DETECTED NONE DETECTED   MDMA (Ecstasy)Ur Screen NONE DETECTED NONE DETECTED   Cocaine Metabolite,Ur [place] NONE DETECTED NONE DETECTED   Opiate, Ur Screen NONE DETECTED NONE DETECTED   Phencyclidine (PCP) Ur S NONE DETECTED NONE DETECTED   Cannabinoid 50 Ng, Ur [place] POSITIVE (A) NONE DETECTED   Barbiturates, Ur Screen NONE DETECTED NONE DETECTED   Benzodiazepine, Ur Scrn POSITIVE (A) NONE DETECTED   Methadone Scn, Ur POSITIVE (A) NONE DETECTED    Comment: (NOTE) Tricyclics + metabolites, urine    Cutoff 1000 ng/mL Amphetamines + metabolites, urine  Cutoff 1000 ng/mL MDMA (Ecstasy), urine              Cutoff 500 ng/mL Cocaine Metabolite, urine          Cutoff 300 ng/mL Opiate + metabolites, urine        Cutoff 300 ng/mL Phencyclidine (PCP), urine         Cutoff 25 ng/mL Cannabinoid, urine                 Cutoff 50 ng/mL Barbiturates + metabolites, urine  Cutoff 200 ng/mL Benzodiazepine, urine              Cutoff 200 ng/mL Methadone, urine                   Cutoff 300 ng/mL The urine drug screen provides only a preliminary, unconfirmed analytical test result and should not be used for non-medical purposes. Clinical consideration and professional judgment should be applied to any positive drug screen result due to possible interfering substances. A more specific alternate chemical method must be used in order to obtain a confirmed analytical result. Gas chromatography / mass spectrometry (GC/MS) is the preferred confirmat ory method. Performed at [facility], [address]  [place], [place] [address]   SARS Coronavirus 2 [facility](Hospital order, Performed in [facility] hospital  lab) Nasopharyngeal Nasopharyngeal Swab     Status: None   Collection Time: 11/29/18  2:00 AM   Specimen: Nasopharyngeal Swab  Result Value Ref Range   SARS Coronavirus 2 NEGATIVE NEGATIVE    Comment: (NOTE) If result is NEGATIVE SARS-CoV-2 target nucleic acids are NOT DETECTED. The SARS-CoV-2 RNA is generally detectable in upper and lower  respiratory specimens during the acute phase of infection. The lowest  concentration of SARS-CoV-2 viral copies this assay can detect is 250  copies / mL. A negative result does not preclude SARS-CoV-2 infection  and should not be used as the sole basis for treatment or other  patient management decisions.  A negative result may occur with  improper specimen collection / handling, submission of specimen other  than nasopharyngeal swab, presence of viral mutation(s) within the  areas targeted by this assay, and inadequate number of viral copies  (<250 copies / mL). A negative result must be combined with clinical  observations, patient history, and epidemiological information. If result is POSITIVE SARS-CoV-2 target nucleic acids are DETECTED. The SARS-CoV-2 RNA is generally detectable in upper and lower  respiratory specimens dur ing the acute phase of infection.  Positive  results are indicative of active infection with SARS-CoV-2.  Clinical  correlation with patient history and other diagnostic information is  necessary to determine patient infection status.  Positive results do  not rule out bacterial infection or co-infection with other viruses. If result is PRESUMPTIVE POSTIVE SARS-CoV-2 nucleic acids MAY BE PRESENT.   A presumptive positive result was obtained on the submitted specimen  and confirmed on repeat testing.  While 2019 novel coronavirus  (SARS-CoV-2) nucleic acids may be present in the submitted sample  additional confirmatory testing may be necessary for epidemiological  and / or clinical management purposes  to differentiate  between  SARS-CoV-2 and other Sarbecovirus currently known to infect humans.  If clinically indicated additional testing with an alternate test  methodology [phone] is advised. The SARS-CoV-2 RNA is generally  detectable in upper and lower respiratory sp ecimens during the acute  phase of infection. The expected result is Negative. Fact Sheet for Patients:  BoilerBrush.com.cyhttps://www.fda.gov/media/136312/download Fact Sheet for Healthcare Providers: https://pope.com/https://www.fda.gov/media/136313/download This test is not yet approved or cleared by the [place] FDA and has been authorized for detection and/or diagnosis of SARS-CoV-2 by FDA under an Emergency Use Authorization (EUA).  This EUA will remain in effect (meaning this test can be used) for the duration of the COVID-19 declaration under Section 564(b)(1) of the Act, 21 U.S.C. section 360bbb-3(b)(1), unless the authorization is terminated or revoked sooner. Performed at [facility], [address] [place], [place] [address]   Group B strep by PCR     Status: None   Collection Time: 11/29/18  2:00 AM   Specimen: Vaginal/Rectal; Genital  Result Value Ref Range   Group B strep by PCR NEGATIVE NEGATIVE    Comment: (NOTE) Intrapartum testing with Xpert GBS assay should be used as an adjunct to other methods available and not used to replace antepartum testing (at 35-[redacted] weeks gestation). Performed at [facility], [address] [place], [place] [address]   CBC     Status: Abnormal   Collection Time: 11/30/18  5:43 AM  Result Value Ref Range   WBC 8.5 4.0 - 10.5 K/uL   RBC 3.51 (L) 3.87 - 5.11 MIL/uL   Hemoglobin  10.6 (L) 12.0 - 15.0 g/dL   HCT 96.2 (L) 95.2 - 84.1 %   MCV 89.5 80.0 - 100.0 fL   MCH 30.2 26.0 - 34.0 pg   MCHC 33.8 30.0 - 36.0 g/dL   RDW 32.4 40.1 - 02.7 %   Platelets 139 (L) 150 - 400 K/uL   nRBC 0.0 0.0 - 0.2 %    Comment: Performed at [facility], [address], [place], [place] [address]      Assessment:   33 y.o. [id number] postoperativeday # 2-stable  Continue postpartum/postoperative care Long term Methadone treatment  Baby exhibiting NAS-will be going to NICU Labile blood pressures-most blood pressures WNL, but occasional mild elevation-monitor blood pressures closely  Plan:  1) Mild postoperative anemia - hemodynamically stable and asymptomatic - po ferrous sulfate and vitamins  2) O POS/ RI/ VI. 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 Discussed isoimmunization with patient and husband and implications for future pregnancies.   3) TDAP -given 10/14/2018  4) Bottle/Contraception-Nexplanon  5) Disposition-possible discharge later today  Farrel Conners, CNM

## 2018-12-01 NOTE — Telephone Encounter (Signed)
Patient is schedule for nexplanon insertion on Monday, 12/05/18 with SDJ

## 2018-12-01 NOTE — Discharge Instructions (Signed)
Please call your doctor or return to the ER if you experience any chest pains, shortness of breath, dizziness, visual changes, severe headache (unrelieved by pain meds), fever greater than 101, any heavy bleeding (saturating more than 1 pad per hour), large clots, or foul smelling discharge, any worsening abdominal pain and cramping that is not controlled by pain medication, any calf/leg pain or redness, any breast concerns (redness/pain), or any signs of postpartum depression. No tampons, enemas, douches, or sexual intercourse for 6 weeks. Also avoid tub baths, hot tubs, or swimming for 6 weeks.    Check your incision daily for any signs of infection such as redness, warmth, swelling, increased pain, or pus/foul smelling drainage  Activity: do not lift over 10 lbs for 6 weeks No driving for 1-2 weeks  Pelvic rest for 6 weeks  

## 2018-12-02 NOTE — Progress Notes (Signed)
10/9: Clinical Social Worker (CSW) received a call from [employer] stating that mother left [facility] at 8 am this morning and has not returned. Per RN mother stated that she needed to go to her methadone clinic to get her medicine for the weekend. Per RN in the pediatric section that infant is in a parent has to be with the infant at all times. Per RN when staff contacted the mother she and her boyfriend said they will be at [facility] in 30 minutes and several hours have gone by. Mother has not returned to the hospital. Per RN staff members heard loud music playing in the background while on the phone with mother and her boyfriend. CSW contacted [facility] child protective services (CPS) and made them aware of above. Per CPS the report was screened out because infant's urine drug screen was positive for methadone only, which the mother has a prescription for. CSW made another CPS report and made CPS aware that infant's cord tissue was positive for morphine, fentanyl, methadone, methadone metabolite, tramadol, N-desmethyltramadol, O-desmethyltramadol, alprazolam and alpha-oh-alprazolam. Per CPS worker the case will be screened in now. CSW will continue to follow and assist as needed.   [employer], LCSW [phone]

## 2018-12-05 ENCOUNTER — Ambulatory Visit: Payer: Medicaid Other | Admitting: Obstetrics and Gynecology

## 2018-12-05 NOTE — Telephone Encounter (Signed)
Noted. Nexplanon provided for patient. Patient No show. Nexplanon placed back in stock.

## 2019-02-19 ENCOUNTER — Emergency Department
Admission: EM | Admit: 2019-02-19 | Discharge: 2019-02-19 | Disposition: A | Discharge status: Home / Self Care | Payer: Medicaid Other | Attending: Emergency Medicine | Admitting: Emergency Medicine

## 2019-02-19 ENCOUNTER — Encounter: Payer: Self-pay | Admitting: Emergency Medicine

## 2019-02-19 ENCOUNTER — Other Ambulatory Visit: Payer: Self-pay

## 2019-02-19 DIAGNOSIS — K047 Periapical abscess without sinus: Secondary | ICD-10-CM | POA: Insufficient documentation

## 2019-02-19 DIAGNOSIS — F17210 Nicotine dependence, cigarettes, uncomplicated: Secondary | ICD-10-CM | POA: Diagnosis not present

## 2019-02-19 DIAGNOSIS — K0889 Other specified disorders of teeth and supporting structures: Secondary | ICD-10-CM | POA: Diagnosis present

## 2019-02-19 MED ORDER — AMOXICILLIN 500 MG PO CAPS: 500.0000 mg | ORAL_CAPSULE | Freq: Three times a day (TID) | ORAL | 0 refills | Status: DC

## 2019-02-19 MED ORDER — LIDOCAINE HCL (PF) 1 % IJ SOLN
5.0000 mL | Freq: Once | INTRAMUSCULAR | Status: DC
  Filled 2019-02-19: qty 5

## 2019-02-19 MED ORDER — CEFTRIAXONE SODIUM 1 G IJ SOLR
1.0000 g | Freq: Once | INTRAMUSCULAR | Status: AC
  Administered 2019-02-19: 1 g via INTRAMUSCULAR
  Filled 2019-02-19: qty 10

## 2019-02-19 MED ORDER — IBUPROFEN 800 MG PO TABS: 800.0000 mg | ORAL_TABLET | Freq: Three times a day (TID) | ORAL | 0 refills | Status: DC | PRN

## 2019-02-19 NOTE — ED Notes (Signed)
Pt presents to the ED for dental pain that's been going on for the past couple of days but ha gotten worse overnight. Pt states she has a dental abscess. Swelling noted to the L jaw. Pt states the swelling has gone down because she has taken "a lot of Tylenol". Pt does not know how much she has taken

## 2019-02-19 NOTE — ED Provider Notes (Signed)
[facility] Emergency Department Provider Note  ____________________________________________   First MD Initiated Contact with Patient 02/19/19 1007     (approximate)  I have reviewed the triage vital signs and the nursing notes.   HISTORY  Chief Complaint Dental Pain    HPI Annette Roberts is a 33 y.o. female presents emergency department complaint of left-sided dental pain with swelling.  States got worse overnight.  She took a lot of Tylenol prior to arrival but did not help.  No fever or chills.  No chest pain or shortness of breath.    Past Medical History:  Diagnosis Date  . Current smoker   . Depression    does not want anyone incl  FOB to know  . History of preterm premature rupture of membranes (PPROM)    G2  . 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 Marijuana use   . Methadone maintenance treatment affecting pregnancy (HCC)   . Scoliosis     Patient Active Problem List   Diagnosis Date Noted  . Delivery by cesarean section at 37-39 weeks of gestation due to labor 12/01/2018  . Status post repeat low transverse cesarean section 11/29/2018  . Indication for care in labor and delivery, antepartum 10/28/2018  . Pregnancy related nausea and vomiting, antepartum 06/30/2018  . History of preterm delivery, currently pregnant 05/25/2018  . History of cesarean delivery, currently pregnant 05/25/2018  . History of substance use 04/27/2018  . Supervision of high risk pregnancy, antepartum 04/26/2018  . Hilar lymphadenopathy 05/26/2015  . Tobacco dependence 05/26/2015  . Postpartum care following cesarean delivery 05/22/2015  . Low grade squamous intraepithelial lesion (LGSIL) on cervical Pap smear 2017    Past Surgical History:  Procedure Laterality Date  . CESAREAN SECTION  09/10/2009   FTP/FITL, meconium  . CESAREAN SECTION N/A 05/22/2015   Procedure: CESAREAN SECTION;  Surgeon: Conard Novak, MD;   Location: [facility] ORS;  Service: Obstetrics;  Laterality: N/A;  . CESAREAN SECTION N/A 11/29/2018   Procedure: CESAREAN SECTION;  Surgeon: Conard Novak, MD;  Location: [facility] ORS;  Service: Obstetrics;  Laterality: N/A;    Prior to Admission medications   Medication Sig Start Date End Date Taking? Authorizing Provider  albuterol (VENTOLIN HFA) 108 (90 Base) MCG/ACT inhaler Inhale 1-2 puffs into the lungs every 6 (six) hours as needed for wheezing or shortness of breath. 12/01/18   Farrel Conners, CNM  amoxicillin (AMOXIL) 500 MG capsule Take 1 capsule (500 mg total) by mouth 3 (three) times daily. 02/19/19   Baxter Gonzalez, Roselyn Bering, PA-C  ibuprofen (ADVIL) 800 MG tablet Take 1 tablet (800 mg total) by mouth every 8 (eight) hours as needed. 02/19/19   Sherrie Mustache Roselyn Bering, PA-C  methadone (DOLOPHINE) 10 MG/ML solution Take 95 mg by mouth daily. 11/29/18   [provider]  Prenatal MV & Min w/FA-DHA (PRENATAL ADULT GUMMY/DHA/FA) 0.4-25 MG CHEW Chew 1 Piece by mouth daily. 12/01/18   Farrel Conners, CNM    Allergies Patient has no known allergies.  Family History  Problem Relation Age of Onset  . Diabetes Father   . Hypertension Father   . Heart attack Maternal Grandfather   . Cancer Paternal Grandmother   . Diabetes Nephew   . Heart attack Maternal Aunt   . Heart attack Maternal Uncle     Social History Social History   Tobacco Use  . Smoking status: Current Every Day Smoker    Packs/day: 0.50  Types: Cigarettes  . Smokeless tobacco: Never Used  Substance Use Topics  . Alcohol use: No    Alcohol/week: 0.0 standard drinks  . Drug use: Yes    Types: Marijuana    Review of Systems  Constitutional: No fever/chills Eyes: No visual changes. ENT: No sore throat.  Positive for dental pain Respiratory: Denies cough Genitourinary: Negative for dysuria. Musculoskeletal: Negative for back pain. Skin: Negative for  rash.    ____________________________________________   PHYSICAL EXAM:  VITAL SIGNS: ED Triage Vitals  Enc Vitals Group     BP 02/19/19 0952 (!) 147/94     Pulse Rate 02/19/19 0952 91     Resp 02/19/19 0952 16     Temp 02/19/19 0952 99 F (37.2 C)     Temp Source 02/19/19 0952 Oral     SpO2 02/19/19 0952 100 %     Weight 02/19/19 0951 134 lb 14.7 oz (61.2 kg)     Height --      Head Circumference --      Peak Flow --      Pain Score 02/19/19 0950 10     Pain Loc --      Pain Edu? --      Excl. in [place]? --     Constitutional: Alert and oriented. Well appearing and in no acute distress. Eyes: Conjunctivae are normal.  Head: Atraumatic.  Positive swelling noted at the left lower jaw Nose: No congestion/rhinnorhea. Mouth/Throat: Mucous membranes are moist.  Positive for gum swelling noted in the left lower molars  Neck:  supple no lymphadenopathy noted Cardiovascular: Normal rate, regular rhythm.  Respiratory: Normal respiratory effort.  No retractions GU: deferred Musculoskeletal: FROM all extremities, warm and well perfused Neurologic:  Normal speech and language.  Skin:  Skin is warm, dry and intact. No rash noted. Psychiatric: Mood and affect are normal. Speech and behavior are normal.  ____________________________________________   LABS (all labs ordered are listed, but only abnormal results are displayed)  Labs Reviewed - No data to display ____________________________________________   ____________________________________________  RADIOLOGY    ____________________________________________   PROCEDURES  Procedure(s) performed: Rocephin 1 g IM   Procedures    ____________________________________________   INITIAL IMPRESSION / ASSESSMENT AND PLAN / ED COURSE  Pertinent labs & imaging results that were available during my care of the patient were reviewed by me and considered in my medical decision making (see chart for details).   Patient is  33 year old female presents emergency department with dental pain and swelling.  Physical exam shows dental abscess in the left lower jaw.  Due to the amount of swelling patient was given Rocephin 1 g IM. She is given a prescription for amoxicillin and ibuprofen 800 mg.  Patient takes methadone so feel that we should not give her a narcotic at this time.  Patient is to follow-up with one of the discounted dental clinics or her regular dentist.  She is discharged stable condition.    Annette Roberts was evaluated in Emergency Department on 02/19/2019 for the symptoms described in the history of present illness. She was evaluated in the context of the global COVID-19 pandemic, which necessitated consideration that the patient might be at risk for infection with the SARS-CoV-2 virus that causes COVID-19. Institutional protocols and algorithms that pertain to the evaluation of patients at risk for COVID-19 are in a state of rapid change based on information released by regulatory bodies including the CDC and federal and state organizations. These policies and  algorithms were followed during the patient's care in the ED.   As part of my medical decision making, I reviewed the following data within the electronic MEDICAL RECORD NUMBER Nursing notes reviewed and incorporated, Old chart reviewed, Notes from prior ED visits and [place] Controlled Substance Database  ____________________________________________   FINAL CLINICAL IMPRESSION(S) / ED DIAGNOSES  Final diagnoses:  Dental abscess      NEW MEDICATIONS STARTED DURING THIS VISIT:  New Prescriptions   AMOXICILLIN (AMOXIL) 500 MG CAPSULE    Take 1 capsule (500 mg total) by mouth 3 (three) times daily.   IBUPROFEN (ADVIL) 800 MG TABLET    Take 1 tablet (800 mg total) by mouth every 8 (eight) hours as needed.     Note:  This document was prepared using Dragon voice recognition software and may include unintentional dictation errors.    Faythe GheeFisher,  [place] W, PA-C 02/19/19 1031    Minna AntisPaduchowski, Kevin, MD 02/19/19 1345

## 2019-02-19 NOTE — ED Triage Notes (Signed)
Left lower jaw dental pain 

## 2019-02-19 NOTE — ED Notes (Signed)
No reaction noted to injection site.  

## 2019-02-19 NOTE — Discharge Instructions (Signed)
OPTIONS FOR DENTAL FOLLOW UP CARE ° °[place] Department of Health and Human Services - Local Safety Net Dental Clinics °http://www.ncdhhs.gov/dph/oralhealth/services/safetynetclinics.htm °  °[facility] (336-562-3123) ° °[facility] ([phone]) ° °[facility] ([phone] ext [phone]) ° ° [facility] ([phone]) ° °[facility] ([phone]) °This clinic caters to the indigent population and is on a lottery system. °Location: °[facility], [address], [place] °Clinic Hours: °Wednesdays from 6pm - 9pm, patients seen by a lottery system. °For dates, call or go to [email or web address] °Services: °Cleanings, fillings and simple extractions. °Payment Options: °DENTAL WORK IS FREE OF CHARGE. Bring proof of income or support. °Best way to get seen: °Arrive at 5:15 pm - this is a lottery, NOT first come/first serve, so arriving earlier will not increase your chances of being seen. °  °  °[facility] °[phone] °Select option 1 for emergencies °  °Location: °[facility], [address], [place] °Clinic Hours: °No walk-ins accepted - call the day before to schedule an appointment. °Check in times are 9:30 am and 1:30 pm. °Services: °Simple extractions, temporary fillings, pulpectomy/pulp debridement, uncomplicated abscess drainage. °Payment Options: °PAYMENT IS DUE AT THE TIME OF SERVICE.  Fee is usually $100-200, additional surgical procedures (e.g. abscess drainage) may be extra. °Cash, checks, Visa/MasterCard accepted.  Can file Medicaid if patient is covered for dental - patient should call case worker to check. °No discount for [facility] patients. °Best way to get seen: °MUST call the day before and get onto the schedule. Can usually be seen the next 1-2 days. No walk-ins accepted. °  °  °[facility] °[phone] °   °Location: °[facility], [address], [place] °Clinic Hours: °M, W, Th, F 8am or 1:30pm, Tues 9a or 1:30 - first come/first served. °Services: °Simple extractions, temporary fillings, uncomplicated abscess drainage.  You do not need to be an [place] resident. °Payment Options: °PAYMENT IS DUE AT THE TIME OF SERVICE. °Dental insurance, otherwise sliding scale - bring proof of income or support. °Depending on income and treatment needed, cost is usually $50-200. °Best way to get seen: °Arrive early as it is first come/first served. °  °  °[facility] °[phone] °  °Location: °[address] °Clinic Hours: °Mon-Thu 8a-5p °Services: °Most basic dental services including extractions and fillings. °Payment Options: °PAYMENT IS DUE AT THE TIME OF SERVICE. °Sliding scale, up to 50% off - bring proof if income or support. °Medicaid with dental option accepted. °Best way to get seen: °Call to schedule an appointment, can usually be seen within 2 weeks OR they will try to see walk-ins - show up at 8a or 2p (you may have to wait). °  °  °[facility] °[phone] °[place] RESIDENTS ONLY °  °Location: °[facility], [address], [place], [place] [address] °Clinic Hours: By appointment only. °Monday - Thursday 8am-5pm, Friday 8am-12pm °Services: Cleanings, fillings, extractions. °Payment Options: °PAYMENT IS DUE AT THE TIME OF SERVICE. °Cash, Visa or MasterCard. Sliding scale - $30 minimum per service. °Best way to get seen: °Come in to office, complete packet and make an appointment - need proof of income °or support monies for each household member and proof of [place] residence. °Usually takes about a month to get in. °  °  °[facility] °[phone] °  °Location: °[address]   [place] °Clinic Hours: Walk-in Urgent Care Dental Services are offered Monday-Friday  mornings only. °The numbers of emergencies accepted daily is limited to the number of °providers available. °Maximum 15 - Mondays, Wednesdays & Thursdays °Maximum 10 - Tuesdays & Fridays °Services: °You do not need to be a [place] resident to be seen for a dental emergency. °Emergencies are defined as pain, swelling, abnormal bleeding, or dental trauma. Walkins will receive x-rays if needed. °NOTE: Dental cleaning is not an emergency. °Payment Options: °PAYMENT IS DUE AT THE TIME OF SERVICE. °Minimum co-pay is $40.00 for uninsured patients. °Minimum co-pay is $3.00 for Medicaid with dental coverage. °Dental Insurance is accepted and must be presented at time of visit. °Medicare does not cover dental. °Forms of payment: Cash, credit card, checks. °Best way to get seen: °If not previously registered with the clinic, walk-in dental registration begins at 7:15 am and is on a first come/first serve basis. °If previously registered with the clinic, call to make an appointment. °  °  °[facility] °[phone] °[place] RESIDENTS ONLY °  °Location: °[address], [place], [place] °Clinic Hours: °Mon-Thu 10a-2p °Services: Extractions only! °Payment Options: °FREE (donations accepted) - bring proof of income or support °Best way to get seen: °Call and schedule an appointment OR come at 8am on the 1st Monday of every month (except for holidays) when it is first come/first served. °  °  °[facility] °[phone] °  °Location: °[address], [place] °Clinic Hours: °Friday mornings °Services, Payment Options, Best way to get seen: °Call for info °

## 2019-02-27 ENCOUNTER — Telehealth: Payer: Self-pay | Admitting: Obstetrics and Gynecology

## 2019-02-27 NOTE — Telephone Encounter (Signed)
Called and left voicemail for patient to call back to confirm schedule appointment at the [facility] location with Dr. [name] for Postpartum follow up and nexplanon placement on Thursday, 03/09/19 at 10 am . Per SDJ to schedule next available appointment will do a pregnancy test.

## 2019-02-28 NOTE — Telephone Encounter (Signed)
Attempt to reach patient phone number on file currently not accepting calls at this time °

## 2019-02-28 NOTE — Telephone Encounter (Signed)
Attempt to reach patient phone number on file currently not accepting calls at this time

## 2019-03-03 NOTE — Telephone Encounter (Signed)
Attempt to reach patient phone number on file currently not accepting calls at this time °

## 2019-03-06 NOTE — Telephone Encounter (Signed)
Attempt to reach patient phone number on file currently not accepting calls at this time °

## 2019-03-09 ENCOUNTER — Ambulatory Visit: Payer: Medicaid Other | Admitting: Obstetrics and Gynecology

## 2019-03-24 NOTE — Telephone Encounter (Signed)
Patient r/s 6 wk PP/nexplanon placement 2/11 with SDJ/MB

## 2019-04-06 ENCOUNTER — Ambulatory Visit: Payer: Medicaid Other | Admitting: Obstetrics and Gynecology

## 2019-05-15 NOTE — Telephone Encounter (Signed)
Patient No show apt 03/09/19 & 04/06/19. Nexplanon placed back in stock.

## 2019-07-29 ENCOUNTER — Ambulatory Visit: Admit: 2019-07-29 | Discharge: 2019-07-29 | Disposition: A | Discharge status: Home / Self Care | Payer: MEDICAID

## 2019-07-29 DIAGNOSIS — R6883 Chills (without fever): Principal | ICD-10-CM

## 2019-07-29 DIAGNOSIS — F13230 Sedative, hypnotic or anxiolytic dependence with withdrawal, uncomplicated: Principal | ICD-10-CM

## 2019-07-29 DIAGNOSIS — R110 Nausea: Principal | ICD-10-CM

## 2019-07-29 DIAGNOSIS — F1113 Opioid abuse with withdrawal: Principal | ICD-10-CM

## 2019-07-31 ENCOUNTER — Ambulatory Visit
Admit: 2019-07-31 | Discharge: 2019-07-31 | Disposition: A | Discharge status: Home / Self Care | Payer: MEDICAID | Attending: Emergency Medicine

## 2019-07-31 DIAGNOSIS — F13230 Sedative, hypnotic or anxiolytic dependence with withdrawal, uncomplicated: Principal | ICD-10-CM

## 2019-07-31 DIAGNOSIS — F1123 Opioid dependence with withdrawal: Principal | ICD-10-CM

## 2019-07-31 MED ORDER — METHOCARBAMOL 500 MG TABLET
ORAL_TABLET | Freq: Two times a day (BID) | ORAL | 0 refills | 10 days | Status: CP
Start: 2019-07-31 — End: 2019-08-10

## 2019-07-31 MED ORDER — CHLORDIAZEPOXIDE 25 MG CAPSULE
ORAL_CAPSULE | Freq: Three times a day (TID) | ORAL | 0 refills | 5.00000 days | Status: CP
Start: 2019-07-31 — End: 2019-08-05

## 2019-08-01 ENCOUNTER — Ambulatory Visit: Admit: 2019-08-01 | Discharge: 2019-08-02 | Disposition: A | Discharge status: Home / Self Care | Payer: MEDICAID

## 2019-08-01 DIAGNOSIS — F13230 Sedative, hypnotic or anxiolytic dependence with withdrawal, uncomplicated: Principal | ICD-10-CM

## 2019-08-01 DIAGNOSIS — F1123 Opioid dependence with withdrawal: Principal | ICD-10-CM

## 2019-08-01 MED ORDER — BUPRENORPHINE 8 MG-NALOXONE 2 MG SUBLINGUAL FILM
ORAL_FILM | Freq: Every day | SUBLINGUAL | 0 refills | 5.00000 days | Status: CP
Start: 2019-08-01 — End: 2019-08-06

## 2020-05-18 ENCOUNTER — Other Ambulatory Visit: Payer: Self-pay

## 2020-05-18 ENCOUNTER — Emergency Department
Admission: EM | Admit: 2020-05-18 | Discharge: 2020-05-18 | Disposition: A | Discharge status: Home / Self Care | Payer: Medicaid Other | Attending: Emergency Medicine | Admitting: Emergency Medicine

## 2020-05-18 DIAGNOSIS — F17210 Nicotine dependence, cigarettes, uncomplicated: Secondary | ICD-10-CM | POA: Insufficient documentation

## 2020-05-18 DIAGNOSIS — K029 Dental caries, unspecified: Secondary | ICD-10-CM | POA: Diagnosis not present

## 2020-05-18 DIAGNOSIS — K0889 Other specified disorders of teeth and supporting structures: Secondary | ICD-10-CM | POA: Diagnosis present

## 2020-05-18 MED ORDER — AMOXICILLIN-POT CLAVULANATE 875-125 MG PO TABS: 1.0000 | ORAL_TABLET | Freq: Two times a day (BID) | ORAL | 0 refills | Status: AC

## 2020-05-18 MED ORDER — KETOROLAC TROMETHAMINE 10 MG PO TABS: 10.0000 mg | ORAL_TABLET | Freq: Four times a day (QID) | ORAL | 0 refills | Status: AC | PRN

## 2020-05-18 MED ORDER — AMOXICILLIN-POT CLAVULANATE 875-125 MG PO TABS
1.0000 | ORAL_TABLET | Freq: Once | ORAL | Status: AC
  Administered 2020-05-18: 1 via ORAL
  Filled 2020-05-18: qty 1

## 2020-05-18 MED ORDER — KETOROLAC TROMETHAMINE 60 MG/2ML IM SOLN
30.0000 mg | Freq: Once | INTRAMUSCULAR | Status: AC
  Administered 2020-05-18: 30 mg via INTRAMUSCULAR
  Filled 2020-05-18: qty 2

## 2020-05-18 NOTE — ED Notes (Signed)
Patient is alert and oriented x4, ambulatory, and in no acute distress. Will continue to monitor and assess. 

## 2020-05-18 NOTE — Discharge Instructions (Signed)
OPTIONS FOR DENTAL FOLLOW UP CARE ° °[place] Department of Health and Human Services - Local Safety Net Dental Clinics °http://www.ncdhhs.gov/dph/oralhealth/services/safetynetclinics.htm °  °[facility] ([phone]) ° °[facility] ([phone]) ° °[facility] ([phone] ext [phone]) ° °[facility] ([phone]) ° °[facility] ([phone]) °This clinic caters to the indigent population and is on a lottery system. °Location: °[facility], [address], [place] °Clinic Hours: °Wednesdays from 6pm - 9pm, patients seen by a lottery system. °For dates, call or go to [email or web address] °Services: °Cleanings, fillings and simple extractions. °Payment Options: °DENTAL WORK IS FREE OF CHARGE. Bring proof of income or support. °Best way to get seen: °Arrive at 5:15 pm - this is a lottery, NOT first come/first serve, so arriving earlier will not increase your chances of being seen. °  °  °[facility] °[phone] °Select option 1 for emergencies °  °Location: °[facility], [address], [place] °Clinic Hours: °No walk-ins accepted - call the day before to schedule an appointment. °Check in times are 9:30 am and 1:30 pm. °Services: °Simple extractions, temporary fillings, pulpectomy/pulp debridement, uncomplicated abscess drainage. °Payment Options: °PAYMENT IS DUE AT THE TIME OF SERVICE.  Fee is usually $100-200, additional surgical procedures (e.g. abscess drainage) may be extra. °Cash, checks, Visa/MasterCard accepted.  Can file Medicaid if patient is covered for dental - patient should call case worker to check. °No discount for [facility] patients. °Best way to get seen: °MUST call the day before and get onto the schedule. Can usually be seen the next 1-2 days. No walk-ins accepted. °  °  °[facility] °[phone] °   °Location: °[facility], [address], [place] °Clinic Hours: °M, W, Th, F 8am or 1:30pm, Tues 9a or 1:30 - first come/first served. °Services: °Simple extractions, temporary fillings, uncomplicated abscess drainage.  You do not need to be an [place] resident. °Payment Options: °PAYMENT IS DUE AT THE TIME OF SERVICE. °Dental insurance, otherwise sliding scale - bring proof of income or support. °Depending on income and treatment needed, cost is usually $50-200. °Best way to get seen: °Arrive early as it is first come/first served. °  °  °[facility] °[phone] °  °Location: °[address] °Clinic Hours: °Mon-Thu 8a-5p °Services: °Most basic dental services including extractions and fillings. °Payment Options: °PAYMENT IS DUE AT THE TIME OF SERVICE. °Sliding scale, up to 50% off - bring proof if income or support. °Medicaid with dental option accepted. °Best way to get seen: °Call to schedule an appointment, can usually be seen within 2 weeks OR they will try to see walk-ins - show up at 8a or 2p (you may have to wait). °  °  °[facility] °[phone] °[place]IDENTS ONLY °  °Location: °[facility], [address], [place], [place] [address] °Clinic Hours: By appointment only. °Monday - Thursday 8am-5pm, Friday 8am-12pm °Services: Cleanings, fillings, extractions. °Payment Options: °PAYMENT IS DUE AT THE TIME OF SERVICE. °Cash, Visa or MasterCard. Sliding scale - $30 minimum per service. °Best way to get seen: °Come in to office, complete packet and make an appointment - need proof of income °or support monies for each household member and proof of [place] residence. °Usually takes about a month to get in. °  °  °[facility] °[phone] °  °Location: °[address]   [place] °Clinic Hours: Walk-in Urgent Care Dental Services are offered Monday-Friday  mornings only. °The numbers of emergencies accepted daily is limited to the number of °providers available. °Maximum 15 - Mondays, Wednesdays & Thursdays °Maximum 10 - Tuesdays & Fridays °Services: °You do not need to be a [place] resident to be seen for a dental emergency. °Emergencies are defined as pain, swelling, abnormal bleeding, or dental trauma. Walkins will receive x-rays if needed. °NOTE: Dental cleaning is not an emergency. °Payment Options: °PAYMENT IS DUE AT THE TIME OF SERVICE. °Minimum co-pay is $40.00 for uninsured patients. °Minimum co-pay is $3.00 for Medicaid with dental coverage. °Dental Insurance is accepted and must be presented at time of visit. °Medicare does not cover dental. °Forms of payment: Cash, credit card, checks. °Best way to get seen: °If not previously registered with the clinic, walk-in dental registration begins at 7:15 am and is on a first come/first serve basis. °If previously registered with the clinic, call to make an appointment. °  °  °[facility] °[phone] °[place] RESIDENTS ONLY °  °Location: °[address], [place], [place] °Clinic Hours: °Mon-Thu 10a-2p °Services: Extractions only! °Payment Options: °FREE (donations accepted) - bring proof of income or support °Best way to get seen: °Call and schedule an appointment OR come at 8am on the 1st Monday of every month (except for holidays) when it is first come/first served. °  °  °[facility] °[phone] °  °Location: °[address], [place] °Clinic Hours: °Friday mornings °Services, Payment Options, Best way to get seen: °Call for info °

## 2020-05-18 NOTE — ED Triage Notes (Signed)
Pt comes pov with back right sided dental pain. States she had an old antibiotic but didn't have enough.

## 2020-05-19 NOTE — ED Provider Notes (Signed)
[facility] Emergency Department Provider Note  ____________________________________________   Event Date/Time   First MD Initiated Contact with Patient 05/18/20 2023     (approximate)  I have reviewed the triage vital signs and the nursing notes.   HISTORY  Chief Complaint Dental Pain  HPI Annette Roberts is a 35 y.o. female who presents to the emergency department for evaluation of right lower dental pain that has been present for the last 6 to 7 days.  Patient states that she had some old remaining antibiotic that she began taking, but does not think that it was enough.  She is unsure what this antibiotic was.  She reports continued pain in the region despite antibiotic use.  She has not tried any other medications.  She denies any facial swelling, fever.  She does report that the tooth is loose, and this was loose prior to it becoming recently painful.         Past Medical History:  Diagnosis Date  . Current smoker   . Depression    does not want anyone incl  FOB to know  . History of preterm premature rupture of membranes (PPROM)    G2  . 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 Marijuana use   . Methadone maintenance treatment affecting pregnancy (HCC)   . Scoliosis     Patient Active Problem List   Diagnosis Date Noted  . Delivery by cesarean section at 37-39 weeks of gestation due to labor 12/01/2018  . Status post repeat low transverse cesarean section 11/29/2018  . Indication for care in labor and delivery, antepartum 10/28/2018  . Pregnancy related nausea and vomiting, antepartum 06/30/2018  . History of preterm delivery, currently pregnant 05/25/2018  . History of cesarean delivery, currently pregnant 05/25/2018  . History of substance use 04/27/2018  . Supervision of high risk pregnancy, antepartum 04/26/2018  . Hilar lymphadenopathy 05/26/2015  . Tobacco dependence 05/26/2015  . Postpartum  care following cesarean delivery 05/22/2015  . Low grade squamous intraepithelial lesion (LGSIL) on cervical Pap smear 2017    Past Surgical History:  Procedure Laterality Date  . CESAREAN SECTION  09/10/2009   FTP/FITL, meconium  . CESAREAN SECTION N/A 05/22/2015   Procedure: CESAREAN SECTION;  Surgeon: Conard Novak, MD;  Location: [facility] ORS;  Service: Obstetrics;  Laterality: N/A;  . CESAREAN SECTION N/A 11/29/2018   Procedure: CESAREAN SECTION;  Surgeon: Conard Novak, MD;  Location: [facility] ORS;  Service: Obstetrics;  Laterality: N/A;    Prior to Admission medications   Medication Sig Start Date End Date Taking? Authorizing Provider  amoxicillin-clavulanate (AUGMENTIN) 875-125 MG tablet Take 1 tablet by mouth 2 (two) times daily for 7 days. 05/18/20 05/25/20 Yes Rodgers, Ruben Gottron, PA  ketorolac (TORADOL) 10 MG tablet Take 1 tablet (10 mg total) by mouth every 6 (six) hours as needed for up to 5 days. 05/18/20 05/23/20 Yes Rodgers, Ruben Gottron, PA  albuterol (VENTOLIN HFA) 108 (90 Base) MCG/ACT inhaler Inhale 1-2 puffs into the lungs every 6 (six) hours as needed for wheezing or shortness of breath. 12/01/18   Farrel Conners, CNM  amoxicillin (AMOXIL) 500 MG capsule Take 1 capsule (500 mg total) by mouth 3 (three) times daily. 02/19/19   Fisher, Roselyn Bering, PA-C  ibuprofen (ADVIL) 800 MG tablet Take 1 tablet (800 mg total) by mouth every 8 (eight) hours as needed. 02/19/19   Faythe Ghee, PA-C  methadone (DOLOPHINE) 10 MG/ML  solution Take 95 mg by mouth daily. 11/29/18   [provider]  Prenatal MV & Min w/FA-DHA (PRENATAL ADULT GUMMY/DHA/FA) 0.4-25 MG CHEW Chew 1 Piece by mouth daily. 12/01/18   Farrel Conners, CNM    Allergies Patient has no known allergies.  Family History  Problem Relation Age of Onset  . Diabetes Father   . Hypertension Father   . Heart attack Maternal Grandfather   . Cancer Paternal Grandmother   . Diabetes Nephew   . Heart attack Maternal Aunt    . Heart attack Maternal Uncle     Social History Social History   Tobacco Use  . Smoking status: Current Every Day Smoker    Packs/day: 0.50    Types: Cigarettes  . Smokeless tobacco: Never Used  Vaping Use  . Vaping Use: Never used  Substance Use Topics  . Alcohol use: No    Alcohol/week: 0.0 standard drinks  . Drug use: Yes    Types: Marijuana    Review of Systems Constitutional: No fever/chills Eyes: No visual changes. ENT: + Dental pain, no sore throat. Cardiovascular: Denies chest pain. Respiratory: Denies shortness of breath. Gastrointestinal: No abdominal pain.  No nausea, no vomiting.  No diarrhea.  No constipation. Genitourinary: Negative for dysuria. Musculoskeletal: Negative for back pain. Skin: Negative for rash. Neurological: Negative for headaches, focal weakness or numbness.  ____________________________________________   PHYSICAL EXAM:  VITAL SIGNS: ED Triage Vitals  Enc Vitals Group     BP 05/18/20 1915 (!) 143/84     Pulse Rate 05/18/20 1915 74     Resp 05/18/20 1915 17     Temp 05/18/20 1915 98.7 F (37.1 C)     Temp Source 05/18/20 1915 Oral     SpO2 05/18/20 1915 97 %     Weight 05/18/20 1911 130 lb (59 kg)     Height 05/18/20 1911 5\' 2"  (1.575 m)     Head Circumference --      Peak Flow --      Pain Score 05/18/20 1911 7     Pain Loc --      Pain Edu? --      Excl. in GC? --    Constitutional: Alert and oriented. Well appearing and in no acute distress. Eyes: Conjunctivae are normal. PERRL. EOMI. Head: Atraumatic. Nose: No congestion/rhinnorhea. Mouth/Throat: There is no external facial swelling of the soft tissues, no erythema.  Mucous membranes are moist.  Oropharynx non-erythematous.  There are multiple prior removed teeth, including on the right lower mandible.  The remaining posterior most tooth is mildly loose with surrounding gum erythema and inflammation.  No fluctuance or area of suspected periapical abscess.  No other oral  lesions appreciated. Neck: No stridor.   Lymphatic: No cervical lymphadenopathy Cardiovascular: Normal rate, regular rhythm. Grossly normal heart sounds.  Good peripheral circulation. Respiratory: Normal respiratory effort.  No retractions. Lungs CTAB. Neurologic:  Normal speech and language. No gross focal neurologic deficits are appreciated. No gait instability. Skin:  Skin is warm, dry and intact. No rash noted. Psychiatric: Mood and affect are normal. Speech and behavior are normal.   ____________________________________________   INITIAL IMPRESSION / ASSESSMENT AND PLAN / ED COURSE  As part of my medical decision making, I reviewed the following data within the electronic MEDICAL RECORD NUMBER Nursing notes reviewed and incorporated and Notes from prior ED visits        Patient is a 35 year old female who presents to the emergency department for evaluation of tooth  pain over the last 6 days, incompletely treated by antibiotics she attempted at home.  See HPI for further details.  In triage, patient is afebrile.  On physical exam, she does not have any external facial swelling or erythema.  There is a right lower mandible tooth that is slightly loose with surrounding erythema and inflammation of the gumline.  No evidence of current periapical abscess.  Given the absence of fever, absence of gross abscess in the mouth, feel the patient is amenable for outpatient antibiotic trial with a full course of antibiotics.  Will start the patient on Augmentin given that she cannot tell me with the prior antibiotic was.  We will also provide the patient a shot of Toradol here with 5 days of Toradol tablets for home.  Patient is amenable with plan, return precautions were discussed.  Patient stable this time for outpatient follow-up.      ____________________________________________   FINAL CLINICAL IMPRESSION(S) / ED DIAGNOSES  Final diagnoses:  Pain due to dental caries     ED Discharge Orders          Ordered    ketorolac (TORADOL) 10 MG tablet  Every 6 hours PRN        05/18/20 2137    amoxicillin-clavulanate (AUGMENTIN) 875-125 MG tablet  2 times daily        05/18/20 2137          *Please note:  Tameah A Letson was evaluated in Emergency Department on 05/19/2020 for the symptoms described in the history of present illness. She was evaluated in the context of the global COVID-19 pandemic, which necessitated consideration that the patient might be at risk for infection with the SARS-CoV-2 virus that causes COVID-19. Institutional protocols and algorithms that pertain to the evaluation of patients at risk for COVID-19 are in a state of rapid change based on information released by regulatory bodies including the CDC and federal and state organizations. These policies and algorithms were followed during the patient's care in the ED.  Some ED evaluations and interventions may be delayed as a result of limited staffing during and the pandemic.*   Note:  This document was prepared using Dragon voice recognition software and may include unintentional dictation errors.   Lucy Chris, PA 05/19/20 1526    Shaune Pollack, MD 05/20/20 [phone]

## 2020-07-01 ENCOUNTER — Ambulatory Visit: Payer: Medicaid Other | Admitting: Obstetrics and Gynecology

## 2020-07-03 ENCOUNTER — Observation Stay
Admission: EM | Admit: 2020-07-03 | Discharge: 2020-07-04 | Disposition: A | Discharge status: Home / Self Care | Payer: Medicaid Other | Attending: Internal Medicine | Admitting: Internal Medicine

## 2020-07-03 ENCOUNTER — Other Ambulatory Visit: Payer: Self-pay

## 2020-07-03 ENCOUNTER — Observation Stay: Payer: Medicaid Other

## 2020-07-03 ENCOUNTER — Emergency Department: Payer: Medicaid Other

## 2020-07-03 ENCOUNTER — Encounter: Payer: Self-pay | Admitting: Emergency Medicine

## 2020-07-03 DIAGNOSIS — G928 Other toxic encephalopathy: Secondary | ICD-10-CM | POA: Diagnosis not present

## 2020-07-03 DIAGNOSIS — T43205A Adverse effect of unspecified antidepressants, initial encounter: Secondary | ICD-10-CM | POA: Diagnosis not present

## 2020-07-03 DIAGNOSIS — M791 Myalgia, unspecified site: Secondary | ICD-10-CM | POA: Diagnosis not present

## 2020-07-03 DIAGNOSIS — G2571 Drug induced akathisia: Secondary | ICD-10-CM | POA: Diagnosis not present

## 2020-07-03 DIAGNOSIS — R4182 Altered mental status, unspecified: Secondary | ICD-10-CM | POA: Insufficient documentation

## 2020-07-03 DIAGNOSIS — R410 Disorientation, unspecified: Secondary | ICD-10-CM | POA: Diagnosis present

## 2020-07-03 DIAGNOSIS — Z20822 Contact with and (suspected) exposure to covid-19: Secondary | ICD-10-CM | POA: Diagnosis not present

## 2020-07-03 DIAGNOSIS — F1111 Opioid abuse, in remission: Secondary | ICD-10-CM

## 2020-07-03 DIAGNOSIS — G9341 Metabolic encephalopathy: Secondary | ICD-10-CM

## 2020-07-03 DIAGNOSIS — T887XXA Unspecified adverse effect of drug or medicament, initial encounter: Secondary | ICD-10-CM | POA: Insufficient documentation

## 2020-07-03 DIAGNOSIS — R251 Tremor, unspecified: Secondary | ICD-10-CM

## 2020-07-03 DIAGNOSIS — E871 Hypo-osmolality and hyponatremia: Secondary | ICD-10-CM

## 2020-07-03 DIAGNOSIS — F17210 Nicotine dependence, cigarettes, uncomplicated: Secondary | ICD-10-CM | POA: Insufficient documentation

## 2020-07-03 DIAGNOSIS — Y900 Blood alcohol level of less than 20 mg/100 ml: Secondary | ICD-10-CM | POA: Insufficient documentation

## 2020-07-03 DIAGNOSIS — T50905A Adverse effect of unspecified drugs, medicaments and biological substances, initial encounter: Secondary | ICD-10-CM

## 2020-07-03 LAB — CBC WITH DIFFERENTIAL/PLATELET
Abs Immature Granulocytes: 0.01 10*3/uL (ref 0.00–0.07)
Basophils Absolute: 0.1 10*3/uL (ref 0.0–0.1)
Basophils Relative: 1 %
Eosinophils Absolute: 0.3 10*3/uL (ref 0.0–0.5)
Eosinophils Relative: 4 %
HCT: 32.3 % — ABNORMAL LOW (ref 36.0–46.0)
Hemoglobin: 11.4 g/dL — ABNORMAL LOW (ref 12.0–15.0)
Immature Granulocytes: 0 %
Lymphocytes Relative: 28 %
Lymphs Abs: 2 10*3/uL (ref 0.7–4.0)
MCH: 28.9 pg (ref 26.0–34.0)
MCHC: 35.3 g/dL (ref 30.0–36.0)
MCV: 82 fL (ref 80.0–100.0)
Monocytes Absolute: 0.6 10*3/uL (ref 0.1–1.0)
Monocytes Relative: 9 %
Neutro Abs: 4.2 10*3/uL (ref 1.7–7.7)
Neutrophils Relative %: 58 %
Platelets: 202 10*3/uL (ref 150–400)
RBC: 3.94 MIL/uL (ref 3.87–5.11)
RDW: 12.5 % (ref 11.5–15.5)
WBC: 7.2 10*3/uL (ref 4.0–10.5)
nRBC: 0 % (ref 0.0–0.2)

## 2020-07-03 LAB — URINALYSIS, COMPLETE (UACMP) WITH MICROSCOPIC
Bilirubin Urine: NEGATIVE
Glucose, UA: NEGATIVE mg/dL
Hgb urine dipstick: NEGATIVE
Ketones, ur: NEGATIVE mg/dL
Nitrite: NEGATIVE
Protein, ur: 30 mg/dL — AB
Specific Gravity, Urine: 1.021 (ref 1.005–1.030)
pH: 9 — ABNORMAL HIGH (ref 5.0–8.0)

## 2020-07-03 LAB — RESP PANEL BY RT-PCR (FLU A&B, COVID) ARPGX2
Influenza A by PCR: NEGATIVE
Influenza B by PCR: NEGATIVE
SARS Coronavirus 2 by RT PCR: NEGATIVE

## 2020-07-03 LAB — URINE DRUG SCREEN, QUALITATIVE (ARMC ONLY)
Amphetamines, Ur Screen: NOT DETECTED
Barbiturates, Ur Screen: NOT DETECTED
Benzodiazepine, Ur Scrn: POSITIVE — AB
Cannabinoid 50 Ng, Ur ~~LOC~~: NOT DETECTED
Cocaine Metabolite,Ur ~~LOC~~: NOT DETECTED
MDMA (Ecstasy)Ur Screen: NOT DETECTED
Methadone Scn, Ur: NOT DETECTED
Opiate, Ur Screen: NOT DETECTED
Phencyclidine (PCP) Ur S: NOT DETECTED
Tricyclic, Ur Screen: POSITIVE — AB

## 2020-07-03 LAB — COMPREHENSIVE METABOLIC PANEL
ALT: 39 U/L (ref 0–44)
AST: 42 U/L — ABNORMAL HIGH (ref 15–41)
Albumin: 4.5 g/dL (ref 3.5–5.0)
Alkaline Phosphatase: 78 U/L (ref 38–126)
Anion gap: 10 (ref 5–15)
BUN: 8 mg/dL (ref 6–20)
CO2: 21 mmol/L — ABNORMAL LOW (ref 22–32)
Calcium: 9 mg/dL (ref 8.9–10.3)
Chloride: 103 mmol/L (ref 98–111)
Creatinine, Ser: 0.59 mg/dL (ref 0.44–1.00)
GFR, Estimated: >60 mL/min (ref >60)
Glucose, Bld: 104 mg/dL — ABNORMAL HIGH (ref 70–99)
Potassium: 3.7 mmol/L (ref 3.5–5.1)
Sodium: 134 mmol/L — ABNORMAL LOW (ref 135–145)
Total Bilirubin: 0.5 mg/dL (ref 0.3–1.2)
Total Protein: 7.1 g/dL (ref 6.5–8.1)

## 2020-07-03 LAB — CK: Total CK: 93 U/L (ref 38–234)

## 2020-07-03 LAB — MAGNESIUM: Magnesium: 1.9 mg/dL (ref 1.7–2.4)

## 2020-07-03 LAB — HCG, QUANTITATIVE, PREGNANCY: hCG, Beta Chain, Quant, S: <1 m[IU]/mL (ref <5)

## 2020-07-03 LAB — ACETAMINOPHEN LEVEL: Acetaminophen (Tylenol), Serum: <10 ug/mL — ABNORMAL LOW (ref 10–30)

## 2020-07-03 LAB — SALICYLATE LEVEL: Salicylate Lvl: <7 mg/dL — ABNORMAL LOW (ref 7.0–30.0)

## 2020-07-03 LAB — TSH: TSH: 2.169 u[IU]/mL (ref 0.350–4.500)

## 2020-07-03 LAB — ETHANOL: Alcohol, Ethyl (B): <10 mg/dL (ref <10)

## 2020-07-03 LAB — PROCALCITONIN: Procalcitonin: <0.1 ng/mL

## 2020-07-03 MED ORDER — ACETAMINOPHEN 325 MG PO TABS
650.0000 mg | ORAL_TABLET | Freq: Four times a day (QID) | ORAL | Status: DC | PRN
  Administered 2020-07-03: 650 mg via ORAL
  Filled 2020-07-03: qty 2

## 2020-07-03 MED ORDER — ACETAMINOPHEN 650 MG RE SUPP: 650.0000 mg | Freq: Four times a day (QID) | RECTAL | Status: DC | PRN

## 2020-07-03 MED ORDER — ONDANSETRON HCL 4 MG PO TABS: 4.0000 mg | ORAL_TABLET | Freq: Four times a day (QID) | ORAL | Status: DC | PRN

## 2020-07-03 MED ORDER — MIDAZOLAM HCL 2 MG/2ML IJ SOLN
1.0000 mg | Freq: Once | INTRAMUSCULAR | Status: AC
  Administered 2020-07-03: 1 mg via INTRAVENOUS

## 2020-07-03 MED ORDER — ONDANSETRON HCL 4 MG/2ML IJ SOLN: 4.0000 mg | Freq: Four times a day (QID) | INTRAMUSCULAR | Status: DC | PRN

## 2020-07-03 MED ORDER — DIPHENHYDRAMINE HCL 50 MG/ML IJ SOLN
50.0000 mg | Freq: Once | INTRAMUSCULAR | Status: AC
  Administered 2020-07-03: 50 mg via INTRAVENOUS
  Filled 2020-07-03: qty 1

## 2020-07-03 MED ORDER — MIDAZOLAM HCL 2 MG/2ML IJ SOLN
2.0000 mg | Freq: Once | INTRAMUSCULAR | Status: AC
  Administered 2020-07-03: 2 mg via INTRAVENOUS
  Filled 2020-07-03: qty 2

## 2020-07-03 MED ORDER — LACTATED RINGERS IV BOLUS
1000.0000 mL | Freq: Once | INTRAVENOUS | Status: AC
  Administered 2020-07-03: 1000 mL via INTRAVENOUS

## 2020-07-03 MED ORDER — MIDAZOLAM HCL 2 MG/2ML IJ SOLN
2.0000 mg | Freq: Once | INTRAMUSCULAR | Status: DC
  Filled 2020-07-03: qty 2

## 2020-07-03 MED ORDER — LORAZEPAM 2 MG/ML IJ SOLN
1.0000 mg | INTRAMUSCULAR | Status: DC | PRN
  Administered 2020-07-03 – 2020-07-04 (×2): 1 mg via INTRAVENOUS
  Filled 2020-07-03 (×2): qty 1

## 2020-07-03 MED ORDER — ENOXAPARIN SODIUM 40 MG/0.4ML IJ SOSY
40.0000 mg | PREFILLED_SYRINGE | INTRAMUSCULAR | Status: DC
  Administered 2020-07-03: 40 mg via SUBCUTANEOUS
  Filled 2020-07-03: qty 0.4

## 2020-07-03 NOTE — ED Notes (Signed)
Patient lucid at the moment. Patient alert and stating that she took the newly prescribed medication around 0230 today. Patient states that she continues to feel restless and that her legs feel heavy.

## 2020-07-03 NOTE — ED Notes (Signed)
Patient transported to CT with sitter at this time. Plan to go directly to the floor from CT. Patient reports continued agitation at this time. Remains alert and able to follow commands.

## 2020-07-03 NOTE — ED Notes (Signed)
Seizure pads applied to bed rails for patient protection. Patient attempting to stick head through railing, lick side rails, and push side rails down.

## 2020-07-03 NOTE — ED Notes (Signed)
Patient significant other went to pick up medication that patient took earlier today. Patient prescription for Fluvoxamine 100mg  tablets not opened at this time. Patient had prescription for Lybalvi opened and took one pill according to dosing instructions. MD aware. Patient appears to be resting more comfortably at this time. Sitter and s/o present at bedside.

## 2020-07-03 NOTE — ED Notes (Signed)
ED Provider at bedside. 

## 2020-07-03 NOTE — Plan of Care (Signed)

## 2020-07-03 NOTE — H&P (Addendum)
History and Physical    Annette Roberts YQM:[id number] DOB: October 10, 1985 DOA: 07/03/2020  PCP: Nira Retort   Patient coming from: Home  I have personally briefly reviewed patient's old medical records in [facility]  Chief Complaint: Confusion, bizarre behavior  HPI: Annette Roberts is a 35 y.o. female with medical history significant for past history of opioid abuse, currently on Suboxone, nicotine dependence, anxiety, started today on Prozac and Lybalvi who was brought in by EMS with body pain and shaking starting a few hours after taking the first dose of the new medication Lybalvi.  Most of the history is taken from ER records as patient is unable to contribute to history.  Significant other contributes to history and states patient has not taken any other medications he states that she does not use any illicit drugs nor consume alcohol.  She has been doing on the Suboxone for the past 9 months ED Course: On arrival,   Patient was confused, restless and agitated, not making sense, reaching for things that weren't there.Afebrile, BP 142/82, mildly tachycardic at 103, O2 sat 97% on room air.  CBC and CMP unremarkable.  EtOH, salicylate and acetaminophen levels undetectable.  TSH and CK normal.  Urinalysis mostly normal.  COVID and flu negative. EKG as reviewed by me : Normal sinus rhythm at 87 Imaging: Head CT grossly normal per ER read in the emergency room requiring IV Benadryl and 3 doses of Versed.  Hospitalist consulted for admission.  At the time of my evaluation, patient less confused, was able to give a little more history and stated that for the past 3 months she has been on 3 carbamazepine tablets as well as hydroxyzine nightly for sleep.  She saw a new doctor a couple days ago with continued complaints of  Difficulty sleeping and anxiety. She was then prescribed Prozac and Lybalvi with the intention of it replacing the carbamazepine and hydroxyzine  Review of Systems:  Review of systems negative   Past Medical History:  Diagnosis Date  . Current smoker   . Depression    does not want anyone incl  FOB to know  . History of preterm premature rupture of membranes (PPROM)    G2  . 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 Marijuana use   . Methadone maintenance treatment affecting pregnancy (HCC)   . Scoliosis     Past Surgical History:  Procedure Laterality Date  . CESAREAN SECTION  09/10/2009   FTP/FITL, meconium  . CESAREAN SECTION N/A 05/22/2015   Procedure: CESAREAN SECTION;  Surgeon: Conard Novak, MD;  Location: [facility] ORS;  Service: Obstetrics;  Laterality: N/A;  . CESAREAN SECTION N/A 11/29/2018   Procedure: CESAREAN SECTION;  Surgeon: Conard Novak, MD;  Location: [facility] ORS;  Service: Obstetrics;  Laterality: N/A;     reports that she has been smoking cigarettes. She has been smoking about 0.50 packs per day. She has never used smokeless tobacco. She reports current drug use. Drug: Marijuana. She reports that she does not drink alcohol.  No Known Allergies  Family History  Problem Relation Age of Onset  . Diabetes Father   . Hypertension Father   . Heart attack Maternal Grandfather   . Cancer Paternal Grandmother   . Diabetes Nephew   . Heart attack Maternal Aunt   . Heart attack Maternal Uncle       Prior to Admission medications   Medication Sig Start Date End  Date Taking? Authorizing Provider  albuterol (VENTOLIN HFA) 108 (90 Base) MCG/ACT inhaler Inhale 1-2 puffs into the lungs every 6 (six) hours as needed for wheezing or shortness of breath. 12/01/18   Farrel Conners, CNM  amoxicillin (AMOXIL) 500 MG capsule Take 1 capsule (500 mg total) by mouth 3 (three) times daily. 02/19/19   Fisher, Roselyn Bering, PA-C  ibuprofen (ADVIL) 800 MG tablet Take 1 tablet (800 mg total) by mouth every 8 (eight) hours as needed. 02/19/19   Sherrie Mustache Roselyn Bering, PA-C  methadone (DOLOPHINE) 10 MG/ML solution  Take 95 mg by mouth daily. 11/29/18   [provider]  Prenatal MV & Min w/FA-DHA (PRENATAL ADULT GUMMY/DHA/FA) 0.4-25 MG CHEW Chew 1 Piece by mouth daily. 12/01/18   Farrel Conners, CNM    Physical Exam: Vitals:   07/03/20 1843 07/03/20 1846 07/03/20 2014 07/03/20 2058  BP:  (!) 138/93 (!) 142/82   Pulse:  (!) 102 85   Resp:  20 20 18   Temp: 98.4 F (36.9 C)  98.8 F (37.1 C) 98.6 F (37 C)  TempSrc: Oral  Oral Oral  SpO2:  100% 97%   Weight:      Height:         Vitals:   07/03/20 1843 07/03/20 1846 07/03/20 2014 07/03/20 2058  BP:  (!) 138/93 (!) 142/82   Pulse:  (!) 102 85   Resp:  20 20 18   Temp: 98.4 F (36.9 C)  98.8 F (37.1 C) 98.6 F (37 C)  TempSrc: Oral  Oral Oral  SpO2:  100% 97%   Weight:      Height:        Constitutional: Restless, irritable but answering questions, oriented x 3 . Not in any apparent distress HEENT:      Head: Normocephalic and atraumatic.         Eyes: PERLA, EOMI, Conjunctivae are normal. Sclera is non-icteric.       Mouth/Throat: Mucous membranes are moist.       Neck: Supple with no signs of meningismus. Cardiovascular: Regular rate and rhythm. No murmurs, gallops, or rubs. 2+ symmetrical distal pulses are present . No JVD. No LE edema Respiratory: Respiratory effort normal .Lungs sounds clear bilaterally. No wheezes, crackles, or rhonchi.  Gastrointestinal: Soft, non tender, and non distended with positive bowel sounds.  Genitourinary: No CVA tenderness. Musculoskeletal: Nontender with normal range of motion in all extremities. No cyanosis, or erythema of extremities. Neurologic:  Face is symmetric. Moving all extremities. No gross focal neurologic deficits . Skin: Skin is warm, dry.  No rash or ulcers Psychiatric: anxious and restless       Labs on Admission: I have personally reviewed following labs and imaging studies  CBC: Recent Labs  Lab 07/03/20 1848  WBC 7.2  NEUTROABS 4.2  HGB 11.4*  HCT 32.3*   MCV 82.0  PLT 202   Basic Metabolic Panel: Recent Labs  Lab 07/03/20 1848  NA 134*  K 3.7  CL 103  CO2 21*  GLUCOSE 104*  BUN 8  CREATININE 0.59  CALCIUM 9.0  MG 1.9   GFR: Estimated Creatinine Clearance: 76.2 mL/min (by C-G formula based on SCr of 0.59 mg/dL). Liver Function Tests: Recent Labs  Lab 07/03/20 1848  AST 42*  ALT 39  ALKPHOS 78  BILITOT 0.5  PROT 7.1  ALBUMIN 4.5   No results for input(s): LIPASE, AMYLASE in the last 168 hours. No results for input(s): AMMONIA in the last 168 hours. Coagulation Profile:  No results for input(s): INR, PROTIME in the last 168 hours. Cardiac Enzymes: Recent Labs  Lab 07/03/20 1848  CKTOTAL 93   BNP (last 3 results) No results for input(s): PROBNP in the last 8760 hours. HbA1C: No results for input(s): HGBA1C in the last 72 hours. CBG: No results for input(s): GLUCAP in the last 168 hours. Lipid Profile: No results for input(s): CHOL, HDL, LDLCALC, TRIG, CHOLHDL, LDLDIRECT in the last 72 hours. Thyroid Function Tests: Recent Labs    07/03/20 1848  TSH 2.169   Anemia Panel: No results for input(s): VITAMINB12, FOLATE, FERRITIN, TIBC, IRON, RETICCTPCT in the last 72 hours. Urine analysis:    Component Value Date/Time   COLORURINE AMBER (A) 03/12/2015 1947   APPEARANCEUR HAZY (A) 03/12/2015 1947   APPEARANCEUR Hazy 10/01/2013 2105   LABSPEC 1.033 (H) 03/12/2015 1947   LABSPEC 1.031 10/01/2013 2105   PHURINE 5.0 03/12/2015 1947   GLUCOSEU Negative 11/11/2018 1540   GLUCOSEU Negative 10/01/2013 2105   HGBUR NEGATIVE 03/12/2015 1947   BILIRUBINUR NEGATIVE 03/12/2015 1947   BILIRUBINUR Negative 10/01/2013 2105   KETONESUR TRACE (A) 03/12/2015 1947   PROTEINUR 30 (A) 03/12/2015 1947   NITRITE NEGATIVE 03/12/2015 1947   LEUKOCYTESUR TRACE (A) 03/12/2015 1947   LEUKOCYTESUR Negative 10/01/2013 2105    Radiological Exams on Admission: No results found.   Assessment/Plan 35 year old female with past  history of opioid abuse, currently on Suboxone, nicotine dependence, anxiety, on carbamazepine and hydroxine nightly x 3 months, replaced with on Prozac and Lybalvi on the day of arrival, presenting with acute confusion starting a few hours after taking the first dose of the new medication Lybalvi.      Acute toxic-metabolic encephalopathy   Suspect anticholinergic rxn +/- Acute unintentional opioid antagonism - Patient presents with confusion, restlessness requiring sedation in the ED with IV Benadryl and Versed x2 - Symptoms started within a few hours of taking first dose of a new medication Lybalvi prescribed for anxiety. Had not yet taken Prozac - Suspecting anticholinergic side effects -No stigmata of infection and physical exam without meningismus - Preliminary head CT  - Pharmacy consult to assess for interaction:Per pharmacy "lybalvi contains an opioid antagonist (samidorphan) which can diminish the effects of opioid antagonist and should be avoided in patient taking chronic opioids as the samidorphan can lead to acute opioid withdrawal. Prior to initiating samidorphan, there should be at least a 7-day opioid-free interval from the last use of short-acting opioids, and at least a 14-day opioid-free interval from the last use of long-acting opioids" - Supportive care with IV hydration - As needed benzodiazepines - Consider psych eval in a.m.    History of opioid abuse - on suboxone -on suboxone every two weeks    DVT prophylaxis: Lovenox  Code Status: full code  Family Communication:  Significant other Ethelene Browns  Denigris Disposition Plan: Back to previous home environment Consults called: none  Status: Observation    Andris Baumann MD [facility]     07/03/2020, 9:22 PM

## 2020-07-03 NOTE — ED Triage Notes (Addendum)
Pt arrived via [facility] from home with c/o full body pain and shaking after taking a new medication for anxiety at around 1430 today  Pt unable to remember name of medication at this time, states "I think it starts with an L or Ly-something"  Pt stating "I don't think I'll be able to wait for the doctor" This RN informed pt that a provider will be in to see her shortly while we finish her triage.   Per EMS, VSS. Pt A&Ox4. Pt also takes suboxin  Pt denies SI/HI or any other drug use at this time

## 2020-07-03 NOTE — ED Notes (Signed)
Erratic behavior noted from patient. Patient mumbling under her breath. Disoriented x4. Patient significant other at bedside. Patient grasping at the air and picking at various items. Patient restless and fidgeting. Sitter at bedside. Fall band placed. Fall pads placed in the bed.

## 2020-07-03 NOTE — ED Provider Notes (Signed)
-----------------------------------------   8:47 PM on 07/03/2020 -----------------------------------------  Patient care assumed from Dr. Katrinka Blazing.  I have personally seen and evaluated the patient she remains extremely altered she is confused, she is picking at her close blankets.  Attempting to grab things in the air that are not there.  Patient will talk to you but is mostly incoherent.  Her symptoms very much resemble anticholinergic toxicity.  Patient's husband is now here with whom she lives.  He states no concern for overdose either accidental or intentional.  States the patient did receive 2 new medications today 1 is an SSRI and the other 1 is lybalvi.  The SSRI is unopened and a stapled package.  Patient has not taken that medication yet.  She did take 1 tablet of 10 mg Lybalvi.  In reviewing the adverse reactions of this medication it can cause anticholinergic symptoms as well as EPS symptoms.  Patient is beginning to calm down following administration of benzodiazepines.  However given the patient's altered state she will require admission to the hospital service for further work-up treatment and to ensure that her symptoms resolved with her metabolism of this medication.  Lab work thus far is largely nonrevealing.  EKG viewed and interpreted by myself shows a normal sinus rhythm 87 bpm with a narrow QRS, normal axis, normal intervals, no concerning ST changes.   Minna Antis, MD 07/03/20 2050

## 2020-07-03 NOTE — ED Provider Notes (Addendum)
[facility] Emergency Department Provider Note  ____________________________________________   Event Date/Time   First MD Initiated Contact with Patient 07/03/20 1845     (approximate)  I have reviewed the triage vital signs and the nursing notes.   HISTORY  Chief Complaint No chief complaint on file.   HPI Annette Roberts is a 35 y.o. female with a past medical history of opiate abuse currently on Suboxone, scoliosis, back abuse and depression who presents for assessment of whole body shaking burning sensation that started shortly after she took a new medication for anxiety earlier today.  She does not recall what is called.  He states that prior to taking that she was in her usual state of health without any recent vomiting cough, diarrhea, urinary symptoms, rash, chest pain, abdominal pain, back pain or other feeling of generalized burning shaking her other sick symptoms.  She denies taking any other medications or illicit drugs today.  No EtOH use today.  No prior similar episodes.  She has not taken any medications prior to arrival.         Past Medical History:  Diagnosis Date  . Current smoker   . Depression    does not want anyone incl  FOB to know  . History of preterm premature rupture of membranes (PPROM)    G2  . 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 Marijuana use   . Methadone maintenance treatment affecting pregnancy (HCC)   . Scoliosis     Patient Active Problem List   Diagnosis Date Noted  . Delivery by cesarean section at 37-39 weeks of gestation due to labor 12/01/2018  . Status post repeat low transverse cesarean section 11/29/2018  . Indication for care in labor and delivery, antepartum 10/28/2018  . Pregnancy related nausea and vomiting, antepartum 06/30/2018  . History of preterm delivery, currently pregnant 05/25/2018  . History of cesarean delivery, currently pregnant 05/25/2018  .  History of substance use 04/27/2018  . Supervision of high risk pregnancy, antepartum 04/26/2018  . Hilar lymphadenopathy 05/26/2015  . Tobacco dependence 05/26/2015  . Postpartum care following cesarean delivery 05/22/2015  . Low grade squamous intraepithelial lesion (LGSIL) on cervical Pap smear 2017    Past Surgical History:  Procedure Laterality Date  . CESAREAN SECTION  09/10/2009   FTP/FITL, meconium  . CESAREAN SECTION N/A 05/22/2015   Procedure: CESAREAN SECTION;  Surgeon: Conard Novak, MD;  Location: [facility] ORS;  Service: Obstetrics;  Laterality: N/A;  . CESAREAN SECTION N/A 11/29/2018   Procedure: CESAREAN SECTION;  Surgeon: Conard Novak, MD;  Location: [facility] ORS;  Service: Obstetrics;  Laterality: N/A;    Prior to Admission medications   Medication Sig Start Date End Date Taking? Authorizing Provider  albuterol (VENTOLIN HFA) 108 (90 Base) MCG/ACT inhaler Inhale 1-2 puffs into the lungs every 6 (six) hours as needed for wheezing or shortness of breath. 12/01/18   Farrel Conners, CNM  amoxicillin (AMOXIL) 500 MG capsule Take 1 capsule (500 mg total) by mouth 3 (three) times daily. 02/19/19   Fisher, Roselyn Bering, PA-C  ibuprofen (ADVIL) 800 MG tablet Take 1 tablet (800 mg total) by mouth every 8 (eight) hours as needed. 02/19/19   Sherrie Mustache Roselyn Bering, PA-C  methadone (DOLOPHINE) 10 MG/ML solution Take 95 mg by mouth daily. 11/29/18   [provider]  Prenatal MV & Min w/FA-DHA (PRENATAL ADULT GUMMY/DHA/FA) 0.4-25 MG CHEW Chew 1 Piece by mouth daily. 12/01/18  Farrel Conners, CNM    Allergies Patient has no known allergies.  Family History  Problem Relation Age of Onset  . Diabetes Father   . Hypertension Father   . Heart attack Maternal Grandfather   . Cancer Paternal Grandmother   . Diabetes Nephew   . Heart attack Maternal Aunt   . Heart attack Maternal Uncle     Social History Social History   Tobacco Use  . Smoking status: Current Every Day Smoker     Packs/day: 0.50    Types: Cigarettes  . Smokeless tobacco: Never Used  Vaping Use  . Vaping Use: Never used  Substance Use Topics  . Alcohol use: No    Alcohol/week: 0.0 standard drinks  . Drug use: Yes    Types: Marijuana    Review of Systems  Review of Systems  Constitutional: Negative for chills and fever.  HENT: Negative for sore throat.   Eyes: Negative for pain.  Respiratory: Negative for cough and stridor.   Cardiovascular: Negative for chest pain.  Gastrointestinal: Negative for vomiting.  Genitourinary: Negative for dysuria.  Musculoskeletal: Positive for myalgias.  Skin: Negative for rash.  Neurological: Positive for tremors. Negative for seizures, loss of consciousness and headaches.  Psychiatric/Behavioral: Negative for suicidal ideas.  All other systems reviewed and are negative.     ____________________________________________   PHYSICAL EXAM:  VITAL SIGNS: ED Triage Vitals  Enc Vitals Group     BP --      Pulse --      Resp --      Temp 07/03/20 1843 98.4 F (36.9 C)     Temp Source 07/03/20 1843 Oral     SpO2 --      Weight 07/03/20 1841 125 lb (56.7 kg)     Height 07/03/20 1841 5\' 1"  (1.549 m)     Head Circumference --      Peak Flow --      Pain Score 07/03/20 1841 10     Pain Loc --      Pain Edu? --      Excl. in GC? --    Vitals:   07/03/20 1843 07/03/20 1846  BP:  (!) 138/93  Pulse:  (!) 102  Resp:  20  Temp: 98.4 F (36.9 C)   SpO2:  100%   Physical Exam Vitals and nursing note reviewed.  Constitutional:      General: She is in acute distress.     Appearance: She is well-developed. She is ill-appearing.  HENT:     Head: Normocephalic and atraumatic.     Right Ear: External ear normal.     Left Ear: External ear normal.     Nose: Nose normal.  Eyes:     Conjunctiva/sclera: Conjunctivae normal.  Cardiovascular:     Rate and Rhythm: Regular rhythm. Tachycardia present.     Heart sounds: No murmur  heard.   Pulmonary:     Effort: Pulmonary effort is normal. No respiratory distress.     Breath sounds: Normal breath sounds.  Abdominal:     Palpations: Abdomen is soft.     Tenderness: There is no abdominal tenderness.  Musculoskeletal:     Cervical back: Neck supple.  Skin:    General: Skin is warm and dry.     Capillary Refill: Capillary refill takes less than 2 seconds.  Neurological:     Mental Status: She is alert and oriented to person, place, and time.     Motor: Tremor present.  4+ bilateral patellar reflexes.  There is some clonus present bilaterally.  Pupils are normoactive bilaterally.  No nystagmus.  Skin is dry. ____________________________________________   LABS (all labs ordered are listed, but only abnormal results are displayed)  Labs Reviewed  CBC WITH DIFFERENTIAL/PLATELET - Abnormal; Notable for the following components:      Result Value   Hemoglobin 11.4 (*)    HCT 32.3 (*)    All other components within normal limits  COMPREHENSIVE METABOLIC PANEL - Abnormal; Notable for the following components:   Sodium 134 (*)    CO2 21 (*)    Glucose, Bld 104 (*)    AST 42 (*)    All other components within normal limits  RESP PANEL BY RT-PCR (FLU A&B, COVID) ARPGX2  HCG, QUANTITATIVE, PREGNANCY  MAGNESIUM  ETHANOL  ACETAMINOPHEN LEVEL  SALICYLATE LEVEL  URINE DRUG SCREEN, QUALITATIVE ([facility] ONLY)  PROCALCITONIN  TSH  URINALYSIS, COMPLETE (UACMP) WITH MICROSCOPIC  CK   ____________________________________________  EKG  Sinus rhythm with ventricular of 87, normal axis, unremarkable intervals with significant artifact in the lateral leads.  No other clear evidence of acute ischemia at this time. ____________________________________________  RADIOLOGY  ED MD interpretation:  Official radiology report(s): No results found.  ____________________________________________   PROCEDURES  Procedure(s) performed (including Critical Care):  .1-3  Lead EKG Interpretation Performed by: Gilles Chiquito, MD Authorized by: Gilles Chiquito, MD     Interpretation: normal     ECG rate assessment: tachycardic     Rhythm: sinus bradycardia     Ectopy: none     Conduction: normal     ____________________________________________   INITIAL IMPRESSION / ASSESSMENT AND PLAN / ED COURSE     Patient presents with above-stated history exam for assessment of tremors worsening last couple hours associate with whole body burning sensation started shortly after she took a dose of a new medicine for anxiety.  She does not recall the name of this.  On arrival she is tachycardic at 102 with otherwise stable vital signs on room air.  She is very tremulous and somewhat difficult to redirect rolling around in bed.  Differential includes serotonin syndrome from recently started medication, akathisia, metabolic derangements, sympathomimetic ingestion, acute infectious process.  I will was able to reach patient's pharmacy where she had her medicine filled and they stated she had a medication filled for fluoxetine earlier today.  No other recent medications filled.  While undergoing initial evaluation emergency room on assessment after approximately 40 minutes patient is much more confused than she was on arrival.  She continues to be quite hyperactive running back-and-forth on her bed taking her IV lines.  She seems to be possibly hallucinating as well my reassessment.  Given onset seem to occur a few hours after startingwith presentation including hyper activity, hyperreflexia and tachycardia and concern for possible serotonin syndrome vs other toxidrome vs side effect from new medication.  No exam findings or history of recent trauma.  Lower suspicion for acute infectious causes patient denies any recent sick symptoms and has no fever or leukocytosis does not appear meningitic on arrival or on subsequent exam.  CBC has otherwise no acute anemia or other  abnormalities.  CMP shows no significant electrode or metabolic derangements.  Magnesium is WNL.  We will also obtain a CT head to rule out acute intracranial abnormalities.  We will send for TSH CK ethanol salicylate acetaminophen level as well.   Treated on arrival with 50 mg of Benadryl and  2 of Versed.  As this had minimal effect after 30 minutes will be dosed 2 of Versed we will anticipate patient may need additional sedation and benzodiazepines if she does not respond to this.  Patient transferred to higher acute or upon approximately 8 PM and care taken over by Dr. Tyrone ApplePaduchowsk.      ____________________________________________   FINAL CLINICAL IMPRESSION(S) / ED DIAGNOSES  Final diagnoses:  Myalgia  Akathisia  Altered mental status, unspecified altered mental status type  Tremor    Medications  diphenhydrAMINE (BENADRYL) injection 50 mg (50 mg Intravenous Given 07/03/20 1855)  midazolam (VERSED) injection 1 mg (1 mg Intravenous Given 07/03/20 1856)  lactated ringers bolus 1,000 mL (1,000 mLs Intravenous New Bag/Given 07/03/20 1923)  midazolam (VERSED) injection 1 mg (1 mg Intravenous Given 07/03/20 1923)  midazolam (VERSED) injection 2 mg (2 mg Intravenous Given 07/03/20 1941)     ED Discharge Orders    None       Note:  This document was prepared using Dragon voice recognition software and may include unintentional dictation errors.   Gilles ChiquitoSmith, Teagen Mcleary P, MD 07/03/20 2000    Gilles ChiquitoSmith, Kevin Mario P, MD 07/03/20 2252

## 2020-07-04 DIAGNOSIS — F1111 Opioid abuse, in remission: Secondary | ICD-10-CM

## 2020-07-04 DIAGNOSIS — T50905S Adverse effect of unspecified drugs, medicaments and biological substances, sequela: Secondary | ICD-10-CM

## 2020-07-04 DIAGNOSIS — E871 Hypo-osmolality and hyponatremia: Secondary | ICD-10-CM

## 2020-07-04 DIAGNOSIS — G9341 Metabolic encephalopathy: Secondary | ICD-10-CM | POA: Diagnosis not present

## 2020-07-04 MED ORDER — BUPRENORPHINE HCL-NALOXONE HCL 8-2 MG SL FILM: 1.0000 | ORAL_FILM | Freq: Two times a day (BID) | SUBLINGUAL | Status: AC

## 2020-07-04 MED ORDER — DIPHENHYDRAMINE HCL 50 MG/ML IJ SOLN
12.5000 mg | Freq: Once | INTRAMUSCULAR | Status: AC
  Administered 2020-07-04: 12.5 mg via INTRAVENOUS
  Filled 2020-07-04: qty 1

## 2020-07-04 NOTE — Discharge Instructions (Signed)
Delirium °Delirium is a state of mental confusion. It comes on quickly and causes significant changes in a person's thinking and behavior. °People with delirium usually have trouble paying attention to what is going on or knowing where they are. They may become very withdrawn or very emotional and unable to sit still. They may even see or feel things that are not there (hallucinations). Delirium is a sign of a serious underlying medical condition. °What are the causes? °Delirium occurs when something suddenly affects the signals that the brain sends out. Brain signals can be affected by anything that puts severe stress on the body and brain and causes brain chemicals to be out of balance. The most common causes of delirium include: °· Infections. These may be bacterial, viral, fungal, or protozoal. °· Medicines. These include many over-the-counter and prescription medicines. °· Recreational drugs. °· Substance withdrawal. This occurs with sudden discontinuation of alcohol, certain medicines, or recreational drugs. °· Surgery and anesthesia. °· Sudden vascular events, such as stroke and brain hemorrhage. °· Other brain disorders, such as migraines, tumors, seizures, and physical head trauma. °· Metabolic disorders, such as kidney or liver failure. °· Low blood oxygen (anoxia). This may occur with lung disease, cardiac arrest, or carbon monoxide poisoning. °· Hormone imbalances (endocrinopathies), such as an overactive thyroid (hyperthyroidism) or underactive thyroid (hypothyroidism). °· Vitamin deficiencies. °What increases the risk? °The following factors may make someone more likely to develop this condition: °· Being a child. °· Being an older person. °· Living alone. °· Having vision loss or hearing loss. °· Having an existing brain disease, such as dementia. °· Having long-lasting (chronic) medical conditions, such as heart disease. °· Being hospitalized for long periods of time. °What are the signs or  symptoms? °Delirium starts with a sudden change in a person's thinking or behavior. Symptoms include: °· Not being able to stay awake (drowsiness) or pay attention. °· Being confused about places, time, and people. °· Forgetfulness. °· Having extreme energy levels. These may be low or high. °· Changes in sleep patterns. °· Extreme mood swings, such as sudden anger or anxiety. °· Focusing on things or ideas that are not important. °· Rambling and senseless talking. °· Difficulty speaking, understanding speech, or both. °· Hallucinations. °· Tremor or unsteady gait. °Symptoms come and go throughout the day and are often worse at the end of the day. °How is this diagnosed? °People with delirium may not realize that they have the condition. Often, a family member or health care provider is the first person to notice the changes. This condition may be diagnosed based on a physical exam, health history, and tests. °· The health care provider will obtain a detailed history. This may include questions about: °? Current symptoms. °? Medical conditions that you have. °? Medicines. °? Drug use. °· The health care provider will perform a mental status test by: °? Asking questions to check for confusion. °? Watching for abnormal behavior. °· The health care provider may also order lab tests or additional studies to determine the cause of the delirium. °How is this treated? °Treatment of delirium depends on the cause and severity. Delirium usually goes away within days or weeks of treating the underlying cause. In the meantime, do not leave the person alone because he or she may accidentally cause self-harm. This condition may be treated with supportive care, such as: °· Increased light during the day and decreased light at night. °· Low noise level. °· Uninterrupted sleep. °· A regular daily   schedule. °· Clocks and calendars to help with orientation. °· Familiar objects, including the person's pictures and clothing. °· Frequent  visits from familiar family and friends. °· A healthy diet. °· Gentle exercise. °In more severe cases of delirium, medicine may be prescribed to help the person keep calm and think more clearly.   °Follow these instructions at home: °· Continue supportive care as told by a health care provider. °· Take over-the-counter and prescription medicines only as told by your health care provider. °· Ask a health care provider before using herbs or supplements. °· Do not use alcohol or illegal drugs. °· Keep all follow-up visits. This is important.   °Contact a health care provider if: °· Symptoms do not get better or they become worse. °· New symptoms of delirium develop. °· Caring for the person at home does not seem safe. °· Eating, drinking, or communicating stops. °· There are side effects of medicines, such as changes in sleep patterns, dizziness, weight gain, restlessness, movement changes, or tremors. °Get help right away if: °· The person has thoughts of harming self or harming others. °· There are serious side effects of medicine, such as: °? Swelling of the face, lips, tongue, or throat. °? Fever, confusion, muscle spasms, or seizures. °If you ever feel like a loved one may hurt himself or herself or others, or shares thoughts about taking his or her own life, get help right away. You can go to your nearest emergency department or: °· Call your local emergency services (911 in the [place] °· Call a suicide crisis helpline, such as the National Suicide Prevention Lifeline at 1-800-273-8255. This is open 24 hours a day in the [place] °· Text the Crisis Text Line at 741741 (in the [place] °Summary °· Delirium is a state of mental confusion. It comes on quickly and causes significant changes in a person's thinking and behavior. °· Delirium is a sign of a serious underlying medical condition. °· Certain medical conditions or a long hospital stay may increase the risk of developing delirium. °· Treatment of delirium involves  treating the underlying cause and providing supportive treatments, such as a calm and familiar environment. °This information is not intended to replace advice given to you by your health care provider. Make sure you discuss any questions you have with your health care provider. °Document Revised: 05/19/2019 Document Reviewed: 05/19/2019 °Elsevier Patient Education © 2021 [employer] ° °

## 2020-07-04 NOTE — Discharge Summary (Signed)
[facility] [place] [facility]   PATIENT NAME: Annette Roberts    MR#:  [id number]  DATE OF BIRTH:  1985/12/10  DATE OF ADMISSION:  07/03/2020 ADMITTING PHYSICIAN: Andris Baumann, MD  DATE OF DISCHARGE: 07/04/2020  1:02 PM  PRIMARY CARE PHYSICIAN: Nira Retort    ADMISSION DIAGNOSIS:  Myalgia [M79.10] Tremor [R25.1] Acute confusion [R41.0] Akathisia [G25.71] Altered mental status, unspecified altered mental status type [R41.82]  DISCHARGE DIAGNOSIS:  Principal Problem:   Acute toxic-metabolic encephalopathy Active Problems:   Medication adverse effect, initial encounter   History of opioid abuse - on suboxone (HCC)   SECONDARY DIAGNOSIS:   Past Medical History:  Diagnosis Date  . Current smoker   . Depression    does not want anyone incl  FOB to know  . History of preterm premature rupture of membranes (PPROM)    G2  . 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 Marijuana use   . Methadone maintenance treatment affecting pregnancy (HCC)   . Scoliosis     HOSPITAL COURSE:   1.  Acute toxic metabolic encephalopathy.  Reaction after taking the medication.  The patient took 1 dose of libalvi and she felt a buzzing feeling at her mouth had a taste change.  She was yawning a lot and got very tired.  Her legs were wobbly.  She had pains in all of her muscles and she was restless.  She was admitted as an observation.  Patient was feeling better and was ready to go home on 07/04/2020.  She will stop the 2 new medications Luvox and libalvi.  She states she wants to go back on her old medication the carbamazepine.  She follows up at [facility].  No suicidal or homicidal ideation. 2.  History of substance abuse on Suboxone.  Patient will go back on this as outpatient.  I added it to her medication reconciliation because she takes this at home.  I did not write a prescription for this. 3.  Hyponatremia this was only one-point  lower than the normal range.  No further work-up.  Follow-up as outpatient.  DISCHARGE CONDITIONS:   Satisfactory  CONSULTS OBTAINED:  None  DRUG ALLERGIES:  No Known Allergies  DISCHARGE MEDICATIONS:   Allergies as of 07/04/2020   No Known Allergies     Medication List    STOP taking these medications   fluvoxaMINE 100 MG tablet Commonly known as: LUVOX   Lybalvi 10-10 MG Tabs Generic drug: OLANZapine-Samidorphan     TAKE these medications   Buprenorphine HCl-Naloxone HCl 8-2 MG Film Commonly known as: Suboxone Place 1 Film under the tongue in the morning and at bedtime.        DISCHARGE INSTRUCTIONS:   Follow-up with [facility] as outpatient  If you experience worsening of your admission symptoms, develop shortness of breath, life threatening emergency, suicidal or homicidal thoughts you must seek medical attention immediately by calling 911 or calling your MD immediately  if symptoms less severe.  You Must read complete instructions/literature along with all the possible adverse reactions/side effects for all the Medicines you take and that have been prescribed to you. Take any new Medicines after you have completely understood and accept all the possible adverse reactions/side effects.   Please note  You were cared for by a hospitalist during your hospital stay. If you have any questions about your discharge medications or the care you received while you were in the hospital after  you are discharged, you can call the unit and asked to speak with the hospitalist on call if the hospitalist that took care of you is not available. Once you are discharged, your primary care physician will handle any further medical issues. Please note that NO REFILLS for any discharge medications will be authorized once you are discharged, as it is imperative that you return to your primary care physician (or establish a relationship with a primary care physician if you do not have one) for your  aftercare needs so that they can reassess your need for medications and monitor your lab values.    Today   CHIEF COMPLAINT:  Came in with altered mental status after taking a new medication  HISTORY OF PRESENT ILLNESS:  Annette Roberts  is a 35 y.o. female with a known history of    VITAL SIGNS:  Blood pressure 119/75, pulse 68, temperature 98.7 F (37.1 C), temperature source Oral, resp. rate 16, height 5\' 1"  (1.549 m), weight 56.7 kg, last menstrual period 06/12/2020, SpO2 99 %, unknown if currently breastfeeding.  I/O:    Intake/Output Summary (Last 24 hours) at 07/04/2020 1603 Last data filed at 07/04/2020 0940 Gross per 24 hour  Intake 1240 ml  Output --  Net 1240 ml    PHYSICAL EXAMINATION:  GENERAL:  35 y.o.-year-old patient lying in the bed with no acute distress.  EYES: Pupils equal, round, reactive to light and accommodation. No scleral icterus. Extraocular muscles intact.  HEENT: Head atraumatic, normocephalic. Oropharynx and nasopharynx clear.  LUNGS: Normal breath sounds bilaterally, no wheezing, rales,rhonchi or crepitation. No use of accessory muscles of respiration.  CARDIOVASCULAR: S1, S2 normal. No murmurs, rubs, or gallops.  ABDOMEN: Soft, non-tender, non-distended. Bowel sounds present. No organomegaly or mass.  EXTREMITIES: No pedal edema, cyanosis, or clubbing.  NEUROLOGIC: Cranial nerves II through XII are intact. Muscle strength 5/5 in all extremities. Sensation intact. Gait not checked.  PSYCHIATRIC: The patient is alert and oriented x 3.  SKIN: No obvious rash, lesion, or ulcer.   DATA REVIEW:   CBC Recent Labs  Lab 07/03/20 1848  WBC 7.2  HGB 11.4*  HCT 32.3*  PLT 202    Chemistries  Recent Labs  Lab 07/03/20 1848  NA 134*  K 3.7  CL 103  CO2 21*  GLUCOSE 104*  BUN 8  CREATININE 0.59  CALCIUM 9.0  MG 1.9  AST 42*  ALT 39  ALKPHOS 78  BILITOT 0.5     Microbiology Results  Results for orders placed or performed during the  hospital encounter of 07/03/20  Resp Panel by RT-PCR (Flu A&B, Covid) Nasopharyngeal Swab     Status: None   Collection Time: 07/03/20  9:00 PM   Specimen: Nasopharyngeal Swab; Nasopharyngeal(NP) swabs in vial transport medium  Result Value Ref Range Status   SARS Coronavirus 2 by RT PCR NEGATIVE NEGATIVE Final    Comment: (NOTE) SARS-CoV-2 target nucleic acids are NOT DETECTED.  The SARS-CoV-2 RNA is generally detectable in upper respiratory specimens during the acute phase of infection. The lowest concentration of SARS-CoV-2 viral copies this assay can detect is 138 copies/mL. A negative result does not preclude SARS-Cov-2 infection and should not be used as the sole basis for treatment or other patient management decisions. A negative result may occur with  improper specimen collection/handling, submission of specimen other than nasopharyngeal swab, presence of viral mutation(s) within the areas targeted by this assay, and inadequate number of viral copies(<138 copies/mL). A negative result  must be combined with clinical observations, patient history, and epidemiological information. The expected result is Negative.  Fact Sheet for Patients:  BloggerCourse.com  Fact Sheet for Healthcare Providers:  SeriousBroker.it  This test is no t yet approved or cleared by the [place] FDA and  has been authorized for detection and/or diagnosis of SARS-CoV-2 by FDA under an Emergency Use Authorization (EUA). This EUA will remain  in effect (meaning this test can be used) for the duration of the COVID-19 declaration under Section 564(b)(1) of the Act, 21 U.S.C.section 360bbb-3(b)(1), unless the authorization is terminated  or revoked sooner.       Influenza A by PCR NEGATIVE NEGATIVE Final   Influenza B by PCR NEGATIVE NEGATIVE Final    Comment: (NOTE) The Xpert Xpress SARS-CoV-2/FLU/RSV plus assay is intended as an aid in the  diagnosis of influenza from Nasopharyngeal swab specimens and should not be used as a sole basis for treatment. Nasal washings and aspirates are unacceptable for Xpert Xpress SARS-CoV-2/FLU/RSV testing.  Fact Sheet for Patients: BloggerCourse.com  Fact Sheet for Healthcare Providers: SeriousBroker.it  This test is not yet approved or cleared by the [place] FDA and has been authorized for detection and/or diagnosis of SARS-CoV-2 by FDA under an Emergency Use Authorization (EUA). This EUA will remain in effect (meaning this test can be used) for the duration of the COVID-19 declaration under Section 564(b)(1) of the Act, 21 U.S.C. section 360bbb-3(b)(1), unless the authorization is terminated or revoked.  Performed at [facility], [address], [place], [place] [address]     RADIOLOGY:  CT Head Wo Contrast  Result Date: 07/03/2020 CLINICAL DATA:  Mental status change.  Unknown cause. EXAM: CT HEAD WITHOUT CONTRAST TECHNIQUE: Contiguous axial images were obtained from the base of the skull through the vertex without intravenous contrast. COMPARISON:  None. FINDINGS: Brain: No evidence of acute infarction, hemorrhage, hydrocephalus, extra-axial collection or mass lesion/mass effect. Vascular: No hyperdense vessel or unexpected calcification. Skull: Normal. Negative for fracture or focal lesion. Sinuses/Orbits: Occasional mucosal thickening of ethmoid air cells. Mastoid air cells are clear. The visualized orbits are unremarkable. Other: None. IMPRESSION: Negative noncontrast head CT. Electronically Signed   By: Narda Rutherford M.D.   On: 07/03/2020 21:48    Management plans discussed with the patient, and she is in agreement.  Patient deferred me calling family.  CODE STATUS:     Code Status Orders  (From admission, onward)         Start     Ordered   07/03/20 2201  Full code  Continuous        07/03/20 2204         Code Status History    Date Active Date Inactive Code Status Order ID Comments User Context   11/29/2018 0604 12/02/2018 0222 Full Code [id number]  Conard Novak, MD Inpatient   11/29/2018 0157 11/29/2018 0537 Full Code [id number]  Farrel Conners, CNM Inpatient   10/28/2018 1704 10/28/2018 2152 Full Code [id number]  Oswaldo Conroy, CNM Inpatient   05/22/2015 1953 05/23/2015 0136 Full Code [id number]  Conard Novak, MD Inpatient   03/12/2015 1945 03/13/2015 0042 Full Code [id number]  Nadara Mustard, MD Inpatient   Advance Care Planning Activity      TOTAL TIME TAKING CARE OF THIS PATIENT: 35 minutes.    Alford Highland M.D on 07/04/2020 at 4:03 PM  Between 7am to 6pm - Pager - [phone]  After 6pm go to [email or web address] - password [email or web address]  [facility]  CC: Primary care physician; [facility]

## 2020-09-07 ENCOUNTER — Other Ambulatory Visit: Payer: Self-pay

## 2020-09-07 ENCOUNTER — Emergency Department
Admission: EM | Admit: 2020-09-07 | Discharge: 2020-09-07 | Disposition: A | Discharge status: Home / Self Care | Payer: Medicaid Other | Attending: Emergency Medicine | Admitting: Emergency Medicine

## 2020-09-07 ENCOUNTER — Encounter: Payer: Self-pay | Admitting: Emergency Medicine

## 2020-09-07 DIAGNOSIS — F17210 Nicotine dependence, cigarettes, uncomplicated: Secondary | ICD-10-CM | POA: Insufficient documentation

## 2020-09-07 DIAGNOSIS — N939 Abnormal uterine and vaginal bleeding, unspecified: Secondary | ICD-10-CM | POA: Diagnosis present

## 2020-09-07 LAB — CBC
HCT: 36.5 % (ref 36.0–46.0)
Hemoglobin: 12.1 g/dL (ref 12.0–15.0)
MCH: 29.4 pg (ref 26.0–34.0)
MCHC: 33.2 g/dL (ref 30.0–36.0)
MCV: 88.6 fL (ref 80.0–100.0)
Platelets: 248 K/uL (ref 150–400)
RBC: 4.12 MIL/uL (ref 3.87–5.11)
RDW: 13.3 % (ref 11.5–15.5)
WBC: 7.3 K/uL (ref 4.0–10.5)
nRBC: 0 % (ref 0.0–0.2)

## 2020-09-07 LAB — BASIC METABOLIC PANEL
Anion gap: 6 (ref 5–15)
BUN: 7 mg/dL (ref 6–20)
CO2: 28 mmol/L (ref 22–32)
Calcium: 9 mg/dL (ref 8.9–10.3)
Chloride: 106 mmol/L (ref 98–111)
Creatinine, Ser: 0.57 mg/dL (ref 0.44–1.00)
GFR, Estimated: >60 mL/min (ref >60)
Glucose, Bld: 198 mg/dL — ABNORMAL HIGH (ref 70–99)
Potassium: 3.6 mmol/L (ref 3.5–5.1)
Sodium: 140 mmol/L (ref 135–145)

## 2020-09-07 LAB — HCG, QUANTITATIVE, PREGNANCY: hCG, Beta Chain, Quant, S: <1 m[IU]/mL (ref <5)

## 2020-09-07 LAB — POC URINE PREG, ED: Preg Test, Ur: NEGATIVE

## 2020-09-07 NOTE — ED Triage Notes (Signed)
Pt via POV from home. Pt states she found 4 days ago she was pregnant and 2 days ago she began to have vaginal bleeding, pt states its not too heavy but blood is bright red. This is pt's 4th pregnancy. Also c/o abd cramping. Pt's LMP 6/3. Pt is A&Ox4 and NAD.

## 2020-09-07 NOTE — Discharge Instructions (Addendum)
Please make a follow up appointment with your obgyn.

## 2020-09-07 NOTE — ED Provider Notes (Signed)
[facility]-EMERGENCY DEPARTMENT  ____________________________________________  Time seen: Approximately 7:57 PM  I have reviewed the triage vital signs and the nursing notes.   HISTORY  Chief Complaint Vaginal Bleeding   Historian Patient     HPI Annette Roberts is a 35 y.o. female presents to the emergency department with vaginal bleeding and possible pregnancy.  Patient reports that she took a pregnancy test 4 days ago and it was positive.  She states that she has been having some mild cramping and vaginal bleeding today.  She denies abdominal pain or back pain.  No fever or chills at home.  No dysuria, hematuria or increased urinary frequency.  Patient denies chest pain, chest tightness and shortness of breath.  She is O positive.   Past Medical History:  Diagnosis Date   Current smoker    Depression    does not want anyone incl  FOB to know   History of preterm premature rupture of membranes (PPROM)    G2   Low grade squamous intraepithelial lesion (LGSIL) on cervical Pap smear 2017   Marijuana use    Methadone maintenance treatment affecting pregnancy (HCC)    Scoliosis      Immunizations up to date:  Yes.     Past Medical History:  Diagnosis Date   Current smoker    Depression    does not want anyone incl  FOB to know   History of preterm premature rupture of membranes (PPROM)    G2   Low grade squamous intraepithelial lesion (LGSIL) on cervical Pap smear 2017   Marijuana use    Methadone maintenance treatment affecting pregnancy (HCC)    Scoliosis     Patient Active Problem List   Diagnosis Date Noted   Hyponatremia    Drug reaction 07/03/2020   History of opioid abuse - on suboxone (HCC) 07/03/2020   Acute toxic-metabolic encephalopathy 07/03/2020   Delivery by cesarean section at 37-39 weeks of gestation due to labor 12/01/2018   Status post repeat low transverse cesarean section 11/29/2018   Indication for care in labor and delivery, antepartum  10/28/2018   Pregnancy related nausea and vomiting, antepartum 06/30/2018   History of preterm delivery, currently pregnant 05/25/2018   History of cesarean delivery, currently pregnant 05/25/2018   History of substance use 04/27/2018   Supervision of high risk pregnancy, antepartum 04/26/2018   Hilar lymphadenopathy 05/26/2015   Tobacco dependence 05/26/2015   Postpartum care following cesarean delivery 05/22/2015   Low grade squamous intraepithelial lesion (LGSIL) on cervical Pap smear 2017    Past Surgical History:  Procedure Laterality Date   CESAREAN SECTION  09/10/2009   FTP/FITL, meconium   CESAREAN SECTION N/A 05/22/2015   Procedure: CESAREAN SECTION;  Surgeon: Conard Novak, MD;  Location: [facility] ORS;  Service: Obstetrics;  Laterality: N/A;   CESAREAN SECTION N/A 11/29/2018   Procedure: CESAREAN SECTION;  Surgeon: Conard Novak, MD;  Location: [facility] ORS;  Service: Obstetrics;  Laterality: N/A;    Prior to Admission medications   Medication Sig Start Date End Date Taking? Authorizing Provider  Buprenorphine HCl-Naloxone HCl (SUBOXONE) 8-2 MG FILM Place 1 Film under the tongue in the morning and at bedtime. 07/04/20   Alford Highland, MD    Allergies Patient has no known allergies.  Family History  Problem Relation Age of Onset   Diabetes Father    Hypertension Father    Heart attack Maternal Grandfather    Cancer Paternal Grandmother    Diabetes Nephew  Heart attack Maternal Aunt    Heart attack Maternal Uncle     Social History Social History   Tobacco Use   Smoking status: Every Day    Packs/day: 0.50    Types: Cigarettes   Smokeless tobacco: Never  Vaping Use   Vaping Use: Never used  Substance Use Topics   Alcohol use: No    Alcohol/week: 0.0 standard drinks   Drug use: Yes    Types: Marijuana     Review of Systems  Constitutional: No fever/chills Eyes:  No discharge ENT: No upper respiratory complaints. Respiratory: no cough. No SOB/  use of accessory muscles to breath Gastrointestinal:   No nausea, no vomiting.  No diarrhea.  No constipation. Genitourinary: Patient has vaginal bleeding.  Musculoskeletal: Negative for musculoskeletal pain. Skin: Negative for rash, abrasions, lacerations, ecchymosis.   ____________________________________________   PHYSICAL EXAM:  VITAL SIGNS: ED Triage Vitals  Enc Vitals Group     BP 09/07/20 1813 (!) 155/96     Pulse Rate 09/07/20 1813 73     Resp 09/07/20 1813 20     Temp 09/07/20 1813 98.3 F (36.8 C)     Temp Source 09/07/20 1813 Oral     SpO2 09/07/20 1813 100 %     Weight 09/07/20 1814 127 lb (57.6 kg)     Height 09/07/20 1814 5\' 1"  (1.549 m)     Head Circumference --      Peak Flow --      Pain Score 09/07/20 1814 0     Pain Loc --      Pain Edu? --      Excl. in GC? --      Constitutional: Alert and oriented. Well appearing and in no acute distress. Eyes: Conjunctivae are normal. PERRL. EOMI. Head: Atraumatic. ENT:      Nose: No congestion/rhinnorhea.      Mouth/Throat: Mucous membranes are moist.  Neck: No stridor.  No cervical spine tenderness to palpation. Cardiovascular: Normal rate, regular rhythm. Normal S1 and S2.  Good peripheral circulation. Respiratory: Normal respiratory effort without tachypnea or retractions. Lungs CTAB. Good air entry to the bases with no decreased or absent breath sounds Gastrointestinal: Bowel sounds x 4 quadrants. Soft and nontender to palpation. No guarding or rigidity. No distention. Musculoskeletal: Full range of motion to all extremities. No obvious deformities noted Neurologic:  Normal for age. No gross focal neurologic deficits are appreciated.  Skin:  Skin is warm, dry and intact. No rash noted. Psychiatric: Mood and affect are normal for age. Speech and behavior are normal.   ____________________________________________   LABS (all labs ordered are listed, but only abnormal results are displayed)  Labs Reviewed   BASIC METABOLIC PANEL - Abnormal; Notable for the following components:      Result Value   Glucose, Bld 198 (*)    All other components within normal limits  CBC  HCG, QUANTITATIVE, PREGNANCY  POC URINE PREG, ED   ____________________________________________  EKG   ____________________________________________  RADIOLOGY   No results found.  ____________________________________________    PROCEDURES  Procedure(s) performed:     Procedures     Medications - No data to display   ____________________________________________   INITIAL IMPRESSION / ASSESSMENT AND PLAN / ED COURSE  Pertinent labs & imaging results that were available during my care of the patient were reviewed by me and considered in my medical decision making (see chart for details).      Assessment and plan Vaginal bleeding 35 year old  female presents to the emergency department with concern for vaginal bleeding and possible pregnancy.  Patient was hypertensive at triage but vital signs were otherwise reassuring.  Physical exam, patient was alert, active and nontoxic-appearing.  Abdomen was soft and nontender without guarding.  Patient's urine pregnancy test was negative and beta-hCG was less than 1.  H&H were within reference range.  Communicated negative pregnancy status to patient.  She denies significant pelvic pain or abdominal pain.  Recommended following up with OB/GYN for contraceptive counseling.  Return precautions were given to return with new or worsening symptoms.     ____________________________________________  FINAL CLINICAL IMPRESSION(S) / ED DIAGNOSES  Final diagnoses:  Vaginal bleeding      NEW MEDICATIONS STARTED DURING THIS VISIT:  ED Discharge Orders     None           This chart was dictated using voice recognition software/Dragon. Despite best efforts to proofread, errors can occur which can change the meaning. Any change was purely  unintentional.     Gasper Lloyd 09/07/20 Jamal Collin, MD 09/07/20 2221

## 2020-09-07 NOTE — ED Notes (Signed)
Pt given drink and assisted to toilet.

## 2020-11-03 ENCOUNTER — Other Ambulatory Visit: Payer: Self-pay

## 2020-11-03 ENCOUNTER — Emergency Department
Admission: EM | Admit: 2020-11-03 | Discharge: 2020-11-03 | Disposition: A | Discharge status: Home / Self Care | Payer: Medicaid Other | Attending: Emergency Medicine | Admitting: Emergency Medicine

## 2020-11-03 DIAGNOSIS — R519 Headache, unspecified: Secondary | ICD-10-CM | POA: Insufficient documentation

## 2020-11-03 DIAGNOSIS — F17210 Nicotine dependence, cigarettes, uncomplicated: Secondary | ICD-10-CM | POA: Insufficient documentation

## 2020-11-03 DIAGNOSIS — K047 Periapical abscess without sinus: Secondary | ICD-10-CM | POA: Insufficient documentation

## 2020-11-03 DIAGNOSIS — K0889 Other specified disorders of teeth and supporting structures: Secondary | ICD-10-CM | POA: Diagnosis present

## 2020-11-03 DIAGNOSIS — K029 Dental caries, unspecified: Secondary | ICD-10-CM | POA: Diagnosis not present

## 2020-11-03 MED ORDER — KETOROLAC TROMETHAMINE 30 MG/ML IJ SOLN
30.0000 mg | Freq: Once | INTRAMUSCULAR | Status: AC
  Administered 2020-11-03: 30 mg via INTRAMUSCULAR

## 2020-11-03 MED ORDER — KETOROLAC TROMETHAMINE 30 MG/ML IJ SOLN
30.0000 mg | Freq: Once | INTRAMUSCULAR | Status: DC
  Filled 2020-11-03: qty 1

## 2020-11-03 MED ORDER — IBUPROFEN 600 MG PO TABS: 600.0000 mg | ORAL_TABLET | Freq: Three times a day (TID) | ORAL | 0 refills | Status: DC | PRN

## 2020-11-03 MED ORDER — KETOROLAC TROMETHAMINE 30 MG/ML IJ SOLN: 30.0000 mg | Freq: Once | INTRAMUSCULAR | Status: DC

## 2020-11-03 MED ORDER — AMOXICILLIN 500 MG PO CAPS: 500.0000 mg | ORAL_CAPSULE | Freq: Three times a day (TID) | ORAL | 0 refills | Status: DC

## 2020-11-03 MED ORDER — KETOROLAC TROMETHAMINE 60 MG/2ML IM SOLN: 60.0000 mg | Freq: Once | INTRAMUSCULAR | Status: DC

## 2020-11-03 NOTE — ED Provider Notes (Signed)
[facility] Emergency Department Provider Note ____________________________________________  Time seen: 1100  I have reviewed the triage vital signs and the nursing notes.  HISTORY  Chief Complaint  Dental Pain   HPI Annette Roberts is a 35 y.o. female presents to the ER today with complaint of dental pain.  She reports she has severe dental issues that she had been working with a Education officer, community however her dentist no longer takes her Medicaid.  She reports she can taste the infection in her mouth.  She reports throbbing pain that worsens when she tries to chew.  She reports associated headache.  She has not noticed any facial swelling.  She denies fever, chills, nausea or vomiting.  She has tried Tylenol OTC with minimal relief of symptoms.  She is currently on Suboxone.  Past Medical History:  Diagnosis Date   Current smoker    Depression    does not want anyone incl  FOB to know   History of preterm premature rupture of membranes (PPROM)    G2   Low grade squamous intraepithelial lesion (LGSIL) on cervical Pap smear 2017   Marijuana use    Methadone maintenance treatment affecting pregnancy (HCC)    Scoliosis     Patient Active Problem List   Diagnosis Date Noted   Hyponatremia    Drug reaction 07/03/2020   History of opioid abuse - on suboxone (HCC) 07/03/2020   Acute toxic-metabolic encephalopathy 07/03/2020   Delivery by cesarean section at 37-39 weeks of gestation due to labor 12/01/2018   Status post repeat low transverse cesarean section 11/29/2018   Indication for care in labor and delivery, antepartum 10/28/2018   Pregnancy related nausea and vomiting, antepartum 06/30/2018   History of preterm delivery, currently pregnant 05/25/2018   History of cesarean delivery, currently pregnant 05/25/2018   History of substance use 04/27/2018   Supervision of high risk pregnancy, antepartum 04/26/2018   Hilar lymphadenopathy 05/26/2015   Tobacco dependence  05/26/2015   Postpartum care following cesarean delivery 05/22/2015   Low grade squamous intraepithelial lesion (LGSIL) on cervical Pap smear 2017    Past Surgical History:  Procedure Laterality Date   CESAREAN SECTION  09/10/2009   FTP/FITL, meconium   CESAREAN SECTION N/A 05/22/2015   Procedure: CESAREAN SECTION;  Surgeon: Conard Novak, MD;  Location: [facility] ORS;  Service: Obstetrics;  Laterality: N/A;   CESAREAN SECTION N/A 11/29/2018   Procedure: CESAREAN SECTION;  Surgeon: Conard Novak, MD;  Location: [facility] ORS;  Service: Obstetrics;  Laterality: N/A;    Prior to Admission medications   Medication Sig Start Date End Date Taking? Authorizing Provider  amoxicillin (AMOXIL) 500 MG capsule Take 1 capsule (500 mg total) by mouth 3 (three) times daily. 11/03/20  Yes Gladys Deckard, Salvadore Oxford, NP  ibuprofen (ADVIL) 600 MG tablet Take 1 tablet (600 mg total) by mouth every 8 (eight) hours as needed. 11/03/20  Yes Olean Sangster, Salvadore Oxford, NP  Buprenorphine HCl-Naloxone HCl (SUBOXONE) 8-2 MG FILM Place 1 Film under the tongue in the morning and at bedtime. 07/04/20   Alford Highland, MD    Allergies Patient has no known allergies.  Family History  Problem Relation Age of Onset   Diabetes Father    Hypertension Father    Heart attack Maternal Grandfather    Cancer Paternal Grandmother    Diabetes Nephew    Heart attack Maternal Aunt    Heart attack Maternal Uncle     Social History Social History   Tobacco  Use   Smoking status: Every Day    Packs/day: 0.50    Types: Cigarettes   Smokeless tobacco: Never  Vaping Use   Vaping Use: Never used  Substance Use Topics   Alcohol use: No    Alcohol/week: 0.0 standard drinks   Drug use: Yes    Types: Marijuana    Review of Systems  Constitutional: Negative for fever, chills or body aches. ENT: Positive for dental pain.  Negative for sore throat. Cardiovascular: Negative for chest pain or chest tightness. Respiratory: Negative for cough or  shortness of breath. Skin: Negative for rash. Neurological: Positive for headache.  Negative for dizziness, focal weakness, tingling or numbness. ____________________________________________  PHYSICAL EXAM:  VITAL SIGNS: ED Triage Vitals [11/03/20 0958]  Enc Vitals Group     BP (!) 134/107     Pulse Rate 77     Resp 18     Temp 98 F (36.7 C)     Temp src      SpO2 100 %     Weight      Height      Head Circumference      Peak Flow      Pain Score 10     Pain Loc      Pain Edu?      Excl. in GC?     Constitutional: Alert and oriented.  Appears uncomfortable but in no distress. Head: Normocephalic. Mouth/Throat: Mucous membranes are moist. Multiple missing and decayed teeth.  Gum erythema noted around tooth #23, 41, 31 and 34.  No posterior pharynx erythema or exudate noted. Hematological/Lymphatic/Immunological: No cervical lymphadenopathy. Cardiovascular: Normal rate, regular rhythm.  Respiratory: Normal respiratory effort. No wheezes/rales/rhonchi.  Neurologic:  Normal speech and language. No gross focal neurologic deficits are appreciated. Skin:  Skin is warm, dry and intact. No rash noted. ____________________________________________  INITIAL IMPRESSION / ASSESSMENT AND PLAN / ED COURSE  Dental Pain:  DDx include abscess, cavity Toradol 30 mg IM x1 Rx for Amoxicillin 500 mg p.o. 3 x day x10 days Rx for Ibuprofen 600 mg 3 x daily as needed with food We will have her follow-up with the [facility] dental clinic for further evaluation and treatment  ____________________________________________  FINAL CLINICAL IMPRESSION(S) / ED DIAGNOSES  Final diagnoses:  Pain due to dental caries  Dental abscess      Lorre Munroe, NP 11/03/20 1121    Minna Antis, MD 11/03/20 1530

## 2020-11-03 NOTE — Discharge Instructions (Addendum)
You were seen today for dental pain likely secondary to tooth decay and abscess.  I am giving you an antibiotic to take 3 times daily for the next 10 days.  I am also given you an anti-inflammatory to take every 8 hours as needed with food.  Please follow-up with the [facility] dental [facility] for further evaluation and treatment.

## 2020-11-03 NOTE — ED Notes (Signed)
RN roomed pt. Toothache since Friday. Pt has taken OTC tylenol. Has not taken any since last night. Pt does not have a dentist that she can go to. Pt walked to room. In no acute distress.

## 2020-11-03 NOTE — ED Triage Notes (Signed)
Pt come with c/o left sided dental pain. Pt states pain to eat. Pt states headache from pain. Pt states this started since \\Friday .

## 2020-12-31 DIAGNOSIS — F1190 Opioid use, unspecified, uncomplicated: Principal | ICD-10-CM

## 2021-01-02 ENCOUNTER — Ambulatory Visit: Admit: 2021-01-02 | Discharge: 2021-01-03 | Payer: MEDICAID

## 2021-01-02 DIAGNOSIS — F419 Anxiety disorder, unspecified: Principal | ICD-10-CM

## 2021-01-02 DIAGNOSIS — Z87898 Personal history of other specified conditions: Principal | ICD-10-CM

## 2021-01-02 DIAGNOSIS — F1121 Opioid dependence, in remission: Principal | ICD-10-CM

## 2021-01-02 DIAGNOSIS — Z8659 Personal history of other mental and behavioral disorders: Principal | ICD-10-CM

## 2021-01-02 DIAGNOSIS — R030 Elevated blood-pressure reading, without diagnosis of hypertension: Principal | ICD-10-CM

## 2021-01-02 MED ORDER — NALOXONE 4 MG/ACTUATION NASAL SPRAY
3 refills | 0.00000 days | Status: CP
Start: 2021-01-02 — End: ?

## 2021-01-06 MED ORDER — BUPRENORPHINE 8 MG-NALOXONE 2 MG SUBLINGUAL FILM
Freq: Two times a day (BID) | SUBLINGUAL | 0 refills | 14.00000 days | Status: CP
Start: 2021-01-06 — End: ?

## 2021-01-07 DIAGNOSIS — B1920 Unspecified viral hepatitis C without hepatic coma: Principal | ICD-10-CM

## 2021-01-07 DIAGNOSIS — F419 Anxiety disorder, unspecified: Principal | ICD-10-CM

## 2021-01-07 MED ORDER — CARBAMAZEPINE 100 MG CHEWABLE TABLET
ORAL_TABLET | Freq: Every evening | ORAL | 0 refills | 60 days | Status: CP
Start: 2021-01-07 — End: ?

## 2021-01-07 MED ORDER — HYDROXYZINE HCL 25 MG TABLET
ORAL_TABLET | Freq: Every evening | ORAL | 1 refills | 30.00000 days | Status: CP
Start: 2021-01-07 — End: 2021-01-07

## 2021-01-08 MED ORDER — OXCARBAZEPINE 300 MG TABLET
ORAL_TABLET | Freq: Every evening | ORAL | 2 refills | 30 days | Status: CP
Start: 2021-01-08 — End: 2021-04-07

## 2021-01-20 MED ORDER — BUPRENORPHINE 8 MG-NALOXONE 2 MG SUBLINGUAL FILM
Freq: Two times a day (BID) | SUBLINGUAL | 0 refills | 14 days | Status: CP
Start: 2021-01-20 — End: ?

## 2021-01-29 ENCOUNTER — Ambulatory Visit: Admit: 2021-01-29 | Discharge: 2021-01-30 | Payer: MEDICAID | Attending: Internal Medicine | Primary: Internal Medicine

## 2021-01-29 DIAGNOSIS — F1191 Opioid use disorder in remission: Principal | ICD-10-CM

## 2021-01-29 MED ORDER — BUPRENORPHINE 8 MG-NALOXONE 2 MG SUBLINGUAL FILM
ORAL_FILM | Freq: Two times a day (BID) | SUBLINGUAL | 0 refills | 36 days | Status: CP
Start: 2021-01-29 — End: 2021-03-06

## 2021-02-12 ENCOUNTER — Ambulatory Visit: Admit: 2021-02-12 | Payer: MEDICAID

## 2021-03-05 ENCOUNTER — Telehealth: Admit: 2021-03-05 | Discharge: 2021-03-06 | Payer: MEDICAID | Attending: Internal Medicine | Primary: Internal Medicine

## 2021-03-05 DIAGNOSIS — F1191 Opioid use disorder in remission: Principal | ICD-10-CM

## 2021-03-05 DIAGNOSIS — Z8659 Personal history of other mental and behavioral disorders: Principal | ICD-10-CM

## 2021-03-05 MED ORDER — BUPRENORPHINE 8 MG-NALOXONE 2 MG SUBLINGUAL FILM
ORAL_FILM | Freq: Two times a day (BID) | SUBLINGUAL | 0 refills | 28.00000 days | Status: CP
Start: 2021-03-05 — End: 2021-04-02

## 2021-03-09 ENCOUNTER — Other Ambulatory Visit: Payer: Self-pay

## 2021-03-09 ENCOUNTER — Encounter: Payer: Self-pay | Admitting: Emergency Medicine

## 2021-03-09 ENCOUNTER — Emergency Department
Admission: EM | Admit: 2021-03-09 | Discharge: 2021-03-09 | Disposition: A | Discharge status: Home / Self Care | Payer: Medicaid Other | Attending: Emergency Medicine | Admitting: Emergency Medicine

## 2021-03-09 DIAGNOSIS — R519 Headache, unspecified: Secondary | ICD-10-CM | POA: Diagnosis present

## 2021-03-09 DIAGNOSIS — G43009 Migraine without aura, not intractable, without status migrainosus: Secondary | ICD-10-CM | POA: Diagnosis not present

## 2021-03-09 DIAGNOSIS — F17200 Nicotine dependence, unspecified, uncomplicated: Secondary | ICD-10-CM | POA: Diagnosis not present

## 2021-03-09 LAB — POC URINE PREG, ED: Preg Test, Ur: NEGATIVE

## 2021-03-09 MED ORDER — DIPHENHYDRAMINE HCL 50 MG/ML IJ SOLN
25.0000 mg | Freq: Once | INTRAMUSCULAR | Status: AC
  Administered 2021-03-09: 25 mg via INTRAVENOUS
  Filled 2021-03-09: qty 1

## 2021-03-09 MED ORDER — KETOROLAC TROMETHAMINE 30 MG/ML IJ SOLN
30.0000 mg | Freq: Once | INTRAMUSCULAR | Status: AC
  Administered 2021-03-09: 30 mg via INTRAVENOUS
  Filled 2021-03-09: qty 1

## 2021-03-09 MED ORDER — SODIUM CHLORIDE 0.9 % IV BOLUS (SEPSIS)
1000.0000 mL | Freq: Once | INTRAVENOUS | Status: AC
  Administered 2021-03-09: 1000 mL via INTRAVENOUS

## 2021-03-09 MED ORDER — PROCHLORPERAZINE EDISYLATE 10 MG/2ML IJ SOLN
10.0000 mg | Freq: Once | INTRAMUSCULAR | Status: AC
  Administered 2021-03-09: 10 mg via INTRAVENOUS
  Filled 2021-03-09: qty 2

## 2021-03-09 NOTE — ED Notes (Signed)
Patient and patient belongings not in room. IV fluids disconnected from IV and no sign of IV catheter noted. Patient's significant other called by Dr. Elesa Massed and informed that patient needs to return to ER for IV removal within 15 minutes or police will be called.

## 2021-03-09 NOTE — ED Notes (Signed)
Patient arrived back to ED lobby. Triage RN inspected patient and patient was found with no IV in place.

## 2021-03-09 NOTE — ED Provider Notes (Signed)
[facility] Provider Note    Event Date/Time   First MD Initiated Contact with Patient 03/09/21 [phone]     (approximate)   History   Migraine   HPI  Annette Roberts is a 36 y.o. female with history of opiate use disorder currently on Suboxone, alcohol use disorder, hepatitis C, migraine headaches who presents to the emergency department with bilateral throbbing headache that started about 5 days ago.  Feels similar to her previous migraines but they have never lasted this long.  She has had nausea and vomiting.  She states her eyes are watering because of the pain.  No head injury.  Not on blood thinners.  No fever.  No neck pain or neck stiffness.  No numbness, tingling or focal weakness.   History provided by patient.      Past Medical History:  Diagnosis Date   Current smoker    Depression    does not want anyone incl  FOB to know   History of preterm premature rupture of membranes (PPROM)    G2   Low grade squamous intraepithelial lesion (LGSIL) on cervical Pap smear 2017   Marijuana use    Methadone maintenance treatment affecting pregnancy (HCC)    Scoliosis     Past Surgical History:  Procedure Laterality Date   CESAREAN SECTION  09/10/2009   FTP/FITL, meconium   CESAREAN SECTION N/A 05/22/2015   Procedure: CESAREAN SECTION;  Surgeon: Conard Novak, MD;  Location: [facility] ORS;  Service: Obstetrics;  Laterality: N/A;   CESAREAN SECTION N/A 11/29/2018   Procedure: CESAREAN SECTION;  Surgeon: Conard Novak, MD;  Location: [facility] ORS;  Service: Obstetrics;  Laterality: N/A;    MEDICATIONS:  Prior to Admission medications   Medication Sig Start Date End Date Taking? Authorizing Provider  amoxicillin (AMOXIL) 500 MG capsule Take 1 capsule (500 mg total) by mouth 3 (three) times daily. 11/03/20   Lorre Munroe, NP  Buprenorphine HCl-Naloxone HCl (SUBOXONE) 8-2 MG FILM Place 1 Film under the tongue in the morning and at bedtime. 07/04/20    Alford Highland, MD  ibuprofen (ADVIL) 600 MG tablet Take 1 tablet (600 mg total) by mouth every 8 (eight) hours as needed. 11/03/20   Lorre Munroe, NP    Physical Exam   Triage Vital Signs: ED Triage Vitals  Enc Vitals Group     BP 03/09/21 0440 (!) 154/93     Pulse Rate 03/09/21 0440 99     Resp 03/09/21 0440 18     Temp 03/09/21 0440 98.2 F (36.8 C)     Temp Source 03/09/21 0440 Tympanic     SpO2 03/09/21 0440 100 %     Weight 03/09/21 0439 126 lb 15.8 oz (57.6 kg)     Height 03/09/21 0439 5\' 1"  (1.549 m)     Head Circumference --      Peak Flow --      Pain Score 03/09/21 0439 10     Pain Loc --      Pain Edu? --      Excl. in GC? --     Most recent vital signs: Vitals:   03/09/21 0514 03/09/21 0554  BP: (!) 125/91 (!) 125/91  Pulse: 77 75  Resp: 16 16  Temp:    SpO2: 100% 100%    CONSTITUTIONAL: Alert and oriented and responds appropriately to questions. Well-appearing; well-nourished HEAD: Normocephalic, atraumatic EYES: Conjunctivae clear, pupils appear equal, sclera nonicteric ENT: normal nose;  moist mucous membranes NECK: Supple, normal ROM, no meningismus CARD: RRR; S1 and S2 appreciated; no murmurs, no clicks, no rubs, no gallops RESP: Normal chest excursion without splinting or tachypnea; breath sounds clear and equal bilaterally; no wheezes, no rhonchi, no rales, no hypoxia or respiratory distress, speaking full sentences ABD/GI: Normal bowel sounds; non-distended; soft, non-tender, no rebound, no guarding, no peritoneal signs BACK: The back appears normal EXT: Normal ROM in all joints; no deformity noted, no edema; no cyanosis SKIN: Normal color for age and race; warm; no rash on exposed skin NEURO: Moves all extremities equally, normal speech, normal sensation diffusely, cranial nerves II through XII intact, normal speech PSYCH: The patient's mood and manner are appropriate.   ED Results / Procedures / Treatments   LABS: (all labs ordered are  listed, but only abnormal results are displayed) Labs Reviewed  POC URINE PREG, ED     EKG:     RADIOLOGY: My personal review and interpretation of imaging:    I have personally reviewed all radiology reports.   No results found.   PROCEDURES:  Critical Care performed: No     Procedures    IMPRESSION / MDM / ASSESSMENT AND PLAN / ED COURSE  I reviewed the triage vital signs and the nursing notes.    Patient with complaints of headache.  History of migraine headaches and states this feels similar.     DIFFERENTIAL DIAGNOSIS (includes but not limited to):   Migraine, less likely ICH, CVA, meningitis, CVT   PLAN: We will obtain urine pregnancy test.  Will give IV Toradol and IV fluids.   MEDICATIONS GIVEN IN ED: Medications  ketorolac (TORADOL) 30 MG/ML injection 30 mg (30 mg Intravenous Given 03/09/21 0516)  sodium chloride 0.9 % bolus 1,000 mL (1,000 mLs Intravenous New Bag/Given 03/09/21 0515)  prochlorperazine (COMPAZINE) injection 10 mg (10 mg Intravenous Given 03/09/21 0611)  diphenhydrAMINE (BENADRYL) injection 25 mg (25 mg Intravenous Given 03/09/21 0608)     ED COURSE: Pregnancy test negative.  No significant improvement of headache after Toradol.  Will give Compazine and Benadryl.  7:03 AM  Went to reassess patient after Compazine and Benadryl and she had unhooked her IV fluids but appeared to have left the emergency department with her peripheral IV in place as there was no remnants of her IV in the room.  I contacted her significant other who was listed in her emergency contacts.  He states that she left with him.  Explained that she needed to come back to the emergency department to show us that her peripheral IV was out or we would send police to her residence.  I am concerned given she has a history of substance abuse.  I would recommend extreme caution in the future with placing a peripheral IV in this patient unless absolutely necessary.   I was  unable to provide her with any prescriptions or follow-up information.  Unable to provide her with any discharge instructions as she eloped from the emergency room.  CONSULTS: Unable to perform any consult as patient left the emergency department prior to reassessment.   OUTSIDE RECORDS REVIEWED: [facility] in November 2022 which confirms substance abuse issues in remission.         FINAL CLINICAL IMPRESSION(S) / ED DIAGNOSES   Final diagnoses:  Migraine without aura and without status migrainosus, not intractable     Rx / DC Orders   ED Discharge Orders  None        Note:  This document was prepared using Dragon voice recognition software and may include unintentional dictation errors.   Abrea Henle, Layla Maw, DO 03/09/21 [phone]

## 2021-03-09 NOTE — ED Notes (Signed)
Patient reports she called her boyfriend and boyfriend stated he would be able to come pick her up when it is time for discharge.

## 2021-03-09 NOTE — ED Notes (Signed)
Pt returned to the ED. This RN and Glass blower/designer assessed pts arm, IV had been removed and a band aid was in place. MD and RN made aware.

## 2021-03-09 NOTE — ED Triage Notes (Signed)
Pt C/o migraine x 4-5 days. Pt states that no medications have helped.

## 2021-03-15 MED ORDER — OXCARBAZEPINE 300 MG TABLET
ORAL_TABLET | Freq: Every evening | ORAL | 2 refills | 30 days | Status: CP
Start: 2021-03-15 — End: 2021-06-13

## 2021-04-02 ENCOUNTER — Ambulatory Visit: Admit: 2021-04-02 | Discharge: 2021-04-02 | Payer: MEDICAID | Attending: Internal Medicine | Primary: Internal Medicine

## 2021-04-02 DIAGNOSIS — F1191 Opioid use disorder in remission: Principal | ICD-10-CM

## 2021-04-02 MED ORDER — SUBOXONE 8 MG-2 MG SUBLINGUAL FILM
ORAL_FILM | Freq: Two times a day (BID) | SUBLINGUAL | 0 refills | 30 days | Status: CP
Start: 2021-04-02 — End: 2021-05-02
  Filled 2021-04-02: qty 60, 30d supply, fill #0

## 2021-04-02 MED ORDER — BUPRENORPHINE 8 MG-NALOXONE 2 MG SUBLINGUAL FILM
ORAL_FILM | Freq: Two times a day (BID) | SUBLINGUAL | 0 refills | 28.00000 days | Status: CP
Start: 2021-04-02 — End: 2021-04-02

## 2021-04-11 ENCOUNTER — Ambulatory Visit: Admit: 2021-04-11 | Discharge: 2021-04-12 | Payer: MEDICAID

## 2021-04-11 DIAGNOSIS — R030 Elevated blood-pressure reading, without diagnosis of hypertension: Principal | ICD-10-CM

## 2021-04-11 DIAGNOSIS — G4700 Insomnia, unspecified: Principal | ICD-10-CM

## 2021-04-11 DIAGNOSIS — Z0000 Encounter for general adult medical examination without abnormal findings: Principal | ICD-10-CM

## 2021-04-11 DIAGNOSIS — B182 Chronic viral hepatitis C: Principal | ICD-10-CM

## 2021-04-11 DIAGNOSIS — F1191 Opioid use disorder in remission: Principal | ICD-10-CM

## 2021-04-11 LAB — HM HEPATITIS C SCREENING LAB: HM Hepatitis Screen: POSITIVE

## 2021-04-11 MED ORDER — GABAPENTIN 100 MG CAPSULE
ORAL_CAPSULE | Freq: Every evening | ORAL | 1 refills | 30.00000 days | Status: CP
Start: 2021-04-11 — End: 2021-06-10

## 2021-04-11 MED ORDER — OXCARBAZEPINE 300 MG TABLET
ORAL_TABLET | Freq: Every evening | ORAL | 2 refills | 30 days | Status: CP
Start: 2021-04-11 — End: 2021-07-10

## 2021-04-28 DIAGNOSIS — F1191 Opioid use disorder in remission: Principal | ICD-10-CM

## 2021-04-30 MED ORDER — BUPRENORPHINE 8 MG-NALOXONE 2 MG SUBLINGUAL FILM
ORAL_FILM | Freq: Two times a day (BID) | SUBLINGUAL | 0 refills | 28 days | Status: CP
Start: 2021-04-30 — End: 2021-05-28

## 2021-04-30 MED ORDER — SUBOXONE 8 MG-2 MG SUBLINGUAL FILM
ORAL_FILM | Freq: Two times a day (BID) | SUBLINGUAL | 0 refills | 28.00000 days | Status: CP
Start: 2021-04-30 — End: 2021-05-28

## 2021-05-02 ENCOUNTER — Telehealth: Admit: 2021-05-02 | Discharge: 2021-05-03 | Payer: MEDICAID | Attending: Internal Medicine | Primary: Internal Medicine

## 2021-05-02 DIAGNOSIS — B182 Chronic viral hepatitis C: Principal | ICD-10-CM

## 2021-05-02 DIAGNOSIS — K029 Dental caries, unspecified: Principal | ICD-10-CM

## 2021-05-02 DIAGNOSIS — F1191 Opioid use disorder in remission: Principal | ICD-10-CM

## 2021-05-02 MED ORDER — MELOXICAM 7.5 MG TABLET
ORAL_TABLET | Freq: Every day | ORAL | 1 refills | 5 days | Status: CP
Start: 2021-05-02 — End: 2021-05-07

## 2021-05-02 MED ORDER — GABAPENTIN 100 MG CAPSULE
ORAL_CAPSULE | Freq: Every evening | ORAL | 0 refills | 30 days | Status: CP
Start: 2021-05-02 — End: 2021-06-01

## 2021-05-23 MED ORDER — SUBOXONE 8 MG-2 MG SUBLINGUAL FILM
ORAL_FILM | Freq: Two times a day (BID) | SUBLINGUAL | 0 refills | 41 days | Status: CP
Start: 2021-05-23 — End: 2021-07-03

## 2021-05-28 ENCOUNTER — Ambulatory Visit: Admit: 2021-05-28 | Discharge: 2021-05-29 | Payer: MEDICAID

## 2021-05-28 DIAGNOSIS — B182 Chronic viral hepatitis C: Principal | ICD-10-CM

## 2021-05-28 DIAGNOSIS — Z8659 Personal history of other mental and behavioral disorders: Principal | ICD-10-CM

## 2021-05-28 DIAGNOSIS — G4700 Insomnia, unspecified: Principal | ICD-10-CM

## 2021-05-28 DIAGNOSIS — F1191 Opioid use disorder in remission: Principal | ICD-10-CM

## 2021-05-28 MED ORDER — HYDROXYZINE HCL 25 MG TABLET
ORAL_TABLET | Freq: Three times a day (TID) | ORAL | 2 refills | 20 days | Status: CP | PRN
Start: 2021-05-28 — End: ?

## 2021-05-29 MED ORDER — GLECAPREVIR 100 MG-PIBRENTASVIR 40 MG TABLET
ORAL_TABLET | Freq: Every day | ORAL | 1 refills | 28 days | Status: CP
Start: 2021-05-29 — End: ?
  Filled 2021-06-06: qty 84, 28d supply, fill #0

## 2021-05-30 DIAGNOSIS — B182 Chronic viral hepatitis C: Principal | ICD-10-CM

## 2021-06-04 NOTE — Unmapped (Signed)
[facility] Specialty Medication Onboarding    Specialty Medication: Mavyret 100-40mg  tablet  Prior Authorization: Approved   Financial Assistance: No - copay  <$25  Final Copay/Day Supply: $4 / 28 days    Insurance Restrictions: None     Notes to Pharmacist: N/A    The triage team has completed the benefits investigation and has determined that the patient is able to fill this medication at [facility]. Please contact the patient to complete the onboarding or follow up with the prescribing physician as needed.

## 2021-06-04 NOTE — Unmapped (Signed)
2nd attempt [facility] chart sent to patient   Evy

## 2021-06-05 NOTE — Unmapped (Signed)
3x& final attempt. Need tos chedule nurse visit for 2nd dose of the hep a/b vaccine.     Traci Salazar  06/05/21

## 2021-06-05 NOTE — Unmapped (Addendum)
[facility]   Patient Onboarding/Medication Counseling    Traci Salazar is a 36 y.o. female with Hepatitis C who I am counseling today on initiation of therapy.  I am speaking to the patient.    Was a Nurse, learning disability used for this call? No    Verified patient's date of birth / HIPAA.    Specialty medication(s) to be sent: Infectious Disease: Mavyret      Non-specialty medications/supplies to be sent: n/a      Medications not needed at this time: n/a         Mavyret (glecaprevir and pibrentasvir) 100mg /40mg     Planned Start Date: 06/09/21      Medication & Administration     Dosage: Take three tablets by mouth once daily for 8 weeks.    Administration: Take with food.    Adherence/Missed dose instructions:   Take missed dose with food as soon as you think about it.   If it has been more than 18 hours since the missed dose, skip the missed dose and resume with your next scheduled dose.  Do not take extra doses or 2 doses at the same time.    Goals of Therapy     The goal of therapy is to cure the patient of Hepatitis C. Patients who have undetectable HCV RNA in the serum, as assessed by a sensitive polymerase chain reaction (PCR) assay, ?12 weeks after treatment completion are deemed to have achieved SVR (cure). (www.hcvguidelines.org)     Side Effects & Monitoring Parameters     Common Side Effects:  Headache  Fatigue   Nausea    The following side effects should be reported to the provider:  Signs of liver problems like dark urine, feeling tired, lack of hunger, upset stomach or stomach pain, light-colored stools, throwing up, or yellow skin/eyes.  Signs of an allergic reaction, such as rash; hives; itching; red, swollen, blistered, or peeling skin with or without fever.   If you have wheezing; tightness in the chest or throat; trouble breathing, swallowing, or talking; unusual hoarseness; or swelling of the mouth, face, lips, tongue, or throat, call 911 or go to the closest emergency department (ED). Monitoring Parameters: (AASLD-IDSA Guidelines)    Within 6 months prior to starting treatment:  Complete blood count (CBC).  International normalized ratio (INR) if clinically indicated.   Hepatic function panel: albumin, total and direct bilirubin, ALT, AST, and Alkaline Phosphatase levels.  Calculated glomerular filtration rate (eGFR).  Pregnancy testing within 1 month prior to starting treatment in females of childbearing age  Any time prior to starting treatment:  Test for HCV genotype.  Assess for hepatic fibrosis.  Quantitative HCV RNA (HCV viral load).  Assess for active HBV coinfection: HBV surface antigen (HBsAg); If HBsAg positive, then should be assessed for whether HBV DNA level meets AASLD criteria for HBV treatment.  Assess for evidence of prior HBV infection and immunity: HBV core antibody (anti-HBc) and HBV surface antibody (anti-HBS).   Assess for HIV coinfection.    Monitoring during treatment:   Diabetics should monitor for signs of hypoglycemia.  Patients on warfarin should monitor for changes in INR.  Pregnancy test monthly in females of childbearing age.  For HBsAg positive patients not already on HBV therapy because baseline HBV DNA level does not meet treatment criteria:   Initiate prophylactic HBV antiviral therapy OR  Monitor HBV DNA levels monthly during and immediately after Mavyret therapy.    After treatment to document a cure:  Quantitative HCV viral load 12 weeks after completion of therapy.    Contraindications, Warnings, & Precautions     Contraindications: Moderate or severe hepatic impairment (Child-Pugh class B or C); history of hepatic decompensation; coadministration with atazanavir or rifampin.    Disease-related concerns:   Hepatitis B virus reactivation.   Risk of hepatic decompensation/failure in patients with evidence of advanced liver disease.    Drug/Food Interactions     Medication list reviewed in Epic. The patient was instructed to inform the care team before taking any new medications or supplements.  Patient is holding her oxcarbazepine while on Mavyret .   Encourage minimizing alcohol intake: I told her that it is best to not have any alcohol because alcohol makes it harder to clear the virus and it is hard on the liver.    Storage, Handling Precautions, & Disposal     Store in the original container at room temperature.   Store in a dry place. Do not store in a bathroom.   Keep all drugs in a safe place. Keep all drugs out of the reach of children and pets.   Disposal: You should NOT have any Mavyret to throw away.      Current Medications (including OTC/herbals), Comorbidities and Allergies     Current Outpatient Medications   Medication Sig Dispense Refill    glecaprevir-pibrentasvir (MAVYRET) 100-40 mg tablet Take 3 tablets by mouth daily. Take with food. 84 tablet 1    hydrOXYzine (ATARAX) 25 MG tablet Take 1 tablet (25 mg total) by mouth Three (3) times a day as needed for anxiety or other (insomnia). 60 tablet 2    naloxone (NARCAN) 4 mg nasal spray One spray in either nostril once for known/suspected opioid overdose. May repeat every 2-3 minutes in alternating nostril til EMS arrives 2 each 3    SUBOXONE 8-2 mg sublingual film Place 1 Film (8 mg of buprenorphine total) under the tongue Two (2) times a day. 82 Film 0     No current facility-administered medications for this visit.       No Known Allergies    Patient Active Problem List   Diagnosis    History of alcohol use disorder    Opioid use disorder in remission    Elevated blood pressure reading    Anxiety    History of depression    Chronic hepatitis C without hepatic coma (CMS-HCC)       Reviewed and up to date in Epic.    Appropriateness of Therapy     Acute infections noted within Epic:  No active infections  Patient reported infection: None    Is medication and dose appropriate based on diagnosis and infection status? Yes    Prescription has been clinically reviewed: Yes      Baseline Quality of Life Assessment      How many days over the past month did your Hepatitis C  keep you from your normal activities? For example, brushing your teeth or getting up in the morning. 0    Financial Information     Medication Assistance provided: Prior Authorization    Anticipated copay of $4.00 reviewed with patient. Verified delivery address.    Delivery Information     //Scheduled delivery date: 06/09/21    Expected start date: 06/09/21    Medication will be delivered via UPS to the prescription address in [facility].  This shipment will not require a signature.      Explained the services we  provide at [facility] and that each month we would call to set up refills.  Stressed importance of returning phone calls so that we could ensure they receive their medications in time each month.  Informed patient that we should be setting up refills 7-10 days prior to when they will run out of medication.  A pharmacist will reach out to perform a clinical assessment periodically.  Informed patient that a welcome packet, containing information about our pharmacy and other support services, a Notice of Privacy Practices, and a drug information handout will be sent.      The patient or caregiver noted above participated in the development of this care plan and knows that they can request review of or adjustments to the care plan at any time.      Patient or caregiver verbalized understanding of the above information as well as how to contact the pharmacy at [phone] option 4 with any questions/concerns.  The pharmacy is open Monday through Friday 8:30am-4:30pm.  A pharmacist is available 24/7 via pager to answer any clinical questions they may have.    Patient Specific Needs     Does the patient have any physical, cognitive, or cultural barriers? No    Does the patient have adequate living arrangements? (i.e. the ability to store and take their medication appropriately) Yes    Did you identify any home environmental safety or security hazards? No    Patient prefers to have medications discussed with  Patient     Is the patient or caregiver able to read and understand education materials at a high school level or above? Yes    Patient's primary language is  English     Is the patient high risk? No        Roderic Palau  [facility] Specialty Pharmacist

## 2021-06-16 ENCOUNTER — Emergency Department
Admission: EM | Admit: 2021-06-16 | Discharge: 2021-06-16 | Disposition: A | Discharge status: Home / Self Care | Payer: Medicaid Other | Attending: Emergency Medicine | Admitting: Emergency Medicine

## 2021-06-16 ENCOUNTER — Other Ambulatory Visit: Payer: Self-pay

## 2021-06-16 DIAGNOSIS — F17200 Nicotine dependence, unspecified, uncomplicated: Secondary | ICD-10-CM | POA: Diagnosis not present

## 2021-06-16 DIAGNOSIS — B850 Pediculosis due to Pediculus humanus capitis: Secondary | ICD-10-CM | POA: Diagnosis not present

## 2021-06-16 DIAGNOSIS — B852 Pediculosis, unspecified: Secondary | ICD-10-CM | POA: Diagnosis present

## 2021-06-16 MED ORDER — PERMETHRIN 5 % EX CREA: 1.0000 "application " | TOPICAL_CREAM | Freq: Once | CUTANEOUS | 0 refills | Status: AC

## 2021-06-16 MED ORDER — DIPHENHYDRAMINE HCL 25 MG PO CAPS
50.0000 mg | ORAL_CAPSULE | Freq: Once | ORAL | Status: AC
  Administered 2021-06-16: 50 mg via ORAL
  Filled 2021-06-16: qty 2

## 2021-06-16 NOTE — Discharge Instructions (Signed)
May use Benadryl 50 mg every 6 hours as needed for itching. ? ? ?Steps to find a Primary Care Provider (PCP): ? ?Call [phone] or [phone] to access "[facility] Find a Doctor Service." ? ?2.  You may also go on the [facility] website at [email or web address]  ?

## 2021-06-16 NOTE — ED Provider Notes (Signed)
? ?[facility] ?Provider Note ? ? ? Event Date/Time  ? First MD Initiated Contact with Patient 06/16/21 0149   ?  (approximate) ? ? ?History  ? ?possible lice infestation ? ? ?HPI ? ?Annette Roberts is a 36 y.o. female with history of depression, hepatitis C, opiate use disorder, anxiety who presents to the emergency department with concerns for scalp lice infestation.  States her son came home with lice from school and that she got it as well.  She has been to over-the-counter treatments and states she feels like there is burning to her scalp and feels like there is still things crawling.  She states she sees small white things in her hair.  No other rash, exposures.  No systemic symptoms. ? ? ?History provided by patient. ? ? ? ?Past Medical History:  ?Diagnosis Date  ? Current smoker   ? Depression   ? does not want anyone incl  FOB to know  ? History of preterm premature rupture of membranes (PPROM)   ? G2  ? Low grade squamous intraepithelial lesion (LGSIL) on cervical Pap smear 2017  ? Marijuana use   ? 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 Scoliosis   ? ? ?Past Surgical History:  ?Procedure Laterality Date  ? CESAREAN SECTION  09/10/2009  ? FTP/FITL, meconium  ? CESAREAN SECTION N/A 05/22/2015  ? Procedure: CESAREAN SECTION;  Surgeon: Will Bonnet, MD;  Location: [facility] ORS;  Service: Obstetrics;  Laterality: N/A;  ? CESAREAN SECTION N/A 11/29/2018  ? Procedure: CESAREAN SECTION;  Surgeon: Will Bonnet, MD;  Location: [facility] ORS;  Service: Obstetrics;  Laterality: N/A;  ? ? ?MEDICATIONS:  ?Prior to Admission medications   ?Medication Sig Start Date End Date Taking? Authorizing Provider  ?amoxicillin (AMOXIL) 500 MG capsule Take 1 capsule (500 mg total) by mouth 3 (three) times daily. 11/03/20   Jearld Fenton, NP  ?Buprenorphine HCl-Naloxone HCl (SUBOXONE) 8-2 MG FILM Place 1 Film under the tongue in the morning and at bedtime. 07/04/20   Loletha Grayer, MD   ?ibuprofen (ADVIL) 600 MG tablet Take 1 tablet (600 mg total) by mouth every 8 (eight) hours as needed. 11/03/20   Jearld Fenton, NP  ? ? ?Physical Exam  ? ?Triage Vital Signs: ?ED Triage Vitals  ?Enc Vitals Group  ?   BP 06/16/21 0117 (!) 158/90  ?   Pulse Rate 06/16/21 0117 90  ?   Resp 06/16/21 0117 18  ?   Temp 06/16/21 0117 98.4 ?F (36.9 ?C)  ?   Temp src --   ?   SpO2 06/16/21 0117 96 %  ?   Weight 06/16/21 0118 125 lb (56.7 kg)  ?   Height 06/16/21 0118 5\' 1"  (1.549 m)  ?   Head Circumference --   ?   Peak Flow --   ?   Pain Score 06/16/21 0118 8  ?   Pain Loc --   ?   Pain Edu? --   ?   Excl. in [place]? --   ? ? ?Most recent vital signs: ?Vitals:  ? 06/16/21 0117  ?BP: (!) 158/90  ?Pulse: 90  ?Resp: 18  ?Temp: 98.4 ?F (36.9 ?C)  ?SpO2: 96%  ? ? ? ?CONSTITUTIONAL: Alert and responds appropriately to questions. Well-appearing; well-nourished ?HEAD: Normocephalic, atraumatic, scalp appears normal, I do not appreciate any lice or nits, there are new lesions to the skin of her scalp, she does have dandruff ?  EYES: Conjunctivae clear, pupils appear equal, no scleral icterus ?ENT: normal nose; moist mucous membranes no angioedema, normal phonation ?NECK: Normal range of motion ?CARD: Regular rate and rhythm ?RESP: Normal chest excursion without splinting or tachypnea; no hypoxia or respiratory distress, speaking full sentences ?ABD/GI: non-distended ?EXT: Normal ROM in all joints, no major deformities noted ?SKIN: Normal color for age and race, no rashes on exposed skin, no petechia or purpura, no blisters or desquamation, no urticaria jaundice ?NEURO: Moves all extremities equally, normal speech, no facial asymmetry noted ?PSYCH: The patient's mood and manner are appropriate. Grooming and personal hygiene are appropriate. ? ?ED Results / Procedures / Treatments  ? ?LABS: ?(all labs ordered are listed, but only abnormal results are displayed) ?Labs Reviewed - No data to display ? ? ?EKG: ? ? ?RADIOLOGY: ?My personal  review and interpretation of imaging:   ? ?I have personally reviewed all radiology reports. ?No results found. ? ? ?PROCEDURES: ? ?Critical Care performed: No ? ? ? ? ?Procedures ? ? ? ?IMPRESSION / MDM / ASSESSMENT AND PLAN / ED COURSE  ?I reviewed the triage vital signs and the nursing notes. ? ? ?Patient here with scalp itching and burning and concern for lice infestation. ? ? ? ? ?DIFFERENTIAL DIAGNOSIS (includes but not limited to):   Lice, scabies, scalp irritation and dryness from recent permethrin use, delusional parasitosis, anxiety ? ? ?PLAN: Patient here after she was exposed to lice from her son.  She has done 2 over-the-counter treatments but states she still feels like there are insects crawling on her scalp and it is itching and burning.  I do not appreciate any lice, nits on exam but she does have some mild dandruff.  Her scalp may be irritated from the over-the-counter medications she has been using but she is convinced that this is lice.  We will give her prescription for permethrin but discussed with her I do not feel she needs anything stronger or oral ivermectin given I see no lice on exam at this time.  She has no sign of scalp cellulitis, abscess or dermatitis.  Recommended Benadryl as needed for itching.  Recommended follow-up with her primary care doctor for further management of this nonemergent condition. ? ? ?MEDICATIONS GIVEN IN ED: ?Medications  ?diphenhydrAMINE (BENADRYL) capsule 50 mg (50 mg Oral Given 06/16/21 0206)  ? ? ? ?ED COURSE:  At this time, I do not feel there is any life-threatening condition present. I reviewed all nursing notes, vitals, pertinent previous records.  All lab and urine results, EKGs, imaging ordered have been independently reviewed and interpreted by myself.  I reviewed all available radiology reports from any imaging ordered this visit.  Based on my assessment, I feel the patient is safe to be discharged home without further emergent workup and can continue  workup as an outpatient as needed. Discussed all findings, treatment plan as well as usual and customary return precautions with patient.  They verbalize understanding and are comfortable with this plan.  Outpatient follow-up has been provided as needed.  All questions have been answered. ? ? ? ?CONSULTS: No admission needed for this nonemergent condition. ? ? ?OUTSIDE RECORDS REVIEWED: Reviewed patient's last office visit with Raliegh Ip on 05/28/2021. ? ? ? ? ? ? ? ? ?FINAL CLINICAL IMPRESSION(S) / ED DIAGNOSES  ? ?Final diagnoses:  ?Head lice  ? ? ? ?Rx / DC Orders  ? ?ED Discharge Orders   ? ?      Ordered  ?  permethrin (ELIMITE) 5 % cream   Once       ? 06/16/21 0205  ? ?  ?  ? ?  ? ? ? ?Note:  This document was prepared using Dragon voice recognition software and may include unintentional dictation errors. ?  ?Kriste Broman, Delice Bison, DO ?06/16/21 0228 ? ?

## 2021-06-16 NOTE — ED Triage Notes (Signed)
Pt states her son got lice at school two weeks ago. Pt states she feels like she has lice crawling on her scalp and her scalp is burning. Pt states she has tried two lice treatments at home. No lice visualized in triage.  ?

## 2021-06-20 ENCOUNTER — Emergency Department
Admission: EM | Admit: 2021-06-20 | Discharge: 2021-06-20 | Disposition: A | Discharge status: Home / Self Care | Payer: Medicaid Other | Attending: Emergency Medicine | Admitting: Emergency Medicine

## 2021-06-20 ENCOUNTER — Other Ambulatory Visit: Payer: Self-pay

## 2021-06-20 ENCOUNTER — Encounter: Payer: Self-pay | Admitting: Emergency Medicine

## 2021-06-20 ENCOUNTER — Emergency Department: Payer: Medicaid Other

## 2021-06-20 DIAGNOSIS — T7840XA Allergy, unspecified, initial encounter: Secondary | ICD-10-CM | POA: Insufficient documentation

## 2021-06-20 DIAGNOSIS — R21 Rash and other nonspecific skin eruption: Secondary | ICD-10-CM | POA: Insufficient documentation

## 2021-06-20 DIAGNOSIS — R519 Headache, unspecified: Secondary | ICD-10-CM | POA: Insufficient documentation

## 2021-06-20 DIAGNOSIS — Z5321 Procedure and treatment not carried out due to patient leaving prior to being seen by health care provider: Secondary | ICD-10-CM | POA: Insufficient documentation

## 2021-06-20 DIAGNOSIS — E876 Hypokalemia: Secondary | ICD-10-CM

## 2021-06-20 DIAGNOSIS — T50905A Adverse effect of unspecified drugs, medicaments and biological substances, initial encounter: Secondary | ICD-10-CM

## 2021-06-20 LAB — COMPREHENSIVE METABOLIC PANEL
ALT: 10 U/L (ref 0–44)
AST: 21 U/L (ref 15–41)
Albumin: 5 g/dL (ref 3.5–5.0)
Alkaline Phosphatase: 71 U/L (ref 38–126)
Anion gap: 10 (ref 5–15)
BUN: 9 mg/dL (ref 6–20)
CO2: 24 mmol/L (ref 22–32)
Calcium: 8.8 mg/dL — ABNORMAL LOW (ref 8.9–10.3)
Chloride: 104 mmol/L (ref 98–111)
Creatinine, Ser: 0.86 mg/dL (ref 0.44–1.00)
GFR, Estimated: >60 mL/min (ref >60)
Glucose, Bld: 85 mg/dL (ref 70–99)
Potassium: 2.7 mmol/L — CL (ref 3.5–5.1)
Sodium: 138 mmol/L (ref 135–145)
Total Bilirubin: 0.7 mg/dL (ref 0.3–1.2)
Total Protein: 7.6 g/dL (ref 6.5–8.1)

## 2021-06-20 LAB — CBC
HCT: 36.3 % (ref 36.0–46.0)
Hemoglobin: 12.4 g/dL (ref 12.0–15.0)
MCH: 28.9 pg (ref 26.0–34.0)
MCHC: 34.2 g/dL (ref 30.0–36.0)
MCV: 84.6 fL (ref 80.0–100.0)
Platelets: 296 10*3/uL (ref 150–400)
RBC: 4.29 MIL/uL (ref 3.87–5.11)
RDW: 12.6 % (ref 11.5–15.5)
WBC: 8.9 10*3/uL (ref 4.0–10.5)
nRBC: 0 % (ref 0.0–0.2)

## 2021-06-20 LAB — POC URINE PREG, ED: Preg Test, Ur: NEGATIVE

## 2021-06-20 LAB — TROPONIN I (HIGH SENSITIVITY): Troponin I (High Sensitivity): 3 ng/L (ref <18)

## 2021-06-20 MED ORDER — POTASSIUM CHLORIDE CRYS ER 20 MEQ PO TBCR: 20.0000 meq | EXTENDED_RELEASE_TABLET | Freq: Two times a day (BID) | ORAL | 0 refills | Status: DC

## 2021-06-20 NOTE — Discharge Instructions (Addendum)
I believe your symptoms are side effects from your medication. However, your medication is important and I would suggest continuing if possible. I do recommend discussion with your doctor that prescribed the medication. ?

## 2021-06-20 NOTE — ED Triage Notes (Signed)
Pt presents to ER via ems from home c/o "feeling like my esophagus is going to close up, I feel like there is a worm in my hair, and my skin turns orange sometimes."  Pt states she was started on maverick around a week ago and has been feeling this way since then.  Pt very anxious in triage.  No distress noted.  Resp even and unlabored.    ?

## 2021-06-20 NOTE — ED Triage Notes (Signed)
Pt presents to ER with complaints of side effects of medication she is taking for Hep. C. Pt is taking MAVYRET. Pt reports she feels like she is not thinking clear, panick attacks, pt reports she takes this medication every day. Pt talks in complete sentences no distress noted.  ?

## 2021-06-20 NOTE — ED Provider Notes (Signed)
? ?[facility] ?Provider Note ? ? ? Event Date/Time  ? First MD Initiated Contact with Patient 06/20/21 [phone]   ?  (approximate) ? ? ?History  ? ?Medication Reaction ? ? ?HPI ? ?Annette Roberts is a 36 y.o. female chronic hepatitis C who started Mavyret treatment 1 week ago. She describes intermittent rashes, and headaches. No fevers/chills. Sometimes she feels a strange sensation in her scalp. No significant pruritus. No facial swelling. No chest pain reported to me. No sob. ?  ? ? ?Physical Exam  ? ?Triage Vital Signs: ?ED Triage Vitals  ?Enc Vitals Group  ?   BP 06/20/21 0232 (!) 158/119  ?   Pulse Rate 06/20/21 0232 (!) 115  ?   Resp 06/20/21 0232 20  ?   Temp 06/20/21 0232 99.4 ?F (37.4 ?C)  ?   Temp Source 06/20/21 0232 Oral  ?   SpO2 06/20/21 0232 97 %  ?   Weight 06/20/21 0234 56.7 kg (125 lb)  ?   Height 06/20/21 0234 1.549 m (5\' 1" )  ?   Head Circumference --   ?   Peak Flow --   ?   Pain Score 06/20/21 0234 0  ?   Pain Loc --   ?   Pain Edu? --   ?   Excl. in [place]? --   ? ? ?Most recent vital signs: ?Vitals:  ? 06/20/21 0232 06/20/21 0624  ?BP: (!) 158/119 (!) 138/105  ?Pulse: (!) 115 93  ?Resp: 20 20  ?Temp: 99.4 ?F (37.4 ?C)   ?SpO2: 97% 98%  ? ? ? ?General: Awake, no distress.  ?CV:  Good peripheral perfusion.  ?Resp:  Normal effort.  ?Abd:  No distention.  ?Other:  Mild erythema to the fingers of both hands ? ? ?ED Results / Procedures / Treatments  ? ?Labs ?(all labs ordered are listed, but only abnormal results are displayed) ?Labs Reviewed  ?COMPREHENSIVE METABOLIC PANEL - Abnormal; Notable for the following components:  ?    Result Value  ? Potassium 2.7 (*)   ? Calcium 8.8 (*)   ? All other components within normal limits  ?POC URINE PREG, ED - Normal  ?CBC  ?TROPONIN I (HIGH SENSITIVITY)  ?TROPONIN I (HIGH SENSITIVITY)  ? ? ? ?EKG ? ?ED ECG REPORT ?I, 06/22/21, the attending physician, personally viewed and interpreted this ECG. ? ?Date: 06/20/2021 ? ?Rhythm:  Tachycardia ?QRS Axis: normal ?Intervals: normal ?ST/T Wave abnormalities: nonspecific changes ? ? ? ? ?RADIOLOGY ?Chest x-ray viewed interpreted by me, no acute normality ? ? ? ?PROCEDURES: ? ?Critical Care performed:  ? ?Procedures ? ? ?MEDICATIONS ORDERED IN ED: ?Medications - No data to display ? ? ?IMPRESSION / MDM / ASSESSMENT AND PLAN / ED COURSE  ?I reviewed the triage vital signs and the nursing notes. ? ? ? ?Patient well-appearing and in no acute distress, initially tachycardic, now calmer and hr normal. Her symptoms are consistent with side effects from the Mavyret, especially the rash, fatigue and headaches, all of which appear to be common side effects.  ? ?Her lab work is notable for hypokalemia, we will replete with kdur rx. ? ?LFTs are normal. Bili is normal. ? ?She is feeling much better, without intervention. ? ?Discussed follow up with the prescriber of her hep c tx, given the importance of this medication recommended continuation if possible. ? ? ? ?  ? ? ?FINAL CLINICAL IMPRESSION(S) / ED DIAGNOSES  ? ?Final diagnoses:  ?Medication  side effect, initial encounter  ?Hypokalemia  ? ? ? ?Rx / DC Orders  ? ?ED Discharge Orders   ? ?      Ordered  ?  potassium chloride SA (KLOR-CON M) 20 MEQ tablet  2 times daily       ? 06/20/21 0713  ? ?  ?  ? ?  ? ? ? ?Note:  This document was prepared using Dragon voice recognition software and may include unintentional dictation errors. ?  ?Jene Every, MD ?06/20/21 9741 ? ?

## 2021-06-21 ENCOUNTER — Emergency Department
Admission: EM | Admit: 2021-06-21 | Discharge: 2021-06-21 | Discharge status: Against Medical Advice | Payer: Medicaid Other | Source: Home / Self Care

## 2021-06-22 ENCOUNTER — Emergency Department
Admission: EM | Admit: 2021-06-22 | Discharge: 2021-06-23 | Disposition: A | Discharge status: Home / Self Care | Payer: Medicaid Other | Attending: Emergency Medicine | Admitting: Emergency Medicine

## 2021-06-22 ENCOUNTER — Encounter: Payer: Self-pay | Admitting: Emergency Medicine

## 2021-06-22 DIAGNOSIS — L988 Other specified disorders of the skin and subcutaneous tissue: Secondary | ICD-10-CM | POA: Diagnosis not present

## 2021-06-22 DIAGNOSIS — L299 Pruritus, unspecified: Secondary | ICD-10-CM | POA: Diagnosis present

## 2021-06-22 DIAGNOSIS — L298 Other pruritus: Secondary | ICD-10-CM

## 2021-06-22 MED ORDER — HYDROXYZINE HCL 50 MG PO TABS
50.0000 mg | ORAL_TABLET | Freq: Once | ORAL | Status: AC
  Administered 2021-06-22: 50 mg via ORAL
  Filled 2021-06-22: qty 1

## 2021-06-22 MED ORDER — LORAZEPAM 0.5 MG PO TABS: 0.5000 mg | ORAL_TABLET | Freq: Three times a day (TID) | ORAL | 0 refills | Status: DC | PRN

## 2021-06-22 MED ORDER — LORAZEPAM 1 MG PO TABS
1.0000 mg | ORAL_TABLET | Freq: Once | ORAL | Status: AC
  Administered 2021-06-22: 1 mg via ORAL
  Filled 2021-06-22: qty 1

## 2021-06-22 MED ORDER — HYDROXYZINE PAMOATE 50 MG PO CAPS: 50.0000 mg | ORAL_CAPSULE | Freq: Three times a day (TID) | ORAL | 0 refills | Status: DC | PRN

## 2021-06-22 NOTE — ED Triage Notes (Signed)
Pt presents via POV with complaints of bilateral ear pain and burning in her head. She notes that she's been dealing with this for a week - recently treated for lice.  She notes taking a medication for Hep C, but she stopped taking it because she believes that this is a side effect of it. Denies SOB or CP.  ?

## 2021-06-22 NOTE — ED Provider Notes (Signed)
? ?[facility] ?Provider Note ? ?Patient Contact: 10:51 PM (approximate) ? ? ?History  ? ?Otalgia ? ? ?HPI ? ?Annette Roberts is a 36 y.o. female who presents the emergency department complaining of scalp itching.  Patient has been seen twice for this complaint.  Initially she thought she may have lice.  However the patient's significant other stated that there was no exposure to lice.  Patient then was seen with possible allergic reaction to a new medication she had started.  Patient is on a new hepatitis medication Mavyret.  She was having some edema of the throat, was treated for allergic reaction.  She still having persistent scalp itching.  No other complaints at this time. ?  ? ? ?Physical Exam  ? ?Triage Vital Signs: ?ED Triage Vitals  ?Enc Vitals Group  ?   BP 06/22/21 2024 (!) 154/93  ?   Pulse Rate 06/22/21 2024 (!) 107  ?   Resp 06/22/21 2024 20  ?   Temp 06/22/21 2024 98.9 ?F (37.2 ?C)  ?   Temp Source 06/22/21 2024 Oral  ?   SpO2 06/22/21 2024 96 %  ?   Weight 06/22/21 2026 125 lb (56.7 kg)  ?   Height 06/22/21 2026 5\' 1"  (1.549 m)  ?   Head Circumference --   ?   Peak Flow --   ?   Pain Score --   ?   Pain Loc --   ?   Pain Edu? --   ?   Excl. in GC? --   ? ? ?Most recent vital signs: ?Vitals:  ? 06/22/21 2354 06/23/21 0013  ?BP: (!) 130/104 121/67  ?Pulse: 86 71  ?Resp: 16 15  ?Temp:  98.4 ?F (36.9 ?C)  ?SpO2: 99% 99%  ? ? ? ?General: Alert and in no acute distress. ?Head: No acute traumatic findings.  No rash, mites, signs of allergic reaction or bug bites identified.  ?Cardiovascular:  Good peripheral perfusion ?Respiratory: Normal respiratory effort without tachypnea or retractions. Lungs CTAB. ?Musculoskeletal: Full range of motion to all extremities.  ?Neurologic:  No gross focal neurologic deficits are appreciated.  ?Skin:   No rash noted ?Other: ? ? ?ED Results / Procedures / Treatments  ? ?Labs ?(all labs ordered are listed, but only abnormal results are displayed) ?Labs  Reviewed - No data to display ? ? ?EKG ? ? ? ? ?RADIOLOGY ? ? ? ?No results found. ? ?PROCEDURES: ? ?Critical Care performed: No ? ?Procedures ? ? ?MEDICATIONS ORDERED IN ED: ?Medications  ?hydrOXYzine (ATARAX) tablet 50 mg (50 mg Oral Given 06/22/21 2351)  ?LORazepam (ATIVAN) tablet 1 mg (1 mg Oral Given 06/22/21 2351)  ? ? ? ?IMPRESSION / MDM / ASSESSMENT AND PLAN / ED COURSE  ?I reviewed the triage vital signs and the nursing notes. ?             ?               ? ?Differential diagnosis includes, but is not limited to, allergic reaction, bug bites, scabies, lice, tactile hallucination ? ? ?Patient's diagnosis is consistent with pruritic dermatosis of the scalp.  Patient presented to the emergency department and itching of her scalp.  She states that she has had symptoms ongoing and initially thought she may have had lice, and then allergic reaction.  Patient did start a new medication for her hepatitis roughly 2 days prior to the onset of her scalp itching.  She stopped  the medication yesterday.  I do not see any signs of bite, rash, lice on physical exam.  I will write the patient medications for symptom relief.  Husband is concerned that this may be tactile hallucination which is on the differential.  However as one of the side effects of this medication is pruritus we will treat symptomatically first.  If symptoms do not improve or the patient has worsening symptoms/any hallucinations then I would recommend follow-up for psychiatric evaluation.  However again we will treat for possible side effect of medication at this time.. Patient will be discharged home with prescriptions for Ativan and hydroxyzine. Patient is to follow up with primary care as needed or otherwise directed. Patient is given ED precautions to return to the ED for any worsening or new symptoms. ? ? ? ?  ? ? ?FINAL CLINICAL IMPRESSION(S) / ED DIAGNOSES  ? ?Final diagnoses:  ?Pruritic dermatosis of scalp  ? ? ? ?Rx / DC Orders  ? ?ED Discharge  Orders   ? ?      Ordered  ?  hydrOXYzine (VISTARIL) 50 MG capsule  3 times daily PRN       ? 06/22/21 2352  ?  LORazepam (ATIVAN) 0.5 MG tablet  Every 8 hours PRN       ? 06/22/21 2352  ? ?  ?  ? ?  ? ? ? ?Note:  This document was prepared using Dragon voice recognition software and may include unintentional dictation errors. ?  ?Racheal Patches, PA-C ?06/23/21 6301 ? ?  ?Concha Se, MD ?06/25/21 [phone] ? ?

## 2021-06-23 NOTE — Unmapped (Signed)
Checking in to see how she is doing.   LVM

## 2021-06-24 NOTE — Unmapped (Signed)
Other Call    Caller name: patient    Best callback number: [phone]   Describe the reason for the call: patient is requesting a call back from PCP or Dr Brooke Dare regarding medication   Patient states having a missed call from Dr Brooke Dare  PCP: Achille Rich, MD  Last encounter in department: 05/28/2021

## 2021-06-26 ENCOUNTER — Emergency Department: Payer: Medicaid Other

## 2021-06-26 ENCOUNTER — Emergency Department
Admission: EM | Admit: 2021-06-26 | Discharge: 2021-06-27 | Disposition: A | Discharge status: Home / Self Care | Payer: Medicaid Other | Attending: Physician Assistant | Admitting: Physician Assistant

## 2021-06-26 ENCOUNTER — Other Ambulatory Visit: Payer: Self-pay

## 2021-06-26 DIAGNOSIS — R519 Headache, unspecified: Secondary | ICD-10-CM | POA: Diagnosis not present

## 2021-06-26 DIAGNOSIS — F419 Anxiety disorder, unspecified: Secondary | ICD-10-CM | POA: Diagnosis not present

## 2021-06-26 DIAGNOSIS — L299 Pruritus, unspecified: Secondary | ICD-10-CM

## 2021-06-26 LAB — CBC WITH DIFFERENTIAL/PLATELET
Abs Immature Granulocytes: 0.01 10*3/uL (ref 0.00–0.07)
Basophils Absolute: 0 10*3/uL (ref 0.0–0.1)
Basophils Relative: 1 %
Eosinophils Absolute: 0.1 10*3/uL (ref 0.0–0.5)
Eosinophils Relative: 1 %
HCT: 38.1 % (ref 36.0–46.0)
Hemoglobin: 12.9 g/dL (ref 12.0–15.0)
Immature Granulocytes: 0 %
Lymphocytes Relative: 28 %
Lymphs Abs: 1.6 10*3/uL (ref 0.7–4.0)
MCH: 29.1 pg (ref 26.0–34.0)
MCHC: 33.9 g/dL (ref 30.0–36.0)
MCV: 85.8 fL (ref 80.0–100.0)
Monocytes Absolute: 0.3 10*3/uL (ref 0.1–1.0)
Monocytes Relative: 5 %
Neutro Abs: 3.6 10*3/uL (ref 1.7–7.7)
Neutrophils Relative %: 65 %
Platelets: 236 10*3/uL (ref 150–400)
RBC: 4.44 MIL/uL (ref 3.87–5.11)
RDW: 12.6 % (ref 11.5–15.5)
WBC: 5.6 10*3/uL (ref 4.0–10.5)
nRBC: 0 % (ref 0.0–0.2)

## 2021-06-26 LAB — BASIC METABOLIC PANEL
Anion gap: 9 (ref 5–15)
BUN: 7 mg/dL (ref 6–20)
CO2: 22 mmol/L (ref 22–32)
Calcium: 9.1 mg/dL (ref 8.9–10.3)
Chloride: 106 mmol/L (ref 98–111)
Creatinine, Ser: 0.65 mg/dL (ref 0.44–1.00)
GFR, Estimated: >60 mL/min (ref >60)
Glucose, Bld: 94 mg/dL (ref 70–99)
Potassium: 3.8 mmol/L (ref 3.5–5.1)
Sodium: 137 mmol/L (ref 135–145)

## 2021-06-26 LAB — POC URINE PREG, ED: Preg Test, Ur: NEGATIVE

## 2021-06-26 MED ORDER — ALPRAZOLAM 0.5 MG PO TABS: 0.5000 mg | ORAL_TABLET | Freq: Three times a day (TID) | ORAL | 0 refills | Status: DC | PRN

## 2021-06-26 MED ORDER — SODIUM CHLORIDE 0.9 % IV BOLUS
1000.0000 mL | Freq: Once | INTRAVENOUS | Status: AC
  Administered 2021-06-26: 1000 mL via INTRAVENOUS

## 2021-06-26 MED ORDER — LORAZEPAM 2 MG/ML IJ SOLN
1.0000 mg | Freq: Once | INTRAMUSCULAR | Status: AC
  Administered 2021-06-26: 1 mg via INTRAVENOUS
  Filled 2021-06-26: qty 1

## 2021-06-26 MED ORDER — LORAZEPAM 1 MG PO TABS: 2.0000 mg | ORAL_TABLET | Freq: Once | ORAL | Status: DC

## 2021-06-26 MED ORDER — LORAZEPAM 1 MG PO TABS
1.0000 mg | ORAL_TABLET | Freq: Once | ORAL | Status: AC
  Administered 2021-06-27: 1 mg via ORAL
  Filled 2021-06-26: qty 1

## 2021-06-26 NOTE — ED Triage Notes (Signed)
Pt presents to ER c/o scalp pain that has been going on for 7 days.  Pt describes pain as a burning sensation that does not stop.  Pt has been seen here several times for same and has not had any relief.  Pt is A&O x4 at this time in NAD in triage.   ?

## 2021-06-26 NOTE — ED Provider Notes (Signed)
? ?[facility] ?Provider Note ? ? ? None  ?  (approximate) ? ? ?History  ? ?Chief Complaint ?Headache ? ? ?HPI ?Annette Roberts is a 36 y.o. female, history of depression, cannabis use, opiate abuse, presents to the emergency department for evaluation of pruritus.  Patient states that her scalp, neck, and shoulders have been burning her for the past 7 days.  She states that she feels like there are bugs crawling on her.  Reports starting a new medication for hepatitis, Mavyret, approximately 10 days ago and states that her symptoms started shortly after.  She has been evaluated for this in the emergency department a couple times since, but has reportedly not been provided with any definitive diagnosis or therapeutic treatment.  She states that she was prescribed hydroxyzine and lorazepam last time, which works slightly, however.  She is joined by her partner, who additionally states that the patient has not been "acting right recently" and seems very anxious.  He states that the patient has been seeing things that are not necessarily there, including disturbances in her colored vision.  Denies fever/chills, chest pain, shortness of breath, abdominal pain, flank pain, nausea vomiting, diarrhea, urinary symptoms, ? ?History Limitations: No limitations. ? ?    ? ? ?Physical Exam  ?Triage Vital Signs: ?ED Triage Vitals [06/26/21 1915]  ?Enc Vitals Group  ?   BP (!) 134/108  ?   Pulse Rate 97  ?   Resp 16  ?   Temp 98.9 ?F (37.2 ?C)  ?   Temp Source Oral  ?   SpO2 99 %  ?   Weight   ?   Height   ?   Head Circumference   ?   Peak Flow   ?   Pain Score 10  ?   Pain Loc   ?   Pain Edu?   ?   Excl. in GC?   ? ? ?Most recent vital signs: ?Vitals:  ? 06/26/21 2335 06/27/21 0030  ?BP: (!) 135/92 (!) 132/98  ?Pulse: 86 91  ?Resp: 16 16  ?Temp: 98.6 ?F (37 ?C) 99.4 ?F (37.4 ?C)  ?SpO2: 100% 100%  ? ? ?General: Awake, extremely anxious. ?Skin: Warm, dry. No rashes or lesions.  ?Eyes: PERRL. Conjunctivae normal.   ?CV: Good peripheral perfusion.  ?Resp: Normal effort.  ?Abd: Soft, non-tender. No distention.  ?Neuro: At baseline. No gross neurological deficits.  ? ?Focused Exam: Scratch marks present along the scalp, but otherwise unremarkable exam.  No rashes or lesions present along the scalp, neck, shoulders, or upper extremities.  No erythema present. ? ?Physical Exam ? ? ? ?ED Results / Procedures / Treatments  ?Labs ?(all labs ordered are listed, but only abnormal results are displayed) ?Labs Reviewed  ?BASIC METABOLIC PANEL  ?CBC WITH DIFFERENTIAL/PLATELET  ?POC URINE PREG, ED  ? ? ? ?EKG ?N/A. ? ? ?RADIOLOGY ? ?ED Provider Interpretation: I personally viewed and interpreted this image, no acute findings based on my interpretation. ? ?CT HEAD WO CONTRAST ( ) ? ?Result Date: 06/26/2021 ?CLINICAL DATA:  Headache, chronic, new features or increased frequency. Symptoms over the last 2 days. EXAM: CT HEAD WITHOUT CONTRAST TECHNIQUE: Contiguous axial images were obtained from the base of the skull through the vertex without intravenous contrast. RADIATION DOSE REDUCTION: This exam was performed according to the departmental dose-optimization program which includes automated exposure control, adjustment of the mA and/or kV according to patient size and/or use of iterative reconstruction technique. COMPARISON:  07/03/2020 FINDINGS: Brain: The brain shows a normal appearance without evidence of malformation, atrophy, old or acute small or large vessel infarction, mass lesion, hemorrhage, hydrocephalus or extra-axial collection. Vascular: No hyperdense vessel. No evidence of atherosclerotic calcification. Skull: Normal.  No traumatic finding.  No focal bone lesion. Sinuses/Orbits: Sinuses are clear. Orbits appear normal. Mastoids are clear. Other: None significant IMPRESSION: Normal head CT Electronically Signed   By: Paulina Fusi M.D.   On: 06/26/2021 21:02   ? ?PROCEDURES: ? ?Critical Care performed:  None. ? ?Procedures ? ? ? ?MEDICATIONS ORDERED IN ED: ?Medications  ?sodium chloride 0.9 % bolus 1,000 mL (0 mLs Intravenous Stopped 06/27/21 0047)  ?LORazepam (ATIVAN) injection 1 mg (1 mg Intravenous Given 06/26/21 2239)  ?LORazepam (ATIVAN) tablet 1 mg (1 mg Oral Given 06/27/21 0050)  ? ? ? ?IMPRESSION / MDM / ASSESSMENT AND PLAN / ED COURSE  ?I reviewed the triage vital signs and the nursing notes. ?             ?               ? ?Differential diagnosis includes, but is not limited to, medication side effect, pruritic dermatosis scalp, schizophrenia, mania, hypokalemia. ? ?ED Course ?Patient stable, but appears extremely anxious.  Vitals are within normal limits.  We will go ahead treat with IV lorazepam. ? ?Upon reexamination, the patient's symptoms have completely subsided.  She is currently not endorsing any pruritus.  ? ?Assessment/Plan ?Unknown etiology of the patient's pruritus at this point.  Her physical exam is unremarkable.  Lab work-up is reassuring.  Head CT does not show any acute intracranial pathology.  Her symptoms appear to be have a strong link with anxiety and resolve with lorazepam.  Given the additional history of visual disturbances and bizarre behavior, I suspect that the patient's symptoms may be linked to a psychiatric disturbance.  She denies any SI/HI at this time.  No serious life-threatening pathology suspected at this point.  She is asymptomatic at this time.  Recommended that she schedule appoint with psychiatry and follow-up with her primary care provider.  We will provide her with a prescription for Xanax, which reportedly works better for her than lorazepam.  She still currently has hydroxyzine as well.  Recommend that she continue to take that as needed. ? ?Considered admission for this patient, but given her improvement in symptoms, reassuring lab work-up, and negative imaging, she is unlikely to benefit. ? ?Provided the patient with anticipatory guidance, return precautions, and  educational material. Encouraged the patient to return to the emergency department at any time if they begin to experience any new or worsening symptoms. Patient expressed understanding and agreed with the plan.  ? ?  ? ? ?FINAL CLINICAL IMPRESSION(S) / ED DIAGNOSES  ? ?Final diagnoses:  ?Pruritus  ? ? ? ?Rx / DC Orders  ? ?ED Discharge Orders   ? ?      Ordered  ?  ALPRAZolam (XANAX) 0.5 MG tablet  3 times daily PRN       ? 06/26/21 2349  ? ?  ?  ? ?  ? ? ? ?Note:  This document was prepared using Dragon voice recognition software and may include unintentional dictation errors. ?  ?Varney Daily, [place] ?06/27/21 0106 ? ?  ?Chesley Noon, MD ?06/27/21 1627 ? ?

## 2021-06-26 NOTE — Discharge Instructions (Addendum)
-  Please follow-up with your primary care provider and/or psychiatric provider, as discussed.  I recommend [facility].  You may make an appointment online. ? ?-Utilize alprazolam sparingly and only as needed. ? ?-Return to the emergency department anytime if you begin to experience any new or worsening symptoms. ?

## 2021-06-26 NOTE — ED Notes (Signed)
First nurse note- pt brought in via ems from home with a headache.  Sx for 2 days.  Pt seen here recently for similar sx.  Pt alert.   ?

## 2021-06-27 ENCOUNTER — Telehealth: Payer: Self-pay | Admitting: Emergency Medicine

## 2021-06-27 DIAGNOSIS — Z8659 Personal history of other mental and behavioral disorders: Principal | ICD-10-CM

## 2021-06-27 DIAGNOSIS — F419 Anxiety disorder, unspecified: Principal | ICD-10-CM

## 2021-06-27 DIAGNOSIS — F1121 Opioid dependence, in remission: Principal | ICD-10-CM

## 2021-06-27 MED ORDER — ALPRAZOLAM 0.5 MG PO TABS: 0.5000 mg | ORAL_TABLET | Freq: Three times a day (TID) | ORAL | 0 refills | Status: DC | PRN

## 2021-06-27 NOTE — ED Notes (Signed)
Pt verbalized understanding of discharge instructions, prescriptions, and follow-up care instructions. Pt advised if symptoms worsen to return to ED.  

## 2021-06-27 NOTE — Telephone Encounter (Cosign Needed)
Changed pharmacy from [facility] to [facility] for the alprazolam prescription as they were reportedly out of stock. Confirmed with [facility] that the patient had indeed not picked up this prescription yet. [facility] prescription cancelld. ?

## 2021-06-30 ENCOUNTER — Emergency Department
Admission: EM | Admit: 2021-06-30 | Discharge: 2021-06-30 | Discharge status: Against Medical Advice | Payer: Medicaid Other

## 2021-06-30 NOTE — ED Notes (Signed)
No answer when called several times from lobby 

## 2021-06-30 NOTE — ED Notes (Signed)
Called several times from lobby with no answer 

## 2021-06-30 NOTE — Unmapped (Signed)
----- Message from Chyrl Civatte, MD sent at 06/22/2021  6:48 AM EDT -----  Regarding: RE: mavyret side effects  If she feels like she is doing poorly from an emotional perspective off of the trileptal and she doesn't have other options due to prior treatment failures then it would be reasonable to restart.  If she has other options to treat her psychiatric symptoms I would go that route.  The trouble with the trileptal is that it interacts with all of the HCV treatments, and so there isn't a good option for switching to avoid the DDI or for a re-treatment regimen if the Mavyret doesn't lead to cure.    Rick  ----- Message -----  From: Artelia Laroche, MD  Sent: 06/21/2021   3:43 PM EDT  To: Chyrl Civatte, MD, #  Subject: RE: Hewitt Blade side effects                         Raiford Noble,   I am going to talk to pt soon, but I am thinking she really needs to be back on her trileptal (oxcarbazepine), which according to her is the only thing that has worked for her mood. The ED reports sound like she is very anxious.   Here is what [place] interactions says about mavyret and oxcarb:   Coadministration has not been formally studied and is not currently recommended. Induction of CYP3A4 and/or P-gp by oxcarbazepine may significantly decrease plasma concentrations of glecaprevir and pibrentasvir; this may lead to reduced therapeutic effect and potential virological failure. Note - there are a small number of cases reported of patients who have remained on an inducing antiepileptic during HCV DAA therapy (including with glecaprevir/pibrentasvir) and achieved a sustained virologic response. Although every effort to prevent concomitant use of glecaprevir/pibrentasvir and inducers should be made, these cases have demonstrated that clinical cure may still be achieved in patients where coadministration cannot be avoided. Therapeutic drug level monitoring could be considered, where available.    What would you think about using them together, if that seems like that might be the best choice.   Adding on her PCP, one of our residents, Lauren Gold beck.   Keola Heninger  ----- Message -----  From: Chyrl Civatte, MD  Sent: 06/20/2021   3:15 PM EDT  To: Artelia Laroche, MD  Subject: RE: mavyret side effects                         Hi Shinita Mac,  It is possible to have a rash from Mavyret.  If there's not an alternative clear cause I would monitor and treat w/ prn medications.  Even if it is from the Mavyret you wouldn't have to discontinue as long as the rash is mild and not hives.  Rick  ----- Message -----  From: Artelia Laroche, MD  Sent: 06/20/2021   2:24 PM EDT  To: Chyrl Civatte, MD  Subject: Annell Greening: mavyret side effects                         Rick,   Could this be an allergic reaction to Mavyret? She also has anxiety, made worse by her having to come off her oxcarbazepine, which was controlling her mood.   Thanks,   Sallye Ober  ----- Message -----  From: Achille Rich, MD  Sent: 06/20/2021   1:47 PM EDT  To: Sallye Ober  Venetia Constable, MD, #  Subject: mavyret side effects                             Hi Dr. Brooke Dare,    Ms. Cyr went to the ED today for what they believe is a reaction to Mavyret. Here is the HPI:     CAYLEA FORONDA is a 36 y.o. female chronic hepatitis C who started Mavyret treatment 1 week ago. She describes intermittent rashes, and headaches. No fevers/chills. Sometimes she feels a strange sensation in her scalp. No significant pruritus. No facial swelling. No chest pain reported to me. No sob.    Admin pool - would it be possible to get her scheduled for a clinic visit (ideally with Dr. Brooke Dare)?     Thanks,  Leotis Shames

## 2021-07-01 ENCOUNTER — Emergency Department
Admit: 2021-07-01 | Discharge: 2021-07-01 | Disposition: A | Discharge status: Home / Self Care | Payer: MEDICAID | Attending: Student in an Organized Health Care Education/Training Program

## 2021-07-01 ENCOUNTER — Ambulatory Visit
Admit: 2021-07-01 | Discharge: 2021-07-01 | Disposition: A | Discharge status: Home / Self Care | Payer: MEDICAID | Attending: Student in an Organized Health Care Education/Training Program

## 2021-07-01 LAB — CBC W/ AUTO DIFF
BASOPHILS ABSOLUTE COUNT: 0.1 10*9/L (ref 0.0–0.1)
BASOPHILS RELATIVE PERCENT: 1.1 %
EOSINOPHILS ABSOLUTE COUNT: 0.2 10*9/L (ref 0.0–0.5)
EOSINOPHILS RELATIVE PERCENT: 3.3 %
HEMATOCRIT: 40.1 % (ref 34.0–44.0)
HEMOGLOBIN: 13.8 g/dL (ref 11.3–14.9)
LYMPHOCYTES ABSOLUTE COUNT: 2.4 10*9/L (ref 1.1–3.6)
LYMPHOCYTES RELATIVE PERCENT: 42.1 %
MEAN CORPUSCULAR HEMOGLOBIN CONC: 34.5 g/dL (ref 32.0–36.0)
MEAN CORPUSCULAR HEMOGLOBIN: 29.7 pg (ref 25.9–32.4)
MEAN CORPUSCULAR VOLUME: 86.2 fL (ref 77.6–95.7)
MEAN PLATELET VOLUME: 8.9 fL (ref 6.8–10.7)
MONOCYTES ABSOLUTE COUNT: 0.5 10*9/L (ref 0.3–0.8)
MONOCYTES RELATIVE PERCENT: 9 %
NEUTROPHILS ABSOLUTE COUNT: 2.6 10*9/L (ref 1.8–7.8)
NEUTROPHILS RELATIVE PERCENT: 44.5 %
NUCLEATED RED BLOOD CELLS: 0 /100{WBCs} (ref <=4)
PLATELET COUNT: 210 10*9/L (ref 150–450)
RED BLOOD CELL COUNT: 4.66 10*12/L (ref 3.95–5.13)
RED CELL DISTRIBUTION WIDTH: 13.2 % (ref 12.2–15.2)
WBC ADJUSTED: 5.8 10*9/L (ref 3.6–11.2)

## 2021-07-01 LAB — COMPREHENSIVE METABOLIC PANEL
ALBUMIN: 5.4 g/dL — ABNORMAL HIGH (ref 3.4–5.0)
ALKALINE PHOSPHATASE: 81 U/L (ref 46–116)
ALT (SGPT): 10 U/L (ref 10–49)
ANION GAP: 5 mmol/L (ref 5–14)
AST (SGOT): 23 U/L (ref <=34)
BILIRUBIN TOTAL: 0.3 mg/dL (ref 0.3–1.2)
BLOOD UREA NITROGEN: 7 mg/dL — ABNORMAL LOW (ref 9–23)
BUN / CREAT RATIO: 11
CALCIUM: 9.7 mg/dL (ref 8.7–10.4)
CHLORIDE: 104 mmol/L (ref 98–107)
CO2: 28.1 mmol/L (ref 20.0–31.0)
CREATININE: 0.63 mg/dL
EGFR CKD-EPI (2021) FEMALE: >90 mL/min/{1.73_m2} (ref >=60)
GLUCOSE RANDOM: 95 mg/dL (ref 70–179)
POTASSIUM: 4.1 mmol/L (ref 3.4–4.8)
PROTEIN TOTAL: 7.3 g/dL (ref 5.7–8.2)
SODIUM: 137 mmol/L (ref 135–145)

## 2021-07-01 LAB — APTT
APTT: 29.3 s (ref 25.1–36.5)
HEPARIN CORRELATION: 0.2

## 2021-07-01 LAB — TOXICOLOGY SCREEN, URINE
AMPHETAMINE SCREEN URINE: NEGATIVE
BARBITURATE SCREEN URINE: NEGATIVE
BENZODIAZEPINE SCREEN, URINE: POSITIVE — AB
BUPRENORPHINE, URINE SCREEN: POSITIVE — AB
CANNABINOID SCREEN URINE: NEGATIVE
COCAINE(METAB.)SCREEN, URINE: NEGATIVE
FENTANYL SCREEN, URINE: NEGATIVE
METHADONE SCREEN, URINE: NEGATIVE
OPIATE SCREEN URINE: NEGATIVE
OXYCODONE SCREEN URINE: NEGATIVE

## 2021-07-01 LAB — URINALYSIS WITH MICROSCOPY WITH CULTURE REFLEX
BILIRUBIN UA: NEGATIVE
BLOOD UA: NEGATIVE
GLUCOSE UA: NEGATIVE
KETONES UA: NEGATIVE
LEUKOCYTE ESTERASE UA: NEGATIVE
NITRITE UA: NEGATIVE
PH UA: 5.5 (ref 5.0–9.0)
PROTEIN UA: 30 — AB
RBC UA: 1 /HPF (ref <=4)
SPECIFIC GRAVITY UA: 1.029 (ref 1.003–1.030)
SQUAMOUS EPITHELIAL: 9 /HPF — ABNORMAL HIGH (ref 0–5)
UROBILINOGEN UA: 2 — AB
WBC UA: 1 /HPF (ref 0–5)

## 2021-07-01 LAB — PROTIME-INR
INR: 1.05
PROTIME: 11.9 s (ref 9.8–12.8)

## 2021-07-01 LAB — TSH: THYROID STIMULATING HORMONE: 2.622 u[IU]/mL (ref 0.550–4.780)

## 2021-07-01 LAB — MAGNESIUM: MAGNESIUM: 2.2 mg/dL (ref 1.6–2.6)

## 2021-07-01 MED ORDER — ALPRAZOLAM 0.5 MG TABLET
ORAL_TABLET | Freq: Three times a day (TID) | ORAL | 0 refills | 4 days | Status: CP | PRN
Start: 2021-07-01 — End: 2021-07-06

## 2021-07-01 MED ORDER — CHLORHEXIDINE GLUCONATE 4 % TOPICAL LIQUID
Freq: Every day | TOPICAL | 0 refills | 0 days | Status: CP | PRN
Start: 2021-07-01 — End: ?

## 2021-07-01 MED ADMIN — chlorhexidine (PERIDEX) 0.12 % solution 15 mL: 15 mL | TOPICAL | @ 11:00:00 | Stop: 2021-07-01

## 2021-07-01 MED ADMIN — LORazepam (ATIVAN) injection 2 mg: 2 mg | INTRAVENOUS | @ 08:00:00 | Stop: 2021-07-01

## 2021-07-01 MED ADMIN — LORazepam (ATIVAN) injection 2 mg: 2 mg | INTRAVENOUS | @ 09:00:00 | Stop: 2021-07-01

## 2021-07-01 MED ADMIN — LORazepam (ATIVAN) injection 1 mg: 1 mg | INTRAVENOUS | @ 07:00:00 | Stop: 2021-07-01

## 2021-07-01 NOTE — Unmapped (Signed)
ED Procedure Note    Lumbar Puncture    Date/Time: 07/01/2021 5:53 AM  Performed by: Francis Dowse, MD  Authorized by: Penni Bombard, MD     Consent:     Consent obtained:  Written    Consent given by:  Patient    Risks, benefits, and alternatives were discussed: yes      Risks discussed:  Bleeding, headache, infection, nerve damage and pain    Alternatives discussed:  No treatment  Universal protocol:     Patient identity confirmed:  Verbally with patient and arm band  Pre-procedure details:     Procedure purpose:  Diagnostic    Preparation: Patient was prepped and draped in usual sterile fashion    Anesthesia:     Anesthesia method:  Local infiltration    Local anesthetic:  Lidocaine 1% w/o epi  Procedure details:     Lumbar space:  L4-L5 interspace    Patient position:  L lateral decubitus    Needle gauge:  20    Needle type:  Spinal needle - Quincke tip    Needle length (in):  2.5    Ultrasound guidance: yes      Ultrasound guidance use: view anatomy of the area      Number of attempts:  1    Opening pressure (cm H2O):  9    Fluid appearance:  Clear    Tubes of fluid:  4    Total volume (ml):  4  Post-procedure details:     Puncture site:  Adhesive bandage applied    Procedure completion:  Tolerated well, no immediate complications

## 2021-07-01 NOTE — Unmapped (Signed)
Pt bibpv c/o feeling like something is crawling around their head for the past 12 days with multiple trips for same that did not improve symptoms. Pt reports burning pain to entire head. Pt reports being treated for anxiety but doesn't think that the pain is related.

## 2021-07-02 ENCOUNTER — Emergency Department
Admission: EM | Admit: 2021-07-02 | Discharge: 2021-07-02 | Disposition: A | Discharge status: Home / Self Care | Payer: Medicaid Other | Attending: Emergency Medicine | Admitting: Emergency Medicine

## 2021-07-02 ENCOUNTER — Other Ambulatory Visit: Payer: Self-pay

## 2021-07-02 ENCOUNTER — Encounter: Payer: Self-pay | Admitting: *Deleted

## 2021-07-02 DIAGNOSIS — R519 Headache, unspecified: Secondary | ICD-10-CM | POA: Diagnosis present

## 2021-07-02 LAB — SEDIMENTATION RATE: Sed Rate: 8 mm/hr (ref 0–20)

## 2021-07-02 MED ORDER — SODIUM CHLORIDE 0.9 % IV BOLUS
1000.0000 mL | Freq: Once | INTRAVENOUS | Status: AC
  Administered 2021-07-02: 1000 mL via INTRAVENOUS

## 2021-07-02 MED ORDER — DIPHENHYDRAMINE HCL 50 MG/ML IJ SOLN
25.0000 mg | Freq: Once | INTRAMUSCULAR | Status: AC
  Administered 2021-07-02: 25 mg via INTRAVENOUS
  Filled 2021-07-02: qty 1

## 2021-07-02 MED ORDER — LORAZEPAM 1 MG PO TABS
1.0000 mg | ORAL_TABLET | Freq: Once | ORAL | Status: AC
  Administered 2021-07-02: 1 mg via ORAL
  Filled 2021-07-02: qty 1

## 2021-07-02 MED ORDER — METHYLPREDNISOLONE SODIUM SUCC 125 MG IJ SOLR
125.0000 mg | Freq: Once | INTRAMUSCULAR | Status: AC
Start: 2021-07-02 — End: 2021-07-02
  Administered 2021-07-02: 125 mg via INTRAVENOUS
  Filled 2021-07-02: qty 2

## 2021-07-02 MED ORDER — LORAZEPAM 1 MG PO TABS: 1.0000 mg | ORAL_TABLET | Freq: Three times a day (TID) | ORAL | 0 refills | Status: DC | PRN

## 2021-07-02 MED ORDER — PROCHLORPERAZINE EDISYLATE 10 MG/2ML IJ SOLN
10.0000 mg | Freq: Once | INTRAMUSCULAR | Status: AC
  Administered 2021-07-02: 10 mg via INTRAVENOUS
  Filled 2021-07-02: qty 2

## 2021-07-02 MED ORDER — LORAZEPAM 2 MG/ML IJ SOLN
1.0000 mg | Freq: Once | INTRAMUSCULAR | Status: AC
  Administered 2021-07-02: 1 mg via INTRAVENOUS
  Filled 2021-07-02: qty 1

## 2021-07-02 NOTE — Discharge Instructions (Addendum)
Please try to eat a little bit when you get home tonight and then a little bit more tomorrow.  Do not hesitate to return if you have another headache.  I will give you just a little bit of Ativan to help with your anxiety.  That will last longer than the Xanax and is less addictive.  Follow-up with your regular doctor.  If you need some help with the anxiety can see [facility].  Their information is on the other sheet of paper we gave you. ?
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
## 2021-07-02 NOTE — ED Notes (Signed)
Pt is asleep at this time.

## 2021-07-02 NOTE — ED Triage Notes (Signed)
Pt has a headache.  Pt reports n/v.  Pt seen here recently for similar sx.  Pt alert  speech clear.  Pt took aleve without relief.   ?

## 2021-07-02 NOTE — Unmapped (Signed)
[facility]  Emergency Department Provider Note    ED Clinical Impression     Final diagnoses:   Acute nonintractable headache, unspecified headache type (Primary)   Anxiety       Initial Impression, ED Course, Assessment and Plan     Impression: This is a 36 yo with a history of Hep C, IVDU in remission, and anxiety who presents with headache, anxiety, scalp pain, vision changes (reported blurriness with headaches bilaterally). She has been seen for this twice in the last 5 days. She has had a normal CT and lab work at [facility]. No fever.     On physical exam anxious affect. Normal CN II-XII, normal gait. Normal cardiopulmonary exam. VSS. No meningismus but she does report some neck pain. Scalp with scattered excoriations without evidence of infection.    With evolving symptoms, will reassess with labs, CT, and cultures with h/a with vision changes. Consented for LP if CT reassuring.     Differential includes IIH, CSF infection, medication reaction (newly started on Mavyret), migraine, viral illness, metabolic derangement, intercranial mass, intercranial abscess, no evidence of mites or bites on physical exam to scalp.     Performed LP, CT negative, lab workup reassuring. Overall headache is feeling better. She has an appointment with therapist coming up in the next two weeks and has appointment with her hepatologist in 2 days to discuss mavyret.    Plan for discharge.    ED Course as of 07/02/21 0930   Tue Jul 01, 2021   0323 CT Head Wo Contrast  negative         Additional Medical Decision Making     I have reviewed the vital signs and the nursing notes. Labs and radiology results that were available during my care of the patient were independently reviewed by me and considered in my medical decision making.     I reviewed prior records in Epic as detailed in my note.  I reviewed the radiology images independently.  I obtained collateral from partner  I was present during the procedure: LP    Portions of this record have been created using Scientist, clinical (histocompatibility and immunogenetics). Dictation errors have been sought, but may not have been identified and corrected.  ____________________________________________       History     Chief Complaint  Headache Recurrent or Known Dx Migraines      HPI   Traci Salazar is a 36 y.o. female with a history of Hep C, IVDU in remission, and anxiety who presents with headache, anxiety, scalp pain, vision changes (reported blurriness with headaches bilaterally). She has been seen for this twice in the last 5 days. She has had a normal CT and lab work at [facility]. No fever.        No past medical history on file.    Patient Active Problem List   Diagnosis    History of alcohol use disorder    Opioid use disorder in remission    Elevated blood pressure reading    Anxiety    History of depression    Chronic hepatitis C without hepatic coma (CMS-HCC)       Past Surgical History:   Procedure Laterality Date    CESAREAN SECTION      x3    NOSE SURGERY      TYMPANOSTOMY TUBE PLACEMENT         No current facility-administered medications for this encounter.    Current Outpatient Medications:  ALPRAZolam (XANAX) 0.5 MG tablet, Take 1 tablet (0.5 mg total) by mouth Three (3) times a day as needed for anxiety for up to 5 days., Disp: 12 tablet, Rfl: 0    chlorhexidine (HIBICLENS) 4 % external liquid, Apply topically daily as needed., Disp: 120 mL, Rfl: 0    glecaprevir-pibrentasvir (MAVYRET) 100-40 mg tablet, Take 3 tablets by mouth daily. Take with food., Disp: 84 tablet, Rfl: 1    hydrOXYzine (ATARAX) 25 MG tablet, Take 1 tablet (25 mg total) by mouth Three (3) times a day as needed for anxiety or other (insomnia)., Disp: 60 tablet, Rfl: 2    naloxone (NARCAN) 4 mg nasal spray, One spray in either nostril once for known/suspected opioid overdose. May repeat every 2-3 minutes in alternating nostril til EMS arrives, Disp: 2 each, Rfl: 3    SUBOXONE 8-2 mg sublingual film, Place 1 Film (8 mg of buprenorphine total) under the tongue Two (2) times a day., Disp: 82 Film, Rfl: 0    Allergies  Patient has no known allergies.    Family History   Problem Relation Age of Onset    Hypertension Mother     Substance Abuse Disorder Mother     Diabetes Father     Hypertension Father     Substance Abuse Disorder Sister     Substance Abuse Disorder Brother     Colon cancer Maternal Uncle     Colon cancer Maternal Grandmother     Colon cancer Other        Social History  Social History     Tobacco Use    Smoking status: Every Day     Packs/day: 0.50     Types: Cigarettes, e-Cigarettes     Last attempt to quit: 04/2020     Years since quitting: 1.1    Smokeless tobacco: Never    Tobacco comments:     vape   Vaping Use    Vaping Use: Every day    Substances: Nicotine, 5mg  nicotine - lasts her a week   Substance Use Topics    Alcohol use: No    Drug use: Not Currently     Types: Methamphetamines, Benzodiazepines       Review of Systems  Positive where specified in HPI  All other systems in 12 point review were negative    Physical Exam     ED Triage Vitals [06/30/21 2101]   Enc Vitals Group      BP 139/85      Heart Rate 94      SpO2 Pulse 94      Resp 18      Temp 37.3 ??C (99.1 ??F)      Temp Source Oral      SpO2 96 %      Weight       Height       Head Circumference       Peak Flow       Pain Score       Pain Loc       Pain Edu?       Excl. in GC?        Constitutional: Alert and oriented. Well appearing and in no distress.  Eyes: Conjunctivae are normal. Visual acuity normal.  ENT       Head: Normocephalic and atraumatic.       Nose: No congestion.       Mouth/Throat: Mucous membranes are moist.  Neck: No stridor.  Hematological/Lymphatic/Immunilogical: No visible masses  Cardiovascular: Normal rate, regular rhythm.   Respiratory: Normal respiratory effort.   Gastrointestinal: Soft and nontender.   Genitourinary: Deferred  Musculoskeletal: Normal range of motion in all extremities.   Neurologic: Normal speech and language. No gross focal neurologic deficits are appreciated. CN 2-12 intact.  Skin: Skin is warm, dry and intact. No rash noted.  Psychiatric: Mood and affect are anxious Speech and behavior are normal.    Radiology     CT Head Wo Contrast   Final Result      No acute intracranial abnormality.      Left frontal sinus disease.      ED POCUS Cyst Aspiration Guidance    (Results Pending)              Penni Bombard, MD  07/02/21 [phone]

## 2021-07-02 NOTE — Unmapped (Addendum)
[facility] Emergency Medicine Access Physician [facility])    I received a call from the [facility] pharmacy ([address] in [place]) about this patient.  The patient was seen in the [facility] ED yesterday where she underwent an LP.  She appears to have had severe anxiety.  Pt received an rx for alprazolam 0.5mg  disp #12 at that time.      The pharmacist informed me that the patient and her boyfriend Wylene Simmer were in the pharmacy and the patient was bawling.  [facility] policy is that they not fill controlled substances until 2d after the previous course.  Of note patient received an rx for alprazolam 0.5mg  disp #30 on 5/5 and she is not due for a refill for another two days.  She would need a doctor's permission to release the Rx before this timeframe and asked if I would be willing to do so.  I declined to renew the prescription of this controlled substance.    The patient's boyfriend Wylene Simmer called [facility] ED and was forwarded to me.  He stated that the patient needed the xanax immediately.  He states that she has an appointment tomorrow with her PMD.  I suggested that they contact the pt's PMD for this matter as it is not customary for EDs to fill xanax prescriptions.  He continued to press me, and although I did not appreciate his rude and demanding tone, given that the pharmacist had just called me about the same a few minutes ago, I agreed to speak to the pharmacy about releasing the rx of alprazolam #12 that Dr. Claretha Cooper had written yesterday.    When I called the [facility] pharmacy back, I could hear the patient's boyfriend shouting at the pharmacy staff in the background and then being ejected from the pharmacy.  I then spoke with the pharmacist and we discussed how to best approach this matter.  I offered to either release the rx to appease the patient, vs not releasing it and having the pharmacy call the police in light of this clearly problematic behavior pattern.  Pharmacist stated that she would rather call the police on them.  Therefore I agreed not to release the alprazolam and the pharmacy staff stated that they would call the police should he continue to behave in this manner.    5:23 PM  [facility] ED received yet another phone call from the patient and her boyfriend Wylene Simmer which was forwarded to me.  They are requesting that I now send the prescription that she is entitled to to somewhere else.  I declined to send a controlled substance in this manner, at which point they became verbally abusive, so I hung up the phone.  I spoke with the [facility] ED charge RN about this patient's repeated phone calls and aggressive behavior.

## 2021-07-03 ENCOUNTER — Ambulatory Visit: Admit: 2021-07-03 | Discharge: 2021-07-04 | Payer: MEDICAID

## 2021-07-03 DIAGNOSIS — F1191 Opioid use disorder in remission: Principal | ICD-10-CM

## 2021-07-03 DIAGNOSIS — R030 Elevated blood-pressure reading, without diagnosis of hypertension: Principal | ICD-10-CM

## 2021-07-03 LAB — TOXICOLOGY SCREEN, URINE
AMPHETAMINE SCREEN URINE: NEGATIVE
BARBITURATE SCREEN URINE: NEGATIVE
BENZODIAZEPINE SCREEN, URINE: POSITIVE — AB
BUPRENORPHINE, URINE SCREEN: POSITIVE — AB
CANNABINOID SCREEN URINE: NEGATIVE
COCAINE(METAB.)SCREEN, URINE: NEGATIVE
FENTANYL SCREEN, URINE: NEGATIVE
METHADONE SCREEN, URINE: NEGATIVE
OPIATE SCREEN URINE: NEGATIVE
OXYCODONE SCREEN URINE: NEGATIVE

## 2021-07-03 MED ORDER — SUBOXONE 8 MG-2 MG SUBLINGUAL FILM
ORAL_FILM | Freq: Two times a day (BID) | SUBLINGUAL | 0 refills | 30 days | Status: CP
Start: 2021-07-03 — End: 2021-08-13
  Filled 2021-07-03: qty 60, 30d supply, fill #0

## 2021-07-03 MED ORDER — ALPRAZOLAM 0.5 MG TABLET
ORAL_TABLET | Freq: Three times a day (TID) | ORAL | 0 refills | 5.00000 days | Status: CP | PRN
Start: 2021-07-03 — End: 2021-07-03

## 2021-07-03 MED ORDER — OXCARBAZEPINE 300 MG TABLET
ORAL_TABLET | Freq: Every evening | ORAL | 0 refills | 30 days | Status: CP
Start: 2021-07-03 — End: ?

## 2021-07-03 NOTE — Unmapped (Addendum)
STOP taking Mavyret.     STOP taking gabapentin.   Start taking Trileptal. For TWO weeks, take 1 tablet a day (300mg ). If you are tolerating it, then start taking 2 tablets a day (600mg  total).     For your headaches: you can continue taking aleve and can also alternate taking it with tylenol. Do not take over 2000mg  of tylenol in one day.

## 2021-07-03 NOTE — Unmapped (Unsigned)
[facility] at [facility]       Type of visit:  face to face    Reason for visit:    Questions / Concerns that need to be addressed: 4 days after she started Mavyret  started allot of side effect state head is burning pens and needles  a really bad headache started 12 days ago and severe anxiety. State vision effected     General Consent to Treat (GCT) for non-epic video visits only: Verbal consent        Screening BP- 128/96-94          Allergies reviewed: Yes    Medication reviewed: No  Pended refills? No        HCDM reviewed and updated in Epic:    We are working to make sure all of our patients??? wishes are updated in Epic and part of that is documenting a Environmental health practitioner for each patient  A Health Care Decision Maker is someone you choose who can make health care decisions for you if you are not able - who would you most want to do this for you????  is already up to date.              __________________________________________________________________________________________    SCREENINGS COMPLETED IN FLOWSHEETS    HARK Screening       AUDIT       PHQ2       PHQ9          P4 Suicidality Screener                GAD7       COPD Assessment

## 2021-07-04 MED ORDER — NALOXONE 4 MG/ACTUATION NASAL SPRAY
3 refills | 0 days | Status: CP
Start: 2021-07-04 — End: ?

## 2021-07-04 MED ORDER — OXCARBAZEPINE 300 MG TABLET
ORAL_TABLET | Freq: Every evening | ORAL | 0 refills | 30 days | Status: CP
Start: 2021-07-04 — End: ?
  Filled 2021-07-08: qty 30, 30d supply, fill #0

## 2021-07-04 MED ORDER — ALPRAZOLAM 0.5 MG TABLET
ORAL_TABLET | Freq: Three times a day (TID) | ORAL | 0 refills | 9 days | Status: CP | PRN
Start: 2021-07-04 — End: 2021-07-13
  Filled 2021-07-03: qty 15, 5d supply, fill #0
  Filled 2021-07-08: qty 25, 8d supply, fill #0

## 2021-07-04 NOTE — Unmapped (Signed)
She states that her bottle states she can get alprazolam today and the pharmacy states that she can not get until 4 days from now.  She states that the script was not written like it was previously and she states that she thinks the script was written wrong.  She is anxious and sounds as if she is about to cry.  She also needs refills on the trileptal.  What is the current therapy plan?

## 2021-07-05 ENCOUNTER — Encounter: Payer: Self-pay | Admitting: Emergency Medicine

## 2021-07-05 ENCOUNTER — Emergency Department
Admission: EM | Admit: 2021-07-05 | Discharge: 2021-07-05 | Disposition: A | Discharge status: Home / Self Care | Payer: Medicaid Other | Attending: Emergency Medicine | Admitting: Emergency Medicine

## 2021-07-05 ENCOUNTER — Other Ambulatory Visit: Payer: Self-pay

## 2021-07-05 DIAGNOSIS — R519 Headache, unspecified: Secondary | ICD-10-CM | POA: Insufficient documentation

## 2021-07-05 MED ORDER — METOCLOPRAMIDE HCL 5 MG/ML IJ SOLN
10.0000 mg | Freq: Once | INTRAMUSCULAR | Status: AC
  Administered 2021-07-05: 10 mg via INTRAVENOUS
  Filled 2021-07-05: qty 2

## 2021-07-05 MED ORDER — ACETAMINOPHEN 500 MG PO TABS
1000.0000 mg | ORAL_TABLET | Freq: Once | ORAL | Status: DC
  Filled 2021-07-05: qty 2

## 2021-07-05 MED ORDER — SODIUM CHLORIDE 0.9 % IV BOLUS
1000.0000 mL | Freq: Once | INTRAVENOUS | Status: AC
Start: 2021-07-05 — End: 2021-07-05
  Administered 2021-07-05: 1000 mL via INTRAVENOUS

## 2021-07-05 MED ORDER — DIPHENHYDRAMINE HCL 50 MG/ML IJ SOLN
25.0000 mg | Freq: Once | INTRAMUSCULAR | Status: AC
  Administered 2021-07-05: 25 mg via INTRAVENOUS
  Filled 2021-07-05: qty 1

## 2021-07-05 MED ORDER — MAGNESIUM SULFATE 2 GM/50ML IV SOLN
2.0000 g | Freq: Once | INTRAVENOUS | Status: AC
  Administered 2021-07-05: 2 g via INTRAVENOUS
  Filled 2021-07-05: qty 50

## 2021-07-05 MED ORDER — LORAZEPAM 1 MG PO TABS
1.0000 mg | ORAL_TABLET | Freq: Once | ORAL | Status: AC
  Administered 2021-07-05: 1 mg via ORAL
  Filled 2021-07-05: qty 1

## 2021-07-05 MED ORDER — PROCHLORPERAZINE EDISYLATE 10 MG/2ML IJ SOLN
10.0000 mg | Freq: Once | INTRAMUSCULAR | Status: AC
  Administered 2021-07-05: 10 mg via INTRAVENOUS
  Filled 2021-07-05: qty 2

## 2021-07-05 MED ORDER — KETOROLAC TROMETHAMINE 15 MG/ML IJ SOLN
15.0000 mg | Freq: Once | INTRAMUSCULAR | Status: AC
  Administered 2021-07-05: 15 mg via INTRAVENOUS
  Filled 2021-07-05: qty 1

## 2021-07-05 NOTE — ED Notes (Signed)
RN to bedside to talked with pt. Multiple nurses have spoken with her. She is refusing the PO tylenol and asking for ativan and compazine. RN looked at chart and pt was prescribed ativan her last visit. She advised she is not allowed to pick it up at the pharmacy per her prescriber. Will make MD aware but she is refusing all other meds at this time.  ?

## 2021-07-05 NOTE — ED Notes (Signed)
Pt refused tylenol and wanted to speak with provider.  ?

## 2021-07-05 NOTE — ED Provider Notes (Signed)
? ?[facility] ?Provider Note ? ? ? Event Date/Time  ? First MD Initiated Contact with Patient 07/05/21 1438   ?  (approximate) ? ? ?History  ? ?Headache ? ? ?HPI ? ?Latonja A Syme is a 36 y.o. female past medical history of opiate use disorder in remission on Suboxone, hepatitis C who presents with headache.  Patient notes that ever since she was started on hep C medication Mavyret she has had a severe headache.  Is located in the top of her head and is constant.  Not worse in the morning or with position.  She feels like the lights are brighter but denies visual change numbness tingling or weakness.  Denies neck pain.  Patient has been seen in the ED several times for headache.  On 5/9 she was seen at [facility] underwent CT head that was normal and a lumbar puncture which was not concerning for meningitis and had a normal opening pressure.  She was seen again on 5/10 in this ED and had a negative CT ultimately felt improved after Ativan.  She followed up with her primary care doctor the next day who started her back on Trileptal recommended that she stop the Mavyret.  Per PCP note her symptoms seem to start at the time that she started the Mavyret and at the time that she stopped taking Trileptal.  Plan is to slowly uptitrate the dose of Trileptal.  Patient notes that the only thing that really makes her headache better is the Ativan.  Was previously prescribed this but does not have any at home..  She denies fevers no history of blood clots not on birth control. ?  ? ?Past Medical History:  ?Diagnosis Date  ? Current smoker   ? Depression   ? does not want anyone incl  FOB to know  ? History of preterm premature rupture of membranes (PPROM)   ? G2  ? Low grade squamous intraepithelial lesion (LGSIL) on cervical Pap smear 2017  ? Marijuana use   ? Methadone maintenance treatment affecting pregnancy (HCC)   ? Scoliosis   ? ? ?Patient Active Problem List  ? Diagnosis Date Noted  ?  Hyponatremia   ? Drug reaction 07/03/2020  ? History of opioid abuse - on suboxone [facility]) 07/03/2020  ? Acute toxic-metabolic encephalopathy 07/03/2020  ? Delivery by cesarean section at 37-39 weeks of gestation due to labor 12/01/2018  ? Status post repeat low transverse cesarean section 11/29/2018  ? Indication for care in labor and delivery, antepartum 10/28/2018  ? Pregnancy related nausea and vomiting, antepartum 06/30/2018  ? History of preterm delivery, currently pregnant 05/25/2018  ? History of cesarean delivery, currently pregnant 05/25/2018  ? History of substance use 04/27/2018  ? Supervision of high risk pregnancy, antepartum 04/26/2018  ? Hilar lymphadenopathy 05/26/2015  ? Tobacco dependence 05/26/2015  ? Postpartum care following cesarean delivery 05/22/2015  ? Low grade squamous intraepithelial lesion (LGSIL) on cervical Pap smear 2017  ? ? ? ?Physical Exam  ?Triage Vital Signs: ?ED Triage Vitals  ?Enc Vitals Group  ?   BP 07/05/21 1442 138/85  ?   Pulse Rate 07/05/21 1442 84  ?   Resp 07/05/21 1442 14  ?   Temp 07/05/21 1442 98.1 ?F (36.7 ?C)  ?   Temp Source 07/05/21 1442 Oral  ?   SpO2 07/05/21 1442 100 %  ?   Weight 07/05/21 1432 112 lb 7 oz (51 kg)  ?   Height 07/05/21  1432 5\' 1"  (1.549 m)  ?   Head Circumference --   ?   Peak Flow --   ?   Pain Score 07/05/21 1431 10  ?   Pain Loc --   ?   Pain Edu? --   ?   Excl. in GC? --   ? ? ?Most recent vital signs: ?Vitals:  ? 07/05/21 1442 07/05/21 1828  ?BP: 138/85 122/90  ?Pulse: 84 80  ?Resp: 14 16  ?Temp: 98.1 ?F (36.7 ?C)   ?SpO2: 100% 100%  ? ? ? ?General: Awake, no distress.  ?CV:  Good peripheral perfusion.  ?Resp:  Normal effort.  ?Abd:  No distention.  ?Neuro:             Awake, Alert, Oriented x 3  ?Other:  Aox3, nml speech  ?PERRL, EOMI, face symmetric, nml tongue movement  ?5/5 strength in the BL upper and lower extremities  ?Sensation grossly intact in the BL upper and lower extremities  ?Finger-nose-finger intact BL ? ? ? ?ED Results /  Procedures / Treatments  ?Labs ?(all labs ordered are listed, but only abnormal results are displayed) ?Labs Reviewed - No data to display ? ? ?EKG ? ? ? ?RADIOLOGY ? ? ? ?PROCEDURES: ? ?Critical Care performed: No ? ?Procedures ? ? ?MEDICATIONS ORDERED IN ED: ?Medications  ?acetaminophen (TYLENOL) tablet 1,000 mg (1,000 mg Oral Patient Refused/Not Given 07/05/21 1810)  ?sodium chloride 0.9 % bolus 1,000 mL (0 mLs Intravenous Stopped 07/05/21 1855)  ?diphenhydrAMINE (BENADRYL) injection 25 mg (25 mg Intravenous Given 07/05/21 1549)  ?ketorolac (TORADOL) 15 MG/ML injection 15 mg (15 mg Intravenous Given 07/05/21 1549)  ?magnesium sulfate IVPB 2 g 50 mL (0 g Intravenous Stopped 07/05/21 1855)  ?metoCLOPramide (REGLAN) injection 10 mg (10 mg Intravenous Given 07/05/21 1549)  ?prochlorperazine (COMPAZINE) injection 10 mg (10 mg Intravenous Given 07/05/21 1838)  ?LORazepam (ATIVAN) tablet 1 mg (1 mg Oral Given 07/05/21 1840)  ? ? ? ?IMPRESSION / MDM / ASSESSMENT AND PLAN / ED COURSE  ?I reviewed the triage vital signs and the nursing notes. ?             ?               ? ?Differential diagnosis includes, but is not limited to, migraine headache, analgesic rebound, functional, tension headache, less likely subarachnoid, brain mass, brain bleed, cervical artery dissection, IIH ? ?Patient is a 36 year old female presents with almost 2 weeks of severe headache.  Started when she started her hep C medication Mavyret and when she stopped the Trileptal she was previously on.  Prior to this had not had headaches.  Has been seen in the ED multiple times and has had fairly extensive work-up with negative CT and negative LP with normal opening pressure no signs of meningitis.  Was seen just 2 days ago and symptoms proved after Ativan.  She does note that light seems somewhat brighter but otherwise denies other neurologic symptoms.  Appears somewhat uncomfortable on exam but her neurologic exam is nonfocal.  With the negative LP we have  ruled out IIH and with the duration of symptoms my suspicion for subarachnoid is quite low.  Considered other more serious pathology including cerebral venous sinus thrombosis but patient has no neurologic symptoms with normal neurologic exam and really no change in her symptoms over the last 2 weeks so my suspicion for this is lower additionally she has no risk factors.  Also considered cervical artery dissection but  again with nonfocal neurologic exam and the duration of her symptoms I think this is less likely.  Will attempt to control her symptoms today in the ED.  She did already received steroids 2 days ago.  We will give Toradol Benadryl Reglan mag and reassess. ? ?  ?On reassessment patient says her headache is somewhat improved she was resting comfortably prior to me waking her up.  It is still there however.  Will give Tylenol. ? ?Patient asked me to speak with me says that she would like some antianxiety medication as this helps with her headache and Compazine is also helped in the past.  Reviewed recent records and patient had been on Xanax but was not able to refill the last prescription because it filled too soon and pharmacy had blocked the dispense.  I explained to her that I would like to avoid using benzos for treatment of her headache because of the risk for addiction.  I agreed to give her a single dose of p.o. Ativan to see if this helps take the edge off of her headache and will give Compazine as well. ? ?Patient got up and asked nursing to take out her IV immediately was very upset that she thinks that we are thinking she is a drug user.  I spoke with the patient and told her that I was simply concerned about the addictive potential of Ativan and did not want to continue to treat her headache with this.  She ultimately walked out after IV was pulled. ? ?FINAL CLINICAL IMPRESSION(S) / ED DIAGNOSES  ? ?Final diagnoses:  ?Nonintractable headache, unspecified chronicity pattern, unspecified  headache type  ? ? ? ?Rx / DC Orders  ? ?ED Discharge Orders   ? ? None  ? ?  ? ? ? ?Note:  This document was prepared using Dragon voice recognition software and may include unintentional dictation errors. ?  ?Sidney AceMcHugh, KKirtland Bouchard

## 2021-07-05 NOTE — ED Notes (Signed)
RN to bedside. Pt requested her IV be removed immediately and she was ready to leave. Rn was concerned and asked why, she just received the medications that she requested for her headache. Pt stated " you didn't give them to me correctly. I am not a drug addict and I am leaving". Pt left building. No signature or updated vitals obtained.  ?

## 2021-07-05 NOTE — ED Triage Notes (Signed)
Pt reports HA for several weeks now. Pt states she has been seen here several times over the last week and a half for the same with no relief. Pt reports pain is like burning all over and she gets no relief with OTC meds.  ?

## 2021-07-05 NOTE — Unmapped (Addendum)
Internal Medicine Clinic Visit    Reason for visit: follow-up mood, hepatitis C, MAT    A/P:         1. Opioid use disorder in remission    2. Heroin use disorder, severe, in sustained remission (CMS-HCC)    3. Elevated blood pressure reading    4. Chronic hepatitis C without hepatic coma (CMS-HCC)    5. Anxiety    6. History of depression    7. Intractable headache, unspecified chronicity pattern, unspecified headache type        Opioid Use Disorder  In remission for about 2 years now. Doing well on suboxone, Utox positive only for benzodiazepines and suboxone as expected.   - Prescribed additional dose of benzodiazepine  - Virtual follow-up with Dr. Shelva Majestic in 1 month  - Encouraged patient to pick up narcan from pharmacy (has not yet done so)    Severe Anxiety, Tremors, Headaches - Insomnia  Hx Depression & Anxiety   She has had severe anxiety, headaches, tremors over the last few weeks, states all started about 4 to 5 days after starting Mavyret.  The timeline also correlates with cessation of Trileptal for which she had previously been taking (was tapered down/stopped due to interactions with Mavyret). She has had an extensive workup for her headaches over last two weeks (CT head, LP) all of which have been negative. Suspect her severe anxiety and physical symptoms (tremor, headache) is likely secondary to discontinuation of Trileptal.  We discussed resuming at a low dose and slowly uptitrating to her prior dose of 900 mg nightly.  Did address that Trileptal will have medication interactions most likely with other hepatitis C medications however she is having severe mental health side effects which are the priority to treat at this moment.  Once her symptoms are more stable, we will need to further discuss hepatitis C treatment.  As we wait for Trileptal to become effective for her, provided with refill of Xanax to help bridge her.  - Start trileptal 300mg  nightly for two weeks, if tolerating increase to 600mg  nightly   - she will reach out over mychart to let me know how she is feeling in a few days   - Stop gabapentin  - Stop mavyret     Chronic Hepatitis C  As above, stopping mavyret (she did discontinue a few weeks ago on her own) due to severe side effects likely related to discontinuation of trileptal. Will need to consider alternative treatment options for hepatitis C in the future after resolution of acute psychiatric symptoms as above.    No follow-ups on file.    Staffed with Dr. Dietrich Pates, seen and discussed    __________________________________________________________    HPI:    Traci Salazar is upset today, very anxious and tearful.  She has been having ongoing headaches, feelings that she is being stabbed in the head.  She has had extensive work-up in the last 2 weeks with head imaging and lumbar puncture which are negative for cause.  She is also having tremors.  All of this has been going on since about 4 to 5 days after starting Mavyret.  She discontinued Mavyret but continued to have symptoms.  She did take 100 mg of Trileptal last night, did not notice an improvement in her symptoms.    Her fianc?? reports some vision changes in that she was calling things to color different from what they actually were.  But not seeing objects that are not there, no auditory hallucinations.  States these color changes are what prompted lumbar puncture in the ED.    Denies SI.  __________________________________________________________        Medications:  Reviewed in EPIC  __________________________________________________________    Physical Exam:   Vital Signs:  Vitals:    07/03/21 1449   BP: 128/96   Pulse: 94   Temp: 37.1 ??C (98.7 ??F)   TempSrc: Temporal   SpO2: 99%   Weight: 53.3 kg (117 lb 9.6 oz)   Height: 154.9 cm (5' 1)          PTHomeBP    The patient???s Average Home Blood Pressure during the last two weeks is :   /   based on  readings            Gen: Tearful, curled up on exam table when first entering room. Neuro: Tremors of bilateral hands/arms, legs, and head   CV: Tachycardic with regular rhythm, no murmurs  Pulm: CTA bilaterally, no crackles or wheezes  Abd: ND  Psych: Unable to speak full sentences occasionally due to crying. Tearful. Anxious appearing.     PHQ-9 Score:     GAD-7 Score:       Medication adherence and barriers to the treatment plan have been addressed. Opportunities to optimize healthy behaviors have been discussed. Patient / caregiver voiced understanding.

## 2021-07-05 NOTE — Unmapped (Signed)
Specialty Medication(s): Mavyret 100-40mg     Traci Salazar has been dis-enrolled from the [facility] specialty pharmacy services due to medication discontinuation resulting from side effect intolerance.  She can be re-enrolled in the future when needed.    Additional information provided to the patient: n/a    Roderic Palau  [facility] Specialty Pharmacist

## 2021-07-08 MED ORDER — ALPRAZOLAM 0.5 MG TABLET
ORAL_TABLET | Freq: Three times a day (TID) | ORAL | 0 refills | 9 days | PRN
Start: 2021-07-08 — End: 2021-07-13

## 2021-07-09 MED ORDER — ALPRAZOLAM 0.5 MG TABLET
ORAL_TABLET | Freq: Three times a day (TID) | ORAL | 0 refills | 9 days | Status: CP | PRN
Start: 2021-07-09 — End: 2021-07-14

## 2021-07-10 ENCOUNTER — Ambulatory Visit
Admit: 2021-07-10 | Discharge: 2021-07-10 | Payer: MEDICAID | Attending: Student in an Organized Health Care Education/Training Program | Primary: Student in an Organized Health Care Education/Training Program

## 2021-07-10 ENCOUNTER — Ambulatory Visit: Admit: 2021-07-10 | Discharge: 2021-07-10 | Payer: MEDICAID

## 2021-07-10 DIAGNOSIS — F419 Anxiety disorder, unspecified: Principal | ICD-10-CM

## 2021-07-10 DIAGNOSIS — F1191 Opioid use disorder in remission: Principal | ICD-10-CM

## 2021-07-10 MED ORDER — SUBOXONE 8 MG-2 MG SUBLINGUAL FILM
ORAL_FILM | Freq: Two times a day (BID) | SUBLINGUAL | 0 refills | 28 days | Status: CP
Start: 2021-07-10 — End: 2021-08-07

## 2021-07-10 MED ORDER — OXCARBAZEPINE 300 MG TABLET
ORAL_TABLET | Freq: Every evening | ORAL | 0 refills | 30 days | Status: CP
Start: 2021-07-10 — End: ?

## 2021-07-11 DIAGNOSIS — F1191 Opioid use disorder in remission: Principal | ICD-10-CM

## 2021-07-11 DIAGNOSIS — F419 Anxiety disorder, unspecified: Principal | ICD-10-CM

## 2021-07-11 MED ORDER — SUBOXONE 8 MG-2 MG SUBLINGUAL FILM
ORAL_FILM | Freq: Two times a day (BID) | SUBLINGUAL | 0 refills | 28.00000 days | Status: CP
Start: 2021-07-11 — End: 2021-07-11

## 2021-07-11 MED ORDER — OXCARBAZEPINE 300 MG TABLET
ORAL_TABLET | Freq: Every evening | ORAL | 0 refills | 30 days | Status: CP
Start: 2021-07-11 — End: ?

## 2021-07-15 ENCOUNTER — Telehealth
Admit: 2021-07-15 | Discharge: 2021-07-16 | Payer: MEDICAID | Attending: Student in an Organized Health Care Education/Training Program | Primary: Student in an Organized Health Care Education/Training Program

## 2021-07-15 DIAGNOSIS — Z8659 Personal history of other mental and behavioral disorders: Principal | ICD-10-CM

## 2021-07-15 DIAGNOSIS — F1191 Opioid use disorder in remission: Principal | ICD-10-CM

## 2021-07-15 DIAGNOSIS — G4700 Insomnia, unspecified: Principal | ICD-10-CM

## 2021-07-15 DIAGNOSIS — F4310 Post-traumatic stress disorder, unspecified: Principal | ICD-10-CM

## 2021-07-17 ENCOUNTER — Ambulatory Visit
Admit: 2021-07-17 | Discharge: 2021-07-18 | Payer: MEDICAID | Attending: Student in an Organized Health Care Education/Training Program | Primary: Student in an Organized Health Care Education/Training Program

## 2021-07-17 DIAGNOSIS — F1191 Opioid use disorder in remission: Principal | ICD-10-CM

## 2021-07-17 DIAGNOSIS — F419 Anxiety disorder, unspecified: Principal | ICD-10-CM

## 2021-07-17 MED ORDER — QUETIAPINE 25 MG TABLET
ORAL_TABLET | Freq: Three times a day (TID) | ORAL | 3 refills | 10 days | Status: CN | PRN
Start: 2021-07-17 — End: 2021-10-15

## 2021-07-17 MED ORDER — BUPRENORPHINE 8 MG-NALOXONE 2 MG SUBLINGUAL TABLET
ORAL_TABLET | Freq: Two times a day (BID) | SUBLINGUAL | 0 refills | 28 days | Status: CP
Start: 2021-07-17 — End: 2021-08-14
  Filled 2021-07-17: qty 56, 28d supply, fill #0

## 2021-07-17 MED ORDER — OXCARBAZEPINE 150 MG TABLET
ORAL_TABLET | Freq: Two times a day (BID) | ORAL | 0 refills | 30 days | Status: CP
Start: 2021-07-17 — End: 2021-08-16
  Filled 2021-07-17: qty 60, 30d supply, fill #0

## 2021-07-31 ENCOUNTER — Ambulatory Visit
Admit: 2021-07-31 | Discharge: 2021-08-01 | Payer: MEDICAID | Attending: Student in an Organized Health Care Education/Training Program | Primary: Student in an Organized Health Care Education/Training Program

## 2021-07-31 DIAGNOSIS — F4310 Post-traumatic stress disorder, unspecified: Principal | ICD-10-CM

## 2021-07-31 DIAGNOSIS — F419 Anxiety disorder, unspecified: Principal | ICD-10-CM

## 2021-07-31 DIAGNOSIS — F1310 Sedative, hypnotic or anxiolytic abuse, uncomplicated: Principal | ICD-10-CM

## 2021-07-31 MED ORDER — VENLAFAXINE ER 75 MG CAPSULE,EXTENDED RELEASE 24 HR
ORAL_CAPSULE | Freq: Every day | ORAL | 2 refills | 30 days | Status: CP
Start: 2021-07-31 — End: 2021-10-29

## 2021-08-06 ENCOUNTER — Telehealth: Admit: 2021-08-06 | Discharge: 2021-08-07 | Payer: MEDICAID | Attending: Internal Medicine | Primary: Internal Medicine

## 2021-08-06 DIAGNOSIS — F1191 Opioid use disorder in remission: Principal | ICD-10-CM

## 2021-08-06 DIAGNOSIS — B182 Chronic viral hepatitis C: Principal | ICD-10-CM

## 2021-08-06 DIAGNOSIS — F419 Anxiety disorder, unspecified: Principal | ICD-10-CM

## 2021-08-06 MED ORDER — CLONIDINE HCL 0.1 MG TABLET
ORAL_TABLET | Freq: Two times a day (BID) | ORAL | 0 refills | 30 days | Status: CP | PRN
Start: 2021-08-06 — End: 2021-09-05
  Filled 2021-08-21: qty 60, 30d supply, fill #0

## 2021-08-06 MED ORDER — FLUOXETINE 10 MG TABLET
ORAL_TABLET | Freq: Every day | ORAL | 0 refills | 90 days | Status: CP
Start: 2021-08-06 — End: 2021-11-04
  Filled 2021-08-21: qty 90, 90d supply, fill #0

## 2021-08-11 ENCOUNTER — Other Ambulatory Visit: Payer: Self-pay

## 2021-08-11 ENCOUNTER — Emergency Department
Admission: EM | Admit: 2021-08-11 | Discharge: 2021-08-11 | Discharge status: Against Medical Advice | Payer: Medicaid Other | Attending: Emergency Medicine | Admitting: Emergency Medicine

## 2021-08-11 DIAGNOSIS — R519 Headache, unspecified: Secondary | ICD-10-CM | POA: Diagnosis present

## 2021-08-11 DIAGNOSIS — Z5321 Procedure and treatment not carried out due to patient leaving prior to being seen by health care provider: Secondary | ICD-10-CM | POA: Diagnosis not present

## 2021-08-11 NOTE — ED Triage Notes (Signed)
Pt presents to ER c/o "burning on my scalp that won't go away, and nobody is listening to me and telling me it's all in my head."  Pt states this has been an ongoing problem.  Pt states benadryl has been the only thing that has helped her.  Pt denies any other problems.  Pt is A&O x4 at this time in NAD.

## 2021-08-13 ENCOUNTER — Emergency Department
Admission: EM | Admit: 2021-08-13 | Discharge: 2021-08-13 | Disposition: A | Discharge status: Home / Self Care | Payer: Medicaid Other | Attending: Emergency Medicine | Admitting: Emergency Medicine

## 2021-08-13 ENCOUNTER — Other Ambulatory Visit: Payer: Self-pay

## 2021-08-13 DIAGNOSIS — L299 Pruritus, unspecified: Secondary | ICD-10-CM | POA: Diagnosis present

## 2021-08-13 DIAGNOSIS — L219 Seborrheic dermatitis, unspecified: Secondary | ICD-10-CM | POA: Insufficient documentation

## 2021-08-13 MED ORDER — KETOCONAZOLE 2 % EX SHAM: 1.0000 | MEDICATED_SHAMPOO | CUTANEOUS | 0 refills | Status: DC

## 2021-08-13 MED ORDER — AMOXICILLIN 875 MG PO TABS
875.0000 mg | ORAL_TABLET | Freq: Two times a day (BID) | ORAL | 0 refills | Status: DC
Start: 2021-08-13 — End: 2021-10-31

## 2021-08-13 NOTE — ED Triage Notes (Signed)
Pt presents to ED with c/o of having "some burning on my scalp". Pt concerned for "mites". Pt seen for the same recently. Pt states "they are buried in my hair".

## 2021-08-13 NOTE — ED Provider Notes (Cosign Needed)
[facility] Provider Note  Patient Contact: 8:57 PM (approximate)   History   Pruritis   HPI  Annette Roberts is a 36 y.o. female who presents to the emergency department for ongoing itching of the scalp.  Patient believes that she may have a parasite or a mite in her scalp.  Patient has been seen multiple times by other providers in the emergency department as well as myself.  Initially there was some concern that this may have been a side effect of of a hepatitis medication she was taking.  Concern was also that the patient may have hallucinogenic parasitosis.  Patient has ongoing itching of her scalp.  No other symptoms at this time.     Physical Exam   Triage Vital Signs: ED Triage Vitals [08/13/21 1744]  Enc Vitals Group     BP (!) 138/99     Pulse Rate 98     Resp 17     Temp 98.3 F (36.8 C)     Temp Source Oral     SpO2 100 %     Weight      Height      Head Circumference      Peak Flow      Pain Score 0     Pain Loc      Pain Edu?      Excl. in GC?     Most recent vital signs: Vitals:   08/13/21 1744  BP: (!) 138/99  Pulse: 98  Resp: 17  Temp: 98.3 F (36.8 C)  SpO2: 100%     General: Alert and in no acute distress. Head: No acute traumatic findings.  Patient is significant amount of excoriation on her scalp from scratching.  She has several patches of seborrheic dermatitis/dandruff.  No cellulitis.  No evidence of lice, nits, mites, scabies, insect bites.   Cardiovascular:  Good peripheral perfusion Respiratory: Normal respiratory effort without tachypnea or retractions. Lungs CTAB. Musculoskeletal: Full range of motion to all extremities.  Neurologic:  No gross focal neurologic deficits are appreciated.  Skin:   No rash noted Other:   ED Results / Procedures / Treatments   Labs (all labs ordered are listed, but only abnormal results are displayed) Labs Reviewed - No data to display   EKG     RADIOLOGY    No  results found.  PROCEDURES:  Critical Care performed: No  Procedures   MEDICATIONS ORDERED IN ED: Medications - No data to display   IMPRESSION / MDM / ASSESSMENT AND PLAN / ED COURSE  I reviewed the triage vital signs and the nursing notes.                              Differential diagnosis includes, but is not limited to, psychogenic parasitosis, tactile hallucinations, side effect of medication, lice   Patient's presentation is most consistent with acute illness / injury with system symptoms.   Patient's diagnosis is consistent with seborrheic dermatitis, dental infection.  Patient presents to the ED family concern for ongoing scalp itching.  Patient has been seen by myself multiple other providers for similar itching.  There is no evidence of lice, scabies, mites, insect bite.  She does have excoriations from scratching and findings consistent with seborrheic dermatitis.  Patient will be placed on ketoconazole shampoo, use antidandruff shampoo at home.  Encouraged the patient to use mittens or gloves to prevent further excoriation/damage  from scratching.  Initially patient had no other complaints.  As I was discharging the patient she mentioned a possible dental infection to the left lower dentition.  I will prescribe antibiotics.  Patient is no fever, facial edema, submandibular edema from her dental infection.  Follow-up with dentist for same.  Follow-up with dermatology for ongoing itch/seborrheic dermatitis..  Patient is given ED precautions to return to the ED for any worsening or new symptoms.        FINAL CLINICAL IMPRESSION(S) / ED DIAGNOSES   Final diagnoses:  Seborrheic dermatitis of scalp     Rx / DC Orders   ED Discharge Orders          Ordered    ketoconazole (NIZORAL) 2 % shampoo  2 times weekly        08/13/21 2129    amoxicillin (AMOXIL) 875 MG tablet  2 times daily        08/13/21 2129             Note:  This document was prepared using  Dragon voice recognition software and may include unintentional dictation errors.   Racheal Patches, PA-C 08/13/21 2130

## 2021-08-13 NOTE — ED Notes (Signed)
Pt to ED for what she believes to be demodex mites. States all her skin feels like sandpaper, L ear clogged up since 1 month, burning and itching and know on top of head. Came off tripleptal couple mos ago, states this is not due to coming off trileptal. States 2 kids are now getting same symptoms.

## 2021-08-14 MED ORDER — BUPRENORPHINE 8 MG-NALOXONE 2 MG SUBLINGUAL TABLET
ORAL_TABLET | Freq: Two times a day (BID) | SUBLINGUAL | 0 refills | 28 days | Status: CP
Start: 2021-08-14 — End: 2021-09-11
  Filled 2021-08-21: qty 56, 28d supply, fill #0

## 2021-08-21 ENCOUNTER — Ambulatory Visit: Admit: 2021-08-21 | Payer: MEDICAID

## 2021-08-21 DIAGNOSIS — F419 Anxiety disorder, unspecified: Principal | ICD-10-CM

## 2021-08-21 MED ORDER — OXCARBAZEPINE 150 MG TABLET
ORAL_TABLET | Freq: Two times a day (BID) | ORAL | 0 refills | 30 days | Status: CP
Start: 2021-08-21 — End: 2021-09-20
  Filled 2021-08-25: qty 60, 30d supply, fill #0

## 2021-09-10 MED ORDER — BUPRENORPHINE 8 MG-NALOXONE 2 MG SUBLINGUAL TABLET
ORAL_TABLET | Freq: Two times a day (BID) | SUBLINGUAL | 0 refills | 15.00000 days | Status: CP
Start: 2021-09-10 — End: 2021-09-25

## 2021-09-12 ENCOUNTER — Other Ambulatory Visit: Payer: Self-pay

## 2021-09-12 ENCOUNTER — Emergency Department
Admission: EM | Admit: 2021-09-12 | Discharge: 2021-09-13 | Disposition: A | Discharge status: Home / Self Care | Payer: Medicaid Other | Attending: Emergency Medicine | Admitting: Emergency Medicine

## 2021-09-12 DIAGNOSIS — L299 Pruritus, unspecified: Secondary | ICD-10-CM | POA: Diagnosis present

## 2021-09-12 DIAGNOSIS — T4145XA Adverse effect of unspecified anesthetic, initial encounter: Secondary | ICD-10-CM | POA: Diagnosis not present

## 2021-09-12 DIAGNOSIS — T887XXA Unspecified adverse effect of drug or medicament, initial encounter: Secondary | ICD-10-CM | POA: Diagnosis not present

## 2021-09-12 DIAGNOSIS — T50905A Adverse effect of unspecified drugs, medicaments and biological substances, initial encounter: Secondary | ICD-10-CM | POA: Diagnosis not present

## 2021-09-12 DIAGNOSIS — R45851 Suicidal ideations: Secondary | ICD-10-CM | POA: Diagnosis not present

## 2021-09-12 NOTE — ED Triage Notes (Signed)
First nurse note: pt presents to ER via ems from home c/o on-going scalp itching x3 months.  Pt seen here several times for same.

## 2021-09-12 NOTE — ED Triage Notes (Signed)
Pt states is having scalp skin burning. Pt appears in no acute distress. Pt without rash or hives noted.

## 2021-09-12 NOTE — ED Provider Notes (Signed)
[facility] Provider Note  Patient Contact: 10:58 PM (approximate)   History   Pruritis   HPI  Annette Roberts is a 36 y.o. female presents to the emergency department with persistent scalp burning/itching.  Patient has had this issue chronically over the past 6 to 8 weeks and has been prescribed hydroxyzine and ketoconazole shampoos in the past.  Patient reports that her symptoms are keeping her up at night and causing her extreme anxiety.  She states that she would like to be evaluated by psychiatry secondary to suicidal ideation.  No chest pain, chest tightness or abdominal pain.      Physical Exam   Triage Vital Signs: ED Triage Vitals  Enc Vitals Group     BP 09/12/21 1941 (!) 123/97     Pulse Rate 09/12/21 1941 91     Resp 09/12/21 1941 16     Temp 09/12/21 1941 98.5 F (36.9 C)     Temp Source 09/12/21 1941 Oral     SpO2 09/12/21 1941 98 %     Weight 09/12/21 1941 119 lb (54 kg)     Height 09/12/21 1941 5\' 1"  (1.549 m)     Head Circumference --      Peak Flow --      Pain Score 09/12/21 1941 10     Pain Loc --      Pain Edu? --      Excl. in GC? --     Most recent vital signs: Vitals:   09/12/21 1941  BP: (!) 123/97  Pulse: 91  Resp: 16  Temp: 98.5 F (36.9 C)  SpO2: 98%     General: Alert and in no acute distress. Eyes:  PERRL. EOMI. Head: No acute traumatic findings ENT:      Nose: No congestion/rhinnorhea.      Mouth/Throat: Mucous membranes are moist. Neck: No stridor. No cervical spine tenderness to palpation. Cardiovascular:  Good peripheral perfusion Respiratory: Normal respiratory effort without tachypnea or retractions. Lungs CTAB. Good air entry to the bases with no decreased or absent breath sounds. Gastrointestinal: Bowel sounds 4 quadrants. Soft and nontender to palpation. No guarding or rigidity. No palpable masses. No distention. No CVA tenderness. Musculoskeletal: Full range of motion to all extremities.   Neurologic:  No gross focal neurologic deficits are appreciated.  Skin:   No rash noted    ED Results / Procedures / Treatments   Labs (all labs ordered are listed, but only abnormal results are displayed) Labs Reviewed - No data to display      PROCEDURES:  Critical Care performed: No  Procedures   MEDICATIONS ORDERED IN ED: Medications - No data to display   IMPRESSION / MDM / ASSESSMENT AND PLAN / ED COURSE  I reviewed the triage vital signs and the nursing notes.                              Assessment and plan Suicidal ideation 36 year old female presents to the emergency department with suicidal ideation secondary to excessive scalp pruritus/burning.  Patient requests evaluation by psychiatry.  Psychiatric labs in process at this time and consult to psychiatry/TTS in process.     FINAL CLINICAL IMPRESSION(S) / ED DIAGNOSES   Final diagnoses:  Pruritus of scalp     Rx / DC Orders   ED Discharge Orders     None        Note:  This document was prepared using Dragon voice recognition software and may include unintentional dictation errors.   Pia Mau [place], PA-C 09/12/21 2300    Willy Eddy, MD 09/12/21 [phone]

## 2021-09-12 NOTE — ED Notes (Signed)
Pt denies any SI or HI, just complaining of scalp itching and burning

## 2021-09-13 DIAGNOSIS — T50905A Adverse effect of unspecified drugs, medicaments and biological substances, initial encounter: Secondary | ICD-10-CM

## 2021-09-13 DIAGNOSIS — L299 Pruritus, unspecified: Secondary | ICD-10-CM

## 2021-09-13 LAB — URINALYSIS, ROUTINE W REFLEX MICROSCOPIC
Bilirubin Urine: NEGATIVE
Glucose, UA: NEGATIVE mg/dL
Ketones, ur: 5 mg/dL — AB
Leukocytes,Ua: NEGATIVE
Nitrite: NEGATIVE
Protein, ur: 100 mg/dL — AB
Specific Gravity, Urine: 1.03 (ref 1.005–1.030)
pH: 5 (ref 5.0–8.0)

## 2021-09-13 LAB — URINE DRUG SCREEN, QUALITATIVE (ARMC ONLY)
Amphetamines, Ur Screen: NOT DETECTED
Barbiturates, Ur Screen: NOT DETECTED
Benzodiazepine, Ur Scrn: POSITIVE — AB
Cannabinoid 50 Ng, Ur ~~LOC~~: POSITIVE — AB
Cocaine Metabolite,Ur ~~LOC~~: NOT DETECTED
MDMA (Ecstasy)Ur Screen: NOT DETECTED
Methadone Scn, Ur: NOT DETECTED
Opiate, Ur Screen: NOT DETECTED
Phencyclidine (PCP) Ur S: NOT DETECTED
Tricyclic, Ur Screen: POSITIVE — AB

## 2021-09-13 LAB — PREGNANCY, URINE: Preg Test, Ur: NEGATIVE

## 2021-09-13 NOTE — Discharge Instructions (Signed)
Try using a shampoo designed for scalp itching, such as T-gel or Selsun Blue.  Use one of these products consistently for 2 weeks to see if it improves your symptoms.

## 2021-09-13 NOTE — ED Provider Notes (Signed)
Procedures     ----------------------------------------- 2:45 AM on 09/13/2021 ----------------------------------------- Psychiatry advises that they have evaluated the patient and that she is clear for discharge, no recommendations at this time.  They do not find any safety concerns.  Patient currently denies any SI or HI and just complains of itching of her scalp.  Will recommend Selsun or T-Gel, follow-up with primary care.     Sharman Cheek, MD 09/13/21 [phone]

## 2021-09-13 NOTE — ED Notes (Signed)
Pt verbalized understanding of discharge, Pt has ride home

## 2021-09-13 NOTE — Consult Note (Addendum)
[facility] Face-to-Face Psychiatry Consult   Reason for Consult:  Psychiatric Evaluation Referring Physician:  Dr. Katrinka Blazing Patient Identification: NOREEN MACKINTOSH MRN:  [id number] Principal Diagnosis: <principal problem not specified> Diagnosis:  Active Problems:   Drug reaction   Pruritus of scalp   Total Time spent with patient: 30 minutes  Subjective:   Linda A Brandes is a 36 y.o. female patient admitted with itch scalp causing her distress and anxiety.  HPI:  [name], 36 y.o., female patient seen by this provider; chart reviewed and consulted with Dr. Scotty Court on 09/13/21.  On evaluation Addaline A Stratmann reports that she has been complaining of an itchy scalp for several weeks now and has had no relief.  She says the issue started after she was discontinued on trileptal to start her Hep C medication.  However, she states that she was put back on trileptal to reduce withdrawal effects but has had no relief from her itching scalp. Patient is distressed tearful and anxious. She has a dermatologist appointment in a week, but says the pain and itching prevents her from sleeping and taking care of her kids properly.  She denies SI, HI and AVH. She does not pose a risk to herself or anyone else.  She is not psychotic. There is no reason for a psychiatric admission and is therefore psychiatrically cleared.    Past Psychiatric History: Substance abuser  Risk to Self:   Risk to Others:   Prior Inpatient Therapy:   Prior Outpatient Therapy:    Past Medical History:  Past Medical History:  Diagnosis Date   Current smoker    Depression    does not want anyone incl  FOB to know   History of preterm premature rupture of membranes (PPROM)    G2   Low grade squamous intraepithelial lesion (LGSIL) on cervical Pap smear 2017   Marijuana use    Methadone maintenance treatment affecting pregnancy (HCC)    Scoliosis     Past Surgical History:  Procedure Laterality Date   CESAREAN SECTION  09/10/2009    FTP/FITL, meconium   CESAREAN SECTION N/A 05/22/2015   Procedure: CESAREAN SECTION;  Surgeon: Conard Novak, MD;  Location: [facility] ORS;  Service: Obstetrics;  Laterality: N/A;   CESAREAN SECTION N/A 11/29/2018   Procedure: CESAREAN SECTION;  Surgeon: Conard Novak, MD;  Location: [facility] ORS;  Service: Obstetrics;  Laterality: N/A;   Family History:  Family History  Problem Relation Age of Onset   Diabetes Father    Hypertension Father    Heart attack Maternal Grandfather    Cancer Paternal Grandmother    Diabetes Nephew    Heart attack Maternal Aunt    Heart attack Maternal Uncle    Family Psychiatric  History: unknown Social History:  Social History   Substance and Sexual Activity  Alcohol Use No   Alcohol/week: 0.0 standard drinks of alcohol     Social History   Substance and Sexual Activity  Drug Use Yes   Types: Marijuana    Social History   Socioeconomic History   Marital status: Single    Spouse name: Not on file   Number of children: Not on file   Years of education: Not on file   Highest education level: Not on file  Occupational History   Not on file  Tobacco Use   Smoking status: Former    Packs/day: 0.50    Types: Cigarettes   Smokeless tobacco: Never  Vaping Use   Vaping  Use: Every day  Substance and Sexual Activity   Alcohol use: No    Alcohol/week: 0.0 standard drinks of alcohol   Drug use: Yes    Types: Marijuana   Sexual activity: Yes    Birth control/protection: Implant  Other Topics Concern   Not on file  Social History Narrative   Not on file   Social Determinants of Health   Financial Resource Strain: Not on file  Food Insecurity: Not on file  Transportation Needs: Not on file  Physical Activity: Not on file  Stress: Not on file  Social Connections: Not on file   Additional Social History:    Allergies:  No Known Allergies  Labs:  Results for orders placed or performed during the hospital encounter of 09/12/21 (from the  past 48 hour(s))  Urinalysis, Routine w reflex microscopic Urine, Clean Catch     Status: Abnormal   Collection Time: 09/12/21 10:58 PM  Result Value Ref Range   Color, Urine YELLOW (A) YELLOW   APPearance HAZY (A) CLEAR   Specific Gravity, Urine 1.030 1.005 - 1.030   pH 5.0 5.0 - 8.0   Glucose, UA NEGATIVE NEGATIVE mg/dL   Hgb urine dipstick LARGE (A) NEGATIVE   Bilirubin Urine NEGATIVE NEGATIVE   Ketones, ur 5 (A) NEGATIVE mg/dL   Protein, ur 258 (A) NEGATIVE mg/dL   Nitrite NEGATIVE NEGATIVE   Leukocytes,Ua NEGATIVE NEGATIVE   RBC / HPF 0-5 0 - 5 RBC/hpf   WBC, UA 0-5 0 - 5 WBC/hpf   Bacteria, UA RARE (A) NONE SEEN   Squamous Epithelial / LPF 11-20 0 - 5   Mucus PRESENT     Comment: Performed at [facility], [address] [place], [place] [address]  Pregnancy, urine     Status: None   Collection Time: 09/12/21 10:58 PM  Result Value Ref Range   Preg Test, Ur NEGATIVE NEGATIVE    Comment: Performed at [facility], [address] [place], [place] [address]  Urine Drug Screen, Qualitative ([facility] only)     Status: Abnormal   Collection Time: 09/12/21 10:58 PM  Result Value Ref Range   Tricyclic, Ur Screen POSITIVE (A) NONE DETECTED   Amphetamines, Ur Screen NONE DETECTED NONE DETECTED   MDMA (Ecstasy)Ur Screen NONE DETECTED NONE DETECTED   Cocaine Metabolite,Ur [place] NONE DETECTED NONE DETECTED   Opiate, Ur Screen NONE DETECTED NONE DETECTED   Phencyclidine (PCP) Ur S NONE DETECTED NONE DETECTED   Cannabinoid 50 Ng, Ur [place] POSITIVE (A) NONE DETECTED   Barbiturates, Ur Screen NONE DETECTED NONE DETECTED   Benzodiazepine, Ur Scrn POSITIVE (A) NONE DETECTED   Methadone Scn, Ur NONE DETECTED NONE DETECTED    Comment: (NOTE) Tricyclics + metabolites, urine    Cutoff 1000 ng/mL Amphetamines + metabolites, urine  Cutoff 1000 ng/mL MDMA (Ecstasy), urine              Cutoff 500 ng/mL Cocaine Metabolite, urine          Cutoff 300 ng/mL Opiate + metabolites, urine         Cutoff 300 ng/mL Phencyclidine (PCP), urine         Cutoff 25 ng/mL Cannabinoid, urine                 Cutoff 50 ng/mL Barbiturates + metabolites, urine  Cutoff 200 ng/mL Benzodiazepine, urine              Cutoff 200 ng/mL Methadone, urine  Cutoff 300 ng/mL  The urine drug screen provides only a preliminary, unconfirmed analytical test result and should not be used for non-medical purposes. Clinical consideration and professional judgment should be applied to any positive drug screen result due to possible interfering substances. A more specific alternate chemical method must be used in order to obtain a confirmed analytical result. Gas chromatography / mass spectrometry (GC/MS) is the preferred confirm atory method. Performed at [facility], [address] [place], [place] [address]     No current facility-administered medications for this encounter.   Current Outpatient Medications  Medication Sig Dispense Refill   ALPRAZolam (XANAX) 0.5 MG tablet Take 1 tablet (0.5 mg total) by mouth 3 (three) times daily as needed for sleep or anxiety. 30 tablet 0   amoxicillin (AMOXIL) 875 MG tablet Take 1 tablet (875 mg total) by mouth 2 (two) times daily. 14 tablet 0   Buprenorphine HCl-Naloxone HCl (SUBOXONE) 8-2 MG FILM Place 1 Film under the tongue in the morning and at bedtime. 30 each    hydrOXYzine (VISTARIL) 50 MG capsule Take 1 capsule (50 mg total) by mouth 3 (three) times daily as needed for itching. 30 capsule 0   ibuprofen (ADVIL) 600 MG tablet Take 1 tablet (600 mg total) by mouth every 8 (eight) hours as needed. 15 tablet 0   ketoconazole (NIZORAL) 2 % shampoo Apply 1 Application topically 2 (two) times a week. 120 mL 0   LORazepam (ATIVAN) 1 MG tablet Take 1 tablet (1 mg total) by mouth every 8 (eight) hours as needed for anxiety (Try to space it out more often than every 8 hours.). 10 tablet 0   potassium chloride SA (KLOR-CON M) 20 MEQ tablet  Take 1 tablet (20 mEq total) by mouth 2 (two) times daily. 30 tablet 0    Musculoskeletal: Strength & Muscle Tone: within normal limits Gait & Station: normal Patient leans: N/A            Psychiatric Specialty Exam:  Presentation  General Appearance: Appropriate for Environment  Eye Contact:Fair  Speech:Clear and Coherent  Speech Volume:Normal  Handedness:Right   Mood and Affect  Mood:Anxious  Affect:Appropriate; Tearful   Thought Process  Thought Processes:Coherent  Descriptions of Associations:Intact  Orientation:Full (Time, Place and Person)  Thought Content:WDL; Logical  History of Schizophrenia/Schizoaffective disorder:No data recorded Duration of Psychotic Symptoms:No data recorded Hallucinations:Hallucinations: None  Ideas of Reference:None  Suicidal Thoughts:Suicidal Thoughts: No  Homicidal Thoughts:Homicidal Thoughts: No   Sensorium  Memory:Immediate Good  Judgment:Good  Insight:Good   Executive Functions  Concentration:Fair  Attention Span:Fair  Recall:Fair  Fund of Knowledge:Fair  Language:Fair   Psychomotor Activity  Psychomotor Activity:Psychomotor Activity: Normal   Assets  Assets:Communication Skills; Desire for Improvement; Financial Resources/Insurance; Housing; Intimacy; Social Support   Sleep  Sleep:Sleep: Fair   Physical Exam: Physical Exam ROS Blood pressure 90/63, pulse 72, temperature 98.4 F (36.9 C), temperature source Oral, resp. rate 15, height 5\' 1"  (1.549 m), weight 54 kg, SpO2 97 %, unknown if currently breastfeeding. Body mass index is 22.48 kg/m.  Disposition: No evidence of imminent risk to self or others at present.   Patient does not meet criteria for psychiatric inpatient admission. Supportive therapy provided about ongoing stressors. Discussed crisis plan, support from social network, calling 911, coming to the Emergency Department, and calling Suicide Hotline.  ,  NP 09/13/2021 5:25 AM

## 2021-09-13 NOTE — ED Notes (Signed)
Attempted to call ride for pt with no success. Will attempt again

## 2021-09-14 ENCOUNTER — Ambulatory Visit
Admit: 2021-09-14 | Discharge: 2021-09-15 | Disposition: A | Discharge status: Other Acute Inpatient Hospital | Payer: MEDICAID

## 2021-09-25 ENCOUNTER — Ambulatory Visit: Admit: 2021-09-25 | Payer: MEDICAID

## 2021-10-08 ENCOUNTER — Ambulatory Visit: Admit: 2021-10-08 | Payer: MEDICAID | Attending: Internal Medicine | Primary: Internal Medicine

## 2021-10-15 ENCOUNTER — Ambulatory Visit: Admit: 2021-10-15 | Discharge: 2021-10-16 | Payer: MEDICAID

## 2021-10-15 DIAGNOSIS — F419 Anxiety disorder, unspecified: Principal | ICD-10-CM

## 2021-10-15 DIAGNOSIS — Z8659 Personal history of other mental and behavioral disorders: Principal | ICD-10-CM

## 2021-10-15 DIAGNOSIS — F1191 Opioid use disorder in remission: Principal | ICD-10-CM

## 2021-10-15 MED ORDER — FLUOXETINE 10 MG CAPSULE
ORAL_CAPSULE | Freq: Every day | ORAL | 0 refills | 90 days | Status: CP
Start: 2021-10-15 — End: ?

## 2021-10-15 MED ORDER — CLONIDINE HCL 0.1 MG TABLET
ORAL_TABLET | Freq: Two times a day (BID) | ORAL | 0 refills | 30 days | Status: CP | PRN
Start: 2021-10-15 — End: 2021-11-14
  Filled 2021-10-15: qty 60, 30d supply, fill #0

## 2021-10-15 MED ORDER — OXCARBAZEPINE 150 MG TABLET
ORAL_TABLET | Freq: Two times a day (BID) | ORAL | 0 refills | 30 days | Status: CP
Start: 2021-10-15 — End: ?
  Filled 2021-10-15: qty 60, 30d supply, fill #0

## 2021-10-15 MED ORDER — BUPRENORPHINE 8 MG-NALOXONE 2 MG SUBLINGUAL TABLET
ORAL_TABLET | Freq: Two times a day (BID) | SUBLINGUAL | 0 refills | 30 days | Status: CP
Start: 2021-10-15 — End: 2021-11-14
  Filled 2021-10-15: qty 60, 30d supply, fill #0

## 2021-10-31 ENCOUNTER — Emergency Department
Admission: EM | Admit: 2021-10-31 | Discharge: 2021-10-31 | Disposition: A | Discharge status: Home / Self Care | Payer: Medicaid Other | Attending: Emergency Medicine | Admitting: Emergency Medicine

## 2021-10-31 ENCOUNTER — Other Ambulatory Visit: Payer: Self-pay

## 2021-10-31 DIAGNOSIS — K121 Other forms of stomatitis: Secondary | ICD-10-CM | POA: Diagnosis not present

## 2021-10-31 DIAGNOSIS — Z20822 Contact with and (suspected) exposure to covid-19: Secondary | ICD-10-CM | POA: Diagnosis not present

## 2021-10-31 DIAGNOSIS — J029 Acute pharyngitis, unspecified: Secondary | ICD-10-CM | POA: Diagnosis present

## 2021-10-31 LAB — RESP PANEL BY RT-PCR (FLU A&B, COVID) ARPGX2
Influenza A by PCR: NEGATIVE
Influenza B by PCR: NEGATIVE
SARS Coronavirus 2 by RT PCR: NEGATIVE

## 2021-10-31 LAB — GROUP A STREP BY PCR: Group A Strep by PCR: NOT DETECTED

## 2021-10-31 MED ORDER — ACYCLOVIR 200 MG PO CAPS: 200.0000 mg | ORAL_CAPSULE | Freq: Every day | ORAL | 0 refills | Status: AC

## 2021-10-31 MED ORDER — CLOTRIMAZOLE 10 MG MT TROC
10.0000 mg | Freq: Every day | OROMUCOSAL | 0 refills | Status: DC
Start: 2021-10-31 — End: 2023-07-06

## 2021-10-31 NOTE — ED Triage Notes (Signed)
Pt come with c/o sore throat. Pt states this started week ago. Pt states sore in mouth also.

## 2021-10-31 NOTE — ED Provider Notes (Signed)
[facility] Emergency Department Provider Note     Event Date/Time   First MD Initiated Contact with Patient 10/31/21 1737     (approximate)   History   Sore Throat   HPI  Annette Roberts is a 36 y.o. female presents to the ED with 1 week of sore throat.  Patient reports symptoms last week but denies any associated fever, chills, or sweats.  She also states some sores in her mouth as well.  She denies difficulty breathing, swallowing, or controlling oral secretions.     Physical Exam   Triage Vital Signs: ED Triage Vitals  Enc Vitals Group     BP 10/31/21 1706 (!) 128/98     Pulse Rate 10/31/21 1706 83     Resp 10/31/21 1706 16     Temp 10/31/21 1706 98.2 F (36.8 C)     Temp src --      SpO2 10/31/21 1706 98 %     Weight --      Height --      Head Circumference --      Peak Flow --      Pain Score 10/31/21 1703 5     Pain Loc --      Pain Edu? --      Excl. in [place]? --     Most recent vital signs: Vitals:   10/31/21 1706  BP: (!) 128/98  Pulse: 83  Resp: 16  Temp: 98.2 F (36.8 C)  SpO2: 98%    General Awake, no distress. NAD HEENT NCAT. PERRL. EOMI. No rhinorrhea. Mucous membranes are moist. Uvula is midline and tonsils are flat. Multiple shallow, erythematous lesions to the soft and hard palate CV:  Good peripheral perfusion.  RESP:  Normal effort.  ABD:  No distention.  SKIN:  No rash  ED Results / Procedures / Treatments   Labs (all labs ordered are listed, but only abnormal results are displayed) Labs Reviewed  GROUP A STREP BY PCR  RESP PANEL BY RT-PCR (FLU A&B, COVID) ARPGX2     EKG    RADIOLOGY   No results found.   PROCEDURES:  Critical Care performed: No  Procedures   MEDICATIONS ORDERED IN ED: Medications - No data to display   IMPRESSION / MDM / ASSESSMENT AND PLAN / ED COURSE  I reviewed the triage vital signs and the nursing notes.                              Differential diagnosis  includes, but is not limited to, HFMD, stomatitis, HSV, aphthous ulcers   Patient's presentation is most consistent with acute complicated illness / injury requiring diagnostic workup.  Patient's diagnosis is consistent with stomatitis. Patient will be discharged home with prescriptions for clotrimazole troches and acyclovir. Patient is to follow up with her PCP as needed or otherwise directed. Patient is given ED precautions to return to the ED for any worsening or new symptoms.     FINAL CLINICAL IMPRESSION(S) / ED DIAGNOSES   Final diagnoses:  Stomatitis     Rx / DC Orders   ED Discharge Orders          Ordered    clotrimazole (MYCELEX) 10 MG troche  5 times daily        10/31/21 1959    acyclovir (ZOVIRAX) 200 MG capsule  5 times daily  10/31/21 2002             Note:  This document was prepared using Dragon voice recognition software and may include unintentional dictation errors.    Lissa Hoard, PA-C 11/02/21 Ulice Bold, MD 11/02/21 [phone]

## 2021-10-31 NOTE — Discharge Instructions (Addendum)
Use the prescription lozenges as directed.

## 2021-11-05 ENCOUNTER — Ambulatory Visit: Admit: 2021-11-05 | Discharge: 2021-11-06 | Payer: MEDICAID

## 2021-11-05 DIAGNOSIS — Z8659 Personal history of other mental and behavioral disorders: Principal | ICD-10-CM

## 2021-11-05 DIAGNOSIS — F419 Anxiety disorder, unspecified: Principal | ICD-10-CM

## 2021-11-05 DIAGNOSIS — F1191 Opioid use disorder in remission: Principal | ICD-10-CM

## 2021-11-05 MED ORDER — FLUOXETINE 10 MG CAPSULE
ORAL_CAPSULE | Freq: Every day | ORAL | 3 refills | 90 days | Status: CP
Start: 2021-11-05 — End: ?

## 2021-11-05 MED ORDER — CLONIDINE HCL 0.1 MG TABLET
ORAL_TABLET | Freq: Two times a day (BID) | ORAL | 0 refills | 30 days | Status: CP | PRN
Start: 2021-11-05 — End: 2021-12-05

## 2021-11-05 MED ORDER — BUPRENORPHINE 8 MG-NALOXONE 2 MG SUBLINGUAL TABLET
ORAL_TABLET | Freq: Two times a day (BID) | SUBLINGUAL | 0 refills | 30 days | Status: CP
Start: 2021-11-05 — End: 2021-12-05

## 2021-11-05 MED ORDER — OXCARBAZEPINE 300 MG TABLET
ORAL_TABLET | Freq: Two times a day (BID) | ORAL | 0 refills | 30 days | Status: CP
Start: 2021-11-05 — End: ?
  Filled 2021-11-05: qty 60, 30d supply, fill #0

## 2021-11-13 ENCOUNTER — Ambulatory Visit: Admit: 2021-11-13 | Discharge: 2021-11-14 | Payer: MEDICAID

## 2021-11-13 DIAGNOSIS — F1121 Opioid dependence, in remission: Principal | ICD-10-CM

## 2021-11-13 DIAGNOSIS — F419 Anxiety disorder, unspecified: Principal | ICD-10-CM

## 2021-11-13 DIAGNOSIS — R0789 Other chest pain: Principal | ICD-10-CM

## 2021-11-13 DIAGNOSIS — Z8659 Personal history of other mental and behavioral disorders: Principal | ICD-10-CM

## 2021-11-13 DIAGNOSIS — F1191 Opioid use disorder in remission: Principal | ICD-10-CM

## 2021-11-13 DIAGNOSIS — R0981 Nasal congestion: Principal | ICD-10-CM

## 2021-11-13 DIAGNOSIS — Z658 Other specified problems related to psychosocial circumstances: Principal | ICD-10-CM

## 2021-11-13 DIAGNOSIS — G8929 Other chronic pain: Principal | ICD-10-CM

## 2021-11-13 DIAGNOSIS — M545 Chronic low back pain, unspecified back pain laterality, unspecified whether sciatica present: Principal | ICD-10-CM

## 2021-11-13 DIAGNOSIS — B182 Chronic viral hepatitis C: Principal | ICD-10-CM

## 2021-11-13 MED ORDER — BUPRENORPHINE 8 MG-NALOXONE 2 MG SUBLINGUAL TABLET
ORAL_TABLET | Freq: Two times a day (BID) | SUBLINGUAL | 0 refills | 30 days | Status: CP
Start: 2021-11-13 — End: 2021-12-13

## 2021-11-13 MED ORDER — FLUOXETINE 10 MG CAPSULE
ORAL_CAPSULE | Freq: Every day | ORAL | 3 refills | 60 days | Status: CP
Start: 2021-11-13 — End: ?

## 2021-11-13 MED ORDER — CLOTRIMAZOLE 1 % TOPICAL OINTMENT
Freq: Two times a day (BID) | TOPICAL | 0 refills | 0.00000 days | Status: CN
Start: 2021-11-13 — End: ?

## 2021-11-13 MED ORDER — NALOXONE 4 MG/ACTUATION NASAL SPRAY
3 refills | 0 days | Status: CP
Start: 2021-11-13 — End: ?

## 2021-11-13 MED ORDER — OXCARBAZEPINE 300 MG TABLET
ORAL_TABLET | Freq: Two times a day (BID) | ORAL | 0 refills | 30 days | Status: CP
Start: 2021-11-13 — End: ?

## 2021-11-13 MED ORDER — VITAMIN B-12  1,000 MCG TABLET
ORAL_TABLET | Freq: Every day | ORAL | 3 refills | 90 days | Status: CP
Start: 2021-11-13 — End: ?

## 2021-12-11 ENCOUNTER — Ambulatory Visit: Admit: 2021-12-11 | Discharge: 2021-12-12 | Payer: MEDICAID

## 2021-12-11 DIAGNOSIS — F4310 Post-traumatic stress disorder, unspecified: Principal | ICD-10-CM

## 2021-12-11 DIAGNOSIS — Z8659 Personal history of other mental and behavioral disorders: Principal | ICD-10-CM

## 2021-12-11 DIAGNOSIS — Z309 Encounter for contraceptive management, unspecified: Principal | ICD-10-CM

## 2021-12-11 DIAGNOSIS — F1191 Opioid use disorder in remission: Principal | ICD-10-CM

## 2021-12-11 DIAGNOSIS — R1902 Left upper quadrant abdominal swelling, mass and lump: Principal | ICD-10-CM

## 2021-12-11 DIAGNOSIS — F419 Anxiety disorder, unspecified: Principal | ICD-10-CM

## 2021-12-11 MED ORDER — OXCARBAZEPINE 300 MG TABLET
ORAL_TABLET | Freq: Every evening | ORAL | 1 refills | 30 days | Status: CP
Start: 2021-12-11 — End: ?

## 2021-12-11 MED ORDER — BUPRENORPHINE 8 MG-NALOXONE 2 MG SUBLINGUAL TABLET
ORAL_TABLET | Freq: Two times a day (BID) | SUBLINGUAL | 0 refills | 30 days | Status: CP
Start: 2021-12-11 — End: 2022-01-10

## 2021-12-11 MED ORDER — CLOTRIMAZOLE 1 % TOPICAL OINTMENT
Freq: Two times a day (BID) | TOPICAL | 1 refills | 0 days | Status: CP
Start: 2021-12-11 — End: ?

## 2021-12-28 ENCOUNTER — Emergency Department
Admission: EM | Admit: 2021-12-28 | Discharge: 2021-12-28 | Disposition: A | Discharge status: Home / Self Care | Payer: Medicaid Other | Attending: Emergency Medicine | Admitting: Emergency Medicine

## 2021-12-28 DIAGNOSIS — R45851 Suicidal ideations: Secondary | ICD-10-CM | POA: Diagnosis not present

## 2021-12-28 DIAGNOSIS — R440 Auditory hallucinations: Secondary | ICD-10-CM

## 2021-12-28 DIAGNOSIS — Z1152 Encounter for screening for COVID-19: Secondary | ICD-10-CM | POA: Insufficient documentation

## 2021-12-28 DIAGNOSIS — Z87898 Personal history of other specified conditions: Secondary | ICD-10-CM | POA: Diagnosis not present

## 2021-12-28 LAB — URINE DRUG SCREEN, QUALITATIVE (ARMC ONLY)
Amphetamines, Ur Screen: NOT DETECTED
Barbiturates, Ur Screen: NOT DETECTED
Benzodiazepine, Ur Scrn: NOT DETECTED
Cannabinoid 50 Ng, Ur ~~LOC~~: POSITIVE — AB
Cocaine Metabolite,Ur ~~LOC~~: NOT DETECTED
MDMA (Ecstasy)Ur Screen: NOT DETECTED
Methadone Scn, Ur: NOT DETECTED
Opiate, Ur Screen: NOT DETECTED
Phencyclidine (PCP) Ur S: NOT DETECTED
Tricyclic, Ur Screen: NOT DETECTED

## 2021-12-28 LAB — COMPREHENSIVE METABOLIC PANEL
ALT: 22 U/L (ref 0–44)
AST: 32 U/L (ref 15–41)
Albumin: 4.6 g/dL (ref 3.5–5.0)
Alkaline Phosphatase: 59 U/L (ref 38–126)
Anion gap: 7 (ref 5–15)
BUN: 8 mg/dL (ref 6–20)
CO2: 23 mmol/L (ref 22–32)
Calcium: 9.3 mg/dL (ref 8.9–10.3)
Chloride: 109 mmol/L (ref 98–111)
Creatinine, Ser: 0.7 mg/dL (ref 0.44–1.00)
GFR, Estimated: >60 mL/min (ref >60)
Glucose, Bld: 116 mg/dL — ABNORMAL HIGH (ref 70–99)
Potassium: 4 mmol/L (ref 3.5–5.1)
Sodium: 139 mmol/L (ref 135–145)
Total Bilirubin: 0.3 mg/dL (ref 0.3–1.2)
Total Protein: 7.9 g/dL (ref 6.5–8.1)

## 2021-12-28 LAB — RESP PANEL BY RT-PCR (FLU A&B, COVID) ARPGX2
Influenza A by PCR: NEGATIVE
Influenza B by PCR: NEGATIVE
SARS Coronavirus 2 by RT PCR: NEGATIVE

## 2021-12-28 LAB — CBC WITH DIFFERENTIAL/PLATELET
Abs Immature Granulocytes: 0.02 10*3/uL (ref 0.00–0.07)
Basophils Absolute: 0 10*3/uL (ref 0.0–0.1)
Basophils Relative: 1 %
Eosinophils Absolute: 0.1 10*3/uL (ref 0.0–0.5)
Eosinophils Relative: 2 %
HCT: 36.8 % (ref 36.0–46.0)
Hemoglobin: 12.2 g/dL (ref 12.0–15.0)
Immature Granulocytes: 0 %
Lymphocytes Relative: 33 %
Lymphs Abs: 2 10*3/uL (ref 0.7–4.0)
MCH: 28.4 pg (ref 26.0–34.0)
MCHC: 33.2 g/dL (ref 30.0–36.0)
MCV: 85.6 fL (ref 80.0–100.0)
Monocytes Absolute: 0.6 10*3/uL (ref 0.1–1.0)
Monocytes Relative: 10 %
Neutro Abs: 3.2 10*3/uL (ref 1.7–7.7)
Neutrophils Relative %: 54 %
Platelets: 228 10*3/uL (ref 150–400)
RBC: 4.3 MIL/uL (ref 3.87–5.11)
RDW: 12.9 % (ref 11.5–15.5)
WBC: 6 10*3/uL (ref 4.0–10.5)
nRBC: 0 % (ref 0.0–0.2)

## 2021-12-28 LAB — ACETAMINOPHEN LEVEL: Acetaminophen (Tylenol), Serum: <10 ug/mL — ABNORMAL LOW (ref 10–30)

## 2021-12-28 LAB — PREGNANCY, URINE: Preg Test, Ur: NEGATIVE

## 2021-12-28 LAB — SALICYLATE LEVEL: Salicylate Lvl: <7 mg/dL — ABNORMAL LOW (ref 7.0–30.0)

## 2021-12-28 LAB — ETHANOL: Alcohol, Ethyl (B): <10 mg/dL (ref <10)

## 2021-12-28 NOTE — ED Notes (Signed)
Pt given sandwich tray and drink per her request.

## 2021-12-28 NOTE — ED Notes (Signed)
Initial triage and vitals charting done on paper and to be scanned in due to system downtime.

## 2021-12-28 NOTE — ED Provider Notes (Signed)
[facility] Provider Note   Event Date/Time   First MD Initiated Contact with Patient 12/28/21 [phone]     (approximate) History  No chief complaint on file.  HPI Annette Roberts is a 36 y.o. female with past medical history of depression and polysubstance abuse who presents complaining of suicidal ideation and auditory hallucinations.  Patient states that she has stopped her regularly prescribed medications due to insurance/financial issues and/or patient feeling as if she does not need them anymore.  Patient states that she has had suicidal ideation without a definitive plan and states that her auditory hallucinations are not commanding her to do anything to herself but are overall negative.  Patient currently denies any homicidal ideation, suicidal ideation, or visual hallucinations ROS: Patient currently denies any vision changes, tinnitus, difficulty speaking, facial droop, sore throat, chest pain, shortness of breath, abdominal pain, nausea/vomiting/diarrhea, dysuria, or weakness/numbness/paresthesias in any extremity   Physical Exam  Triage Vital Signs: ED Triage Vitals [12/28/21 0213]  Enc Vitals Group     BP      Pulse      Resp      Temp      Temp src      SpO2      Weight      Height      Head Circumference      Peak Flow      Pain Score 0     Pain Loc      Pain Edu?      Excl. in [place]?    Most recent vital signs: There were no vitals filed for this visit. General: Awake, oriented x4. CV:  Good peripheral perfusion.  Resp:  Normal effort.  Abd:  No distention.  Other:  Middle-aged Caucasian female laying in bed in no acute distress.  Not responding to internal stimuli ED Results / Procedures / Treatments  Labs (all labs ordered are listed, but only abnormal results are displayed) Labs Reviewed  RESP PANEL BY RT-PCR (FLU A&B, COVID) ARPGX2  CBC WITH DIFFERENTIAL/PLATELET  ACETAMINOPHEN LEVEL  COMPREHENSIVE METABOLIC PANEL  SALICYLATE LEVEL   URINE DRUG SCREEN, QUALITATIVE ([facility] ONLY)  POC URINE PREG, ED   PROCEDURES: Critical Care performed: No Procedures MEDICATIONS ORDERED IN ED: Medications - No data to display IMPRESSION / MDM / [place] / ED COURSE  I reviewed the triage vital signs and the nursing notes.                             Patient's presentation is most consistent with acute presentation with potential threat to life or bodily function. Patient presents under IVC for hallucinations/delusions. Thoughts are disorganized. No history of prior suicide attempt, and no SI or HI at this time. Clinically w/ no overt toxidrome, low suspicion for ingestion given hx and exam Thoughts unlikely 2/2 anemia, hypothyroidism, infection, or ICH. Patients decision making capacity is compromised and they are unable to perform all ADLs (additionally they are without appropriate caretakers to assist through this deficit).  Consult: Psychiatry to evaluate patient for grave disability Disposition: Pending psychiatric evaluation  Patient was evaluated by psychiatry prior to discharge and was not found to be a candidate for inpatient admission at this time.  Prior to reassessment in the morning, patient desired to be discharged and follow-up as an outpatient in the psychiatric clinics.  Patient given resources for follow-up prior to discharge and all questions were answered.  Dispo: Discharge home   FINAL CLINICAL IMPRESSION(S) / ED DIAGNOSES   Final diagnoses:  Suicidal ideation  Auditory hallucinations   Rx / DC Orders   ED Discharge Orders     None      Note:  This document was prepared using Dragon voice recognition software and may include unintentional dictation errors.   Merwyn Katos, MD 12/28/21 [phone]

## 2021-12-28 NOTE — Consult Note (Signed)
[facility] Face-to-Face Psychiatry Consult   Reason for Consult:  Psych evaluation  Referring Physician:  Dr. Cheri Fowler Patient Identification: Annette Roberts MRN:  [id number] Principal Diagnosis: History of substance use Diagnosis:  Principal Problem:   History of substance use   Total Time spent with patient: 45 minutes  Subjective:   " Ive been feeling bad ever since I began taking the [place] again"  HPI:  Annette Roberts, 36 y.o., female patient seen by this provider; chart reviewed and consulted with Dr. Cheri Fowler on 12/28/21.  On evaluation Annette Roberts reports  that she has recently stopped taking her oxcabamazapine due to running out of the medication.  She states her provider changed the dosage and that medicaid would not fill it.  She says as a result, she has been finding it difficult to sleep.  Upon further assessing it has been said by the patient that she takes more than what is prescribed and has run out of it.  She also admits to abusing Unisom.  Silvano Bilis is an over the counter sleep aid.  The directed amount to take is 1 tablet 30 minutes before bedtime.  She admits to taking 15 tablets daily.  Excess Unisom can lead to seizure, hallucinations, and restlessness.  She also take 8mg  suboxone strips 2x daily.  Patient has an appt with her provider 11/8 and will discuss medication adjustments.    Annette Roberts is anxious, jittery; she is alert/oriented x 4; anxious/cooperative; and mood congruent with affect.  Patient is speaking in a clear tone at moderate volume, and fast pace; with poor eye contact.  Her thought process is coherent and relevant; There is no indication that she is currently responding to internal/external stimuli or experiencing delusional thought content.  Patient denies suicidal/self-harm/homicidal ideation, psychosis, and paranoia.    Past Psychiatric History: substance abuse   Risk to Self:   Risk to Others:   Prior Inpatient Therapy:   Prior Outpatient Therapy:    Past  Medical History:  Past Medical History:  Diagnosis Date   Current smoker    Depression    does not want anyone incl  FOB to know   History of preterm premature rupture of membranes (PPROM)    G2   Low grade squamous intraepithelial lesion (LGSIL) on cervical Pap smear 2017   Marijuana use    Methadone maintenance treatment affecting pregnancy ([place])    Scoliosis     Past Surgical History:  Procedure Laterality Date   CESAREAN SECTION  09/10/2009   FTP/FITL, meconium   CESAREAN SECTION N/A 05/22/2015   Procedure: CESAREAN SECTION;  Surgeon: Will Bonnet, MD;  Location: [facility] ORS;  Service: Obstetrics;  Laterality: N/A;   CESAREAN SECTION N/A 11/29/2018   Procedure: CESAREAN SECTION;  Surgeon: Will Bonnet, MD;  Location: [facility] ORS;  Service: Obstetrics;  Laterality: N/A;   Family History:  Family History  Problem Relation Age of Onset   Diabetes Father    Hypertension Father    Heart attack Maternal Grandfather    Cancer Paternal Grandmother    Diabetes Nephew    Heart attack Maternal Aunt    Heart attack Maternal Uncle    Family Psychiatric  History:  Social History:  Social History   Substance and Sexual Activity  Alcohol Use No   Alcohol/week: 0.0 standard drinks of alcohol     Social History   Substance and Sexual Activity  Drug Use Yes   Types: Marijuana    Social History  Socioeconomic History   Marital status: Single    Spouse name: Not on file   Number of children: Not on file   Years of education: Not on file   Highest education level: Not on file  Occupational History   Not on file  Tobacco Use   Smoking status: Former    Packs/day: 0.50    Types: Cigarettes   Smokeless tobacco: Never  Vaping Use   Vaping Use: Every day  Substance and Sexual Activity   Alcohol use: No    Alcohol/week: 0.0 standard drinks of alcohol   Drug use: Yes    Types: Marijuana   Sexual activity: Yes    Birth control/protection: Implant  Other Topics Concern    Not on file  Social History Narrative   Not on file   Social Determinants of Health   Financial Resource Strain: Not on file  Food Insecurity: Not on file  Transportation Needs: Not on file  Physical Activity: Not on file  Stress: Not on file  Social Connections: Not on file   Additional Social History:    Allergies:  No Known Allergies  Labs:  Results for orders placed or performed during the hospital encounter of 12/28/21 (from the past 48 hour(s))  Acetaminophen level     Status: Abnormal   Collection Time: 12/28/21  1:30 AM  Result Value Ref Range   Acetaminophen (Tylenol), Serum <10 (L) 10 - 30 ug/mL    Comment: (NOTE) Therapeutic concentrations vary significantly. A range of 10-30 ug/mL  may be an effective concentration for many patients. However, some  are best treated at concentrations outside of this range. Acetaminophen concentrations >150 ug/mL at 4 hours after ingestion  and >50 ug/mL at 12 hours after ingestion are often associated with  toxic reactions.  Performed at [facility], [address] [place], [place] [address]   Resp Panel by RT-PCR (Flu A&B, Covid)     Status: None   Collection Time: 12/28/21  1:30 AM  Result Value Ref Range   SARS Coronavirus 2 by RT PCR NEGATIVE NEGATIVE    Comment: (NOTE) SARS-CoV-2 target nucleic acids are NOT DETECTED.  The SARS-CoV-2 RNA is generally detectable in upper respiratory specimens during the acute phase of infection. The lowest concentration of SARS-CoV-2 viral copies this assay can detect is 138 copies/mL. A negative result does not preclude SARS-Cov-2 infection and should not be used as the sole basis for treatment or other patient management decisions. A negative result may occur with  improper specimen collection/handling, submission of specimen other than nasopharyngeal swab, presence of viral mutation(s) within the areas targeted by this assay, and inadequate number of  viral copies(<138 copies/mL). A negative result must be combined with clinical observations, patient history, and epidemiological information. The expected result is Negative.  Fact Sheet for Patients:  EntrepreneurPulse.com.au  Fact Sheet for Healthcare Providers:  IncredibleEmployment.be  This test is no t yet approved or cleared by the [place] FDA and  has been authorized for detection and/or diagnosis of SARS-CoV-2 by FDA under an Emergency Use Authorization (EUA). This EUA will remain  in effect (meaning this test can be used) for the duration of the COVID-19 declaration under Section 564(b)(1) of the Act, 21 U.S.C.section 360bbb-3(b)(1), unless the authorization is terminated  or revoked sooner.       Influenza A by PCR NEGATIVE NEGATIVE   Influenza B by PCR NEGATIVE NEGATIVE    Comment: (NOTE) The Xpert Xpress SARS-CoV-2/FLU/RSV plus assay is intended  as an aid in the diagnosis of influenza from Nasopharyngeal swab specimens and should not be used as a sole basis for treatment. Nasal washings and aspirates are unacceptable for Xpert Xpress SARS-CoV-2/FLU/RSV testing.  Fact Sheet for Patients: EntrepreneurPulse.com.au  Fact Sheet for Healthcare Providers: IncredibleEmployment.be  This test is not yet approved or cleared by the [place] FDA and has been authorized for detection and/or diagnosis of SARS-CoV-2 by FDA under an Emergency Use Authorization (EUA). This EUA will remain in effect (meaning this test can be used) for the duration of the COVID-19 declaration under Section 564(b)(1) of the Act, 21 U.S.C. section 360bbb-3(b)(1), unless the authorization is terminated or revoked.  Performed at [facility], [address] [place], [place] [address]   CBC with Differential/Platelet     Status: None   Collection Time: 12/28/21  1:30 AM  Result Value Ref Range   WBC 6.0  4.0 - 10.5 K/uL   RBC 4.30 3.87 - 5.11 MIL/uL   Hemoglobin 12.2 12.0 - 15.0 g/dL   HCT 36.8 36.0 - 46.0 %   MCV 85.6 80.0 - 100.0 fL   MCH 28.4 26.0 - 34.0 pg   MCHC 33.2 30.0 - 36.0 g/dL   RDW 12.9 11.5 - 15.5 %   Platelets 228 150 - 400 K/uL   nRBC 0.0 0.0 - 0.2 %   Neutrophils Relative % 54 %   Neutro Abs 3.2 1.7 - 7.7 K/uL   Lymphocytes Relative 33 %   Lymphs Abs 2.0 0.7 - 4.0 K/uL   Monocytes Relative 10 %   Monocytes Absolute 0.6 0.1 - 1.0 K/uL   Eosinophils Relative 2 %   Eosinophils Absolute 0.1 0.0 - 0.5 K/uL   Basophils Relative 1 %   Basophils Absolute 0.0 0.0 - 0.1 K/uL   Immature Granulocytes 0 %   Abs Immature Granulocytes 0.02 0.00 - 0.07 K/uL    Comment: Performed at [facility], [address] [place], [place] [address]  Comprehensive metabolic panel     Status: Abnormal   Collection Time: 12/28/21  1:30 AM  Result Value Ref Range   Sodium 139 135 - 145 mmol/L   Potassium 4.0 3.5 - 5.1 mmol/L   Chloride 109 98 - 111 mmol/L   CO2 23 22 - 32 mmol/L   Glucose, Bld 116 (H) 70 - 99 mg/dL    Comment: Glucose reference range applies only to samples taken after fasting for at least 8 hours.   BUN 8 6 - 20 mg/dL   Creatinine, Ser 0.70 0.44 - 1.00 mg/dL   Calcium 9.3 8.9 - 10.3 mg/dL   Total Protein 7.9 6.5 - 8.1 g/dL   Albumin 4.6 3.5 - 5.0 g/dL   AST 32 15 - 41 U/L   ALT 22 0 - 44 U/L   Alkaline Phosphatase 59 38 - 126 U/L   Total Bilirubin 0.3 0.3 - 1.2 mg/dL   GFR, Estimated >60 >60 mL/min    Comment: (NOTE) Calculated using the CKD-EPI Creatinine Equation (2021)    Anion gap 7 5 - 15    Comment: Performed at [facility], [address] [place], [place] [id number]  Salicylate level     Status: Abnormal   Collection Time: 12/28/21  1:30 AM  Result Value Ref Range   Salicylate Lvl Q000111Q (L) 7.0 - 30.0 mg/dL    Comment: Performed at [facility], [address] [place], [place] [address]  Urine Drug Screen, Qualitative [facility]  only)     Status: Abnormal   Collection Time: 12/28/21  1:30 AM  Result Value Ref Range   Tricyclic, Ur Screen NONE DETECTED NONE DETECTED   Amphetamines, Ur Screen NONE DETECTED NONE DETECTED   MDMA (Ecstasy)Ur Screen NONE DETECTED NONE DETECTED   Cocaine Metabolite,Ur [place] NONE DETECTED NONE DETECTED   Opiate, Ur Screen NONE DETECTED NONE DETECTED   Phencyclidine (PCP) Ur S NONE DETECTED NONE DETECTED   Cannabinoid 50 Ng, Ur [place] POSITIVE (A) NONE DETECTED   Barbiturates, Ur Screen NONE DETECTED NONE DETECTED   Benzodiazepine, Ur Scrn NONE DETECTED NONE DETECTED   Methadone Scn, Ur NONE DETECTED NONE DETECTED    Comment: (NOTE) Tricyclics + metabolites, urine    Cutoff 1000 ng/mL Amphetamines + metabolites, urine  Cutoff 1000 ng/mL MDMA (Ecstasy), urine              Cutoff 500 ng/mL Cocaine Metabolite, urine          Cutoff 300 ng/mL Opiate + metabolites, urine        Cutoff 300 ng/mL Phencyclidine (PCP), urine         Cutoff 25 ng/mL Cannabinoid, urine                 Cutoff 50 ng/mL Barbiturates + metabolites, urine  Cutoff 200 ng/mL Benzodiazepine, urine              Cutoff 200 ng/mL Methadone, urine                   Cutoff 300 ng/mL  The urine drug screen provides only a preliminary, unconfirmed analytical test result and should not be used for non-medical purposes. Clinical consideration and professional judgment should be applied to any positive drug screen result due to possible interfering substances. A more specific alternate chemical method must be used in order to obtain a confirmed analytical result. Gas chromatography / mass spectrometry (GC/MS) is the preferred confirm atory method. Performed at [facility], [address] [place], [place] [address]   Ethanol     Status: None   Collection Time: 12/28/21  1:30 AM  Result Value Ref Range   Alcohol, Ethyl (B) <10 <10 mg/dL    Comment: (NOTE) Lowest detectable limit for serum alcohol is 10 mg/dL.  For  medical purposes only. Performed at [facility], [address] [place], [place] [address]   Pregnancy, urine     Status: None   Collection Time: 12/28/21  1:30 AM  Result Value Ref Range   Preg Test, Ur NEGATIVE NEGATIVE    Comment: Performed at [facility], [address] [place],  [address]    No current facility-administered medications for this encounter.   Current Outpatient Medications  Medication Sig Dispense Refill   Buprenorphine HCl-Naloxone HCl (SUBOXONE) 8-2 MG FILM Place 1 Film under the tongue in the morning and at bedtime. 30 each    clotrimazole (MYCELEX) 10 MG troche Take 1 tablet (10 mg total) by mouth 5 (five) times daily. (Patient not taking: Reported on 12/28/2021) 35 tablet 0   cyanocobalamin (VITAMIN B12) 1000 MCG tablet Take 1 tablet by mouth daily. (Patient not taking: Reported on 12/28/2021)     hydrOXYzine (ATARAX) 25 MG tablet Take 25 mg by mouth every 8 (eight) hours as needed.     Oxcarbazepine (TRILEPTAL) 300 MG tablet Take 300 mg by mouth daily. (Patient not taking: Reported on 12/28/2021)      Musculoskeletal: Strength & Muscle Tone:  within normal limits Gait & Station: normal Patient leans: N/A  Psychiatric Specialty Exam:  Presentation  General Appearance:  Casual  Eye Contact: Fair  Speech: Clear and Coherent  Speech Volume: Decreased  Handedness: Right   Mood and Affect  Mood: Anxious; Irritable; Depressed  Affect: Appropriate; Depressed   Thought Process  Thought Processes: Coherent  Descriptions of Associations:Intact  Orientation:Full (Time, Place and Person)  Thought Content:Logical  History of Schizophrenia/Schizoaffective disorder:No data recorded Duration of Psychotic Symptoms:No data recorded Hallucinations:Hallucinations: None  Ideas of Reference:None  Suicidal Thoughts:Suicidal Thoughts: No  Homicidal Thoughts:Homicidal Thoughts: No   Sensorium  Memory: Immediate  Fair  Judgment: Fair  Insight: Fair   Community education officer  Concentration: Fair  Attention Span: Fair  Recall: [place] of Knowledge: Fair  Language: Fair   Psychomotor Activity  Psychomotor Activity:Psychomotor Activity: Normal   Assets  Assets: Communication Skills; Desire for Improvement; Housing; Social Support   Sleep  Sleep:Sleep: Poor   Physical Exam: Physical Exam Vitals and nursing note reviewed.  HENT:     Head: Normocephalic and atraumatic.     Nose: Nose normal.     Mouth/Throat:     Mouth: Mucous membranes are dry.  Eyes:     Extraocular Movements: Extraocular movements intact.     Pupils: Pupils are equal, round, and reactive to light.  Pulmonary:     Effort: Pulmonary effort is normal.  Musculoskeletal:        General: Normal range of motion.     Cervical back: Normal range of motion.  Skin:    General: Skin is dry.  Neurological:     Mental Status: She is alert and oriented to person, place, and time.  Psychiatric:        Attention and Perception: Attention and perception normal.        Mood and Affect: Mood is anxious and depressed.        Speech: Speech normal.        Behavior: Behavior is cooperative.        Thought Content: Thought content does not include suicidal plan.        Cognition and Memory: Cognition and memory normal.        Judgment: Judgment is impulsive.    ROS unknown if currently breastfeeding. There is no height or weight on file to calculate BMI.  Disposition: Patient does not meet criteria for psychiatric inpatient admission. Supportive therapy provided about ongoing stressors. Refer to IOP. Discussed crisis plan, support from social network, calling 911, coming to the Emergency Department, and calling Suicide Hotline. Reassess in the AM  Deloria Lair, NP 12/28/2021 3:18 AM

## 2021-12-28 NOTE — ED Notes (Signed)
VOL/pending reassessment in the AM 

## 2021-12-28 NOTE — ED Notes (Signed)
Pt denies SI/HI and understands that the resources listed in her discharge papers are available to help her if needed.

## 2021-12-28 NOTE — ED Notes (Signed)
Pt tearful upon assessment stating that she has been having auditory hallucinations stating negative things to her about herself (non command.) States that she had SI earlier today without a plan. Denies current SI/HI. States that she has been getting scared of her fiance b/c he gets angry and does not understand what she's going through and this is causing panic attacks.  Pt currently taking suboxone.  Recently stopped taking Hep-C med 'mavyret' d/t interaction with oxcarbazapine which she has recently stopped taking d/t insurance issue.  Also recently stopped taking prozac b/c she 'felt better.'

## 2021-12-29 LAB — POC URINE PREG, ED: Preg Test, Ur: NEGATIVE

## 2021-12-31 ENCOUNTER — Ambulatory Visit: Admit: 2021-12-31 | Payer: MEDICAID | Attending: Internal Medicine | Primary: Internal Medicine

## 2022-01-02 MED ORDER — OXCARBAZEPINE 300 MG TABLET
ORAL_TABLET | Freq: Every morning | ORAL | 1 refills | 90 days | Status: CP
Start: 2022-01-02 — End: ?

## 2022-01-03 MED ORDER — OXCARBAZEPINE 300 MG TABLET
ORAL_TABLET | ORAL | 11 refills | 30 days | Status: CP
Start: 2022-01-03 — End: ?

## 2022-01-07 ENCOUNTER — Telehealth
Admit: 2022-01-07 | Discharge: 2022-01-08 | Payer: MEDICAID | Attending: Student in an Organized Health Care Education/Training Program | Primary: Student in an Organized Health Care Education/Training Program

## 2022-01-07 DIAGNOSIS — F1191 Opioid use disorder in remission: Principal | ICD-10-CM

## 2022-01-07 MED ORDER — BUPRENORPHINE 8 MG-NALOXONE 2 MG SUBLINGUAL TABLET
ORAL_TABLET | Freq: Two times a day (BID) | SUBLINGUAL | 0 refills | 30 days | Status: CP
Start: 2022-01-07 — End: 2022-02-06

## 2022-02-05 ENCOUNTER — Ambulatory Visit: Admit: 2022-02-05 | Discharge: 2022-02-06 | Payer: MEDICAID

## 2022-02-05 DIAGNOSIS — F1191 Opioid use disorder in remission: Principal | ICD-10-CM

## 2022-02-05 DIAGNOSIS — Z124 Encounter for screening for malignant neoplasm of cervix: Principal | ICD-10-CM

## 2022-02-05 DIAGNOSIS — B182 Chronic viral hepatitis C: Principal | ICD-10-CM

## 2022-02-05 DIAGNOSIS — D649 Anemia, unspecified: Principal | ICD-10-CM

## 2022-02-05 DIAGNOSIS — R42 Dizziness and giddiness: Principal | ICD-10-CM

## 2022-02-05 DIAGNOSIS — N949 Unspecified condition associated with female genital organs and menstrual cycle: Principal | ICD-10-CM

## 2022-02-05 MED ORDER — CLOTRIMAZOLE 10 MG TROCHE
ORAL_TABLET | Freq: Three times a day (TID) | ORAL | 0 refills | 100 days | Status: CP | PRN
Start: 2022-02-05 — End: 2022-04-06

## 2022-02-05 MED ORDER — BUPRENORPHINE 8 MG-NALOXONE 2 MG SUBLINGUAL TABLET
ORAL_TABLET | Freq: Two times a day (BID) | SUBLINGUAL | 0 refills | 30.00000 days | Status: CP
Start: 2022-02-05 — End: 2022-03-07

## 2022-02-06 DIAGNOSIS — B9689 Other specified bacterial agents as the cause of diseases classified elsewhere: Principal | ICD-10-CM

## 2022-02-06 DIAGNOSIS — D649 Anemia, unspecified: Principal | ICD-10-CM

## 2022-02-06 DIAGNOSIS — N760 Acute vaginitis: Principal | ICD-10-CM

## 2022-02-06 MED ORDER — METRONIDAZOLE 500 MG TABLET
ORAL_TABLET | Freq: Two times a day (BID) | ORAL | 0 refills | 7 days | Status: CP
Start: 2022-02-06 — End: 2022-02-13

## 2022-02-06 MED ORDER — FERROUS SULFATE 325 MG (65 MG IRON) TABLET
ORAL_TABLET | ORAL | 3 refills | 90 days | Status: CN
Start: 2022-02-06 — End: 2023-02-06

## 2022-02-22 ENCOUNTER — Emergency Department
Admission: EM | Admit: 2022-02-22 | Discharge: 2022-02-23 | Disposition: A | Discharge status: Home / Self Care | Payer: Medicaid - Out of State | Attending: Student in an Organized Health Care Education/Training Program | Admitting: Student in an Organized Health Care Education/Training Program

## 2022-02-22 ENCOUNTER — Emergency Department (HOSPITAL_COMMUNITY): Payer: Medicaid - Out of State

## 2022-02-22 ENCOUNTER — Other Ambulatory Visit: Payer: Self-pay

## 2022-02-22 ENCOUNTER — Encounter (HOSPITAL_COMMUNITY): Payer: Self-pay

## 2022-02-22 DIAGNOSIS — Z1152 Encounter for screening for COVID-19: Secondary | ICD-10-CM | POA: Insufficient documentation

## 2022-02-22 DIAGNOSIS — R441 Visual hallucinations: Secondary | ICD-10-CM

## 2022-02-22 DIAGNOSIS — F419 Anxiety disorder, unspecified: Secondary | ICD-10-CM

## 2022-02-22 DIAGNOSIS — R440 Auditory hallucinations: Secondary | ICD-10-CM

## 2022-02-22 HISTORY — DX: Bipolar disorder, unspecified (CMS HCC): F31.9

## 2022-02-22 NOTE — ED Provider Notes (Signed)
Dana Mare, MD  [facility] - Emergency Department  ED Provider Note    Name: Dana Meyers  Age and Gender: 36 y.o. female  PCP: Pcp Not In System    Triage Note:   Psychiatric Evaluation      Clinical Impression:     Clinical Impression   None    ***    Medical Decision Making  Amount and/or Complexity of Data Reviewed  Labs: ordered.  ECG/medicine tests: ordered.    Risk  Parenteral controlled substances.              ED Triage Vitals [02/22/22 2235]   BP (Non-Invasive) (!) 137/90   Heart Rate 87   Respiratory Rate 18   Temperature 36.9 C (98.4 F)   SpO2 97 %   Weight 55.8 kg (123 lb)   Height 1.549 m (5\' 1" )       Dana Meyers is a 36 y.o. female  Vital signs from triage noted as above.  EKG: ***    Following the above history, physical exam, and studies, the patient was deemed stable and suitable for discharge. The patient was advised to return to the ED for any new or worsening/concerning symptoms. Discharge, any medications and follow up instructions were discussed with the patient, who verbalized understanding. The patient is comfortable with the plan of care.    The patient will be admitted to *** service for further management.    After discussing patient's HPI, ED course and plan, patient was signed out and care transferred to Dr. ***, at *** on 02/22/2022***. Please see their handoff note for further patient care information.    Disposition: Data Unavailable    Follow up:   No follow-up provider specified.  --------------------------------------------------------------------------------------------------------------------------------  HPI:  Dana Meyers is a 36 y.o. female  who presents to the ED today for intrusive thoughts  Pt reports for the past month having increased anxiety, hearing intrusive voices making fun of people for their appearance but also at times seeing things that others don't that are "dark and sexual".  Her aunt reports seeing her talking to someone in her room,  yelling that "she shouldn't have to watch her [significant other] have sex with people".    Pt reports she's felt really anxious about and has been having difficulty sleeping.  She's tried taking 7-8 Unisom a night for the last few months but says it makes her more anxious.  She's been doing a lot of shopping lately, but no large impulsive purchases.    Reports hx of bipolar.  Takes Trileptal normally.  She lives [place], but came here to visit her aunt to get away from her symptoms at home.  She remembered to take her Suboxone, but forgot her Trileptal at home.  She reports 3 wks ago cutting her wrist superficially d/t the increasing anxiety.  She currently has no SI/HI, but does endorse AVH.  Denies other medical complaints today.     History Limitations: None       Historical Data   Below information obtained from chart review and reviewed with patient:  Medical History  Surgical History  Family History  Social History     No Known Allergies    Physical Exam   Objective:  Nursing notes reviewed  ED Triage Vitals [02/22/22 2235]   BP (Non-Invasive) (!) 137/90   Heart Rate 87   Respiratory Rate 18   Temperature 36.9 C (98.4 F)   SpO2 97 %  Weight 55.8 kg (123 lb)   Height 1.549 m (5\' 1" )     Filed Vitals:    02/22/22 2235   BP: (!) 137/90   Pulse: 87   Resp: 18   Temp: 36.9 C (98.4 F)   SpO2: 97%       Physical Exam  Constitutional:       General: She is not in acute distress.     Appearance: Normal appearance. She is normal weight. She is not ill-appearing.   HENT:      Head: Normocephalic and atraumatic.   Cardiovascular:      Rate and Rhythm: Normal rate and regular rhythm.      Pulses: Normal pulses.      Heart sounds: Normal heart sounds.   Pulmonary:      Effort: Pulmonary effort is normal. No respiratory distress.      Comments: Tachypnea    Skin:     General: Skin is warm and dry.      Capillary Refill: Capillary refill takes less than 2 seconds.   Neurological:      General: No focal deficit present.       Mental Status: She is alert and oriented to person, place, and time.   Psychiatric:         Attention and Perception: Attention normal.         Mood and Affect: Mood is anxious.         Speech: Speech is rapid and pressured.         Behavior: Behavior normal. Behavior is cooperative.         Thought Content: Thought content is not paranoid. Thought content does not include homicidal ideation. Thought content does not include homicidal plan.         Cognition and Memory: Cognition normal.         Judgment: Judgment normal.       Patient Data    Labs:   Labs Ordered/Reviewed - No data to display    Imaging:  No orders to display     Orders:  Orders Placed This Encounter    BASIC METABOLIC PANEL    B-TYPE NATRIURETIC PEPTIDE    HCG QUALITATIVE PREGNANCY, SERUM    THYROID STIMULATING HORMONE WITH FREE T4 REFLEX    COVID-19 SCREENING- Asymptomatic    ECG 12-LEAD           //N. 02/24/22, MD// 02/22/2022, 22:59     Department of Emergency Medicine    [facility]       *Parts of this patients chart were completed in a retrospective fashion due to simultaneous direct patient care activities in the Emergency Department.   *This note was partially generated using MModal Fluency Direct system, and there may be some incorrect words, spellings, and punctuation that were not noted in checking the note before saving.

## 2022-02-22 NOTE — ED Triage Notes (Addendum)
Patient reports she came to visit family and she did not bring any of her medications. States she is hearing bad thoughts. Normally takes oxcarbazepine 900mg  daily. Last dose 3 days ago.

## 2022-02-22 NOTE — ED Nurses Note (Signed)
Did not bring psych medications, however did remember Subutex 8mg  BID and has been taking that.

## 2022-02-22 NOTE — ED Nurses Note (Signed)
Presented to ed amb with family with c/o increase anxiety and having terrible thoughts.  In from out of town and forgot one of her medications  oxcarbazepine 900mg  daily  Hasn't had it for 4 days  and the thoughts are getting worse.  No intentions of hurting self or anyone else just the bad thoughts and the anxiety is getting worse.  REquesting something for this  THe oxcarbazipine helps with the bad thoughts but doesn't take them away.  She stated the anxiety is worse and would like something to help with that also.  Takes subutex but remembered to bring them

## 2022-02-23 DIAGNOSIS — R440 Auditory hallucinations: Secondary | ICD-10-CM

## 2022-02-23 DIAGNOSIS — R441 Visual hallucinations: Secondary | ICD-10-CM

## 2022-02-23 DIAGNOSIS — F419 Anxiety disorder, unspecified: Secondary | ICD-10-CM

## 2022-02-23 DIAGNOSIS — F319 Bipolar disorder, unspecified: Secondary | ICD-10-CM

## 2022-02-23 LAB — DRUG SCREEN, NO CONFIRMATION, URINE
AMPHETAMINES, URINE: NEGATIVE
BARBITURATES URINE: NEGATIVE
BENZODIAZEPINES URINE: NEGATIVE
BUPRENORPHINE URINE: POSITIVE — AB
CANNABINOIDS URINE: NEGATIVE
COCAINE METABOLITES URINE: NEGATIVE
CREATININE RANDOM URINE: 84 mg/dL (ref 50–100)
ECSTASY/MDMA URINE: NEGATIVE
FENTANYL, RANDOM URINE: NEGATIVE
METHADONE URINE: NEGATIVE
OPIATES URINE (LOW CUTOFF): NEGATIVE
OXYCODONE URINE: NEGATIVE

## 2022-02-23 LAB — ECG 12-LEAD
Atrial Rate: 74 {beats}/min
Calculated P Axis: 55 degrees
Calculated R Axis: 23 degrees
Calculated T Axis: 49 degrees
PR Interval: 140 ms
QRS Duration: 86 ms
QT Interval: 402 ms
QTC Calculation: 446 ms
Ventricular rate: 74 {beats}/min

## 2022-02-23 LAB — BASIC METABOLIC PANEL
ANION GAP: 8 mmol/L (ref 4–13)
BUN/CREA RATIO: 12 (ref 6–22)
BUN: 8 mg/dL (ref 8–25)
CALCIUM: 9 mg/dL (ref 8.6–10.2)
CHLORIDE: 110 mmol/L (ref 96–111)
CO2 TOTAL: 23 mmol/L (ref 22–30)
CREATININE: 0.65 mg/dL (ref 0.60–1.05)
ESTIMATED GFR - FEMALE: >90 mL/min/BSA (ref >=60)
GLUCOSE: 105 mg/dL (ref 65–125)
POTASSIUM: 4.4 mmol/L (ref 3.5–5.1)
SODIUM: 141 mmol/L (ref 136–145)

## 2022-02-23 LAB — THYROXINE, FREE (FREE T4): THYROXINE (T4), FREE: 0.65 ng/dL — ABNORMAL LOW (ref 0.70–1.48)

## 2022-02-23 LAB — URINALYSIS, MACROSCOPIC
BILIRUBIN: NEGATIVE mg/dL
BLOOD: NEGATIVE mg/dL
GLUCOSE: NEGATIVE mg/dL
KETONES: NEGATIVE mg/dL
LEUKOCYTES: NEGATIVE WBCs/uL
NITRITE: NEGATIVE
PH: 7.5 (ref 5.0–8.0)
PROTEIN: NEGATIVE mg/dL
SPECIFIC GRAVITY: 1.014 (ref 1.002–1.030)
UROBILINOGEN: <2 mg/dL (ref <2.0)

## 2022-02-23 LAB — URINALYSIS, MICROSCOPIC

## 2022-02-23 LAB — ETHANOL, SERUM/PLASMA
ETHANOL: <10 mg/dL (ref <10)
ETHANOL: NOT DETECTED

## 2022-02-23 LAB — COVID-19 ~~LOC~~ MOLECULAR LAB TESTING
INFLUENZA VIRUS TYPE A: NOT DETECTED
INFLUENZA VIRUS TYPE B: NOT DETECTED
RESPIRATORY SYNCTIAL VIRUS (RSV): NOT DETECTED
SARS-CoV-2: NOT DETECTED

## 2022-02-23 LAB — THYROID STIMULATING HORMONE WITH FREE T4 REFLEX: TSH: 5.717 u[IU]/mL — ABNORMAL HIGH (ref 0.350–4.940)

## 2022-02-23 LAB — B-TYPE NATRIURETIC PEPTIDE (BNP),PLASMA: BNP: 19 pg/mL (ref <=99)

## 2022-02-23 LAB — HCG, URINE QUALITATIVE, PREGNANCY: HCG URINE QUALITATIVE: NEGATIVE

## 2022-02-23 LAB — ACETAMINOPHEN LEVEL: ACETAMINOPHEN LEVEL: <5 ug/mL — ABNORMAL LOW (ref 10–30)

## 2022-02-23 LAB — SALICYLATE ACID LEVEL: SALICYLATE LEVEL: <5 mg/dL — ABNORMAL LOW (ref 15–30)

## 2022-02-23 MED ORDER — OXCARBAZEPINE 150 MG TABLET
900.0000 mg | ORAL_TABLET | ORAL | Status: AC
Start: 2022-02-23 — End: 2022-02-23
  Administered 2022-02-23: 900 mg via ORAL
  Filled 2022-02-23: qty 6

## 2022-02-23 MED ORDER — LORAZEPAM 1 MG TABLET
1.0000 mg | ORAL_TABLET | ORAL | Status: AC
Start: 2022-02-23 — End: 2022-02-23
  Administered 2022-02-23: 1 mg via ORAL
  Filled 2022-02-23: qty 1

## 2022-02-23 MED ORDER — LORAZEPAM 0.5 MG TABLET
0.5000 mg | ORAL_TABLET | Freq: Two times a day (BID) | ORAL | 0 refills | Status: AC | PRN
Start: 2022-02-23 — End: ?

## 2022-02-23 MED ORDER — OXCARBAZEPINE 300 MG TABLET
900.0000 mg | ORAL_TABLET | Freq: Every day | ORAL | 0 refills | Status: AC
Start: 2022-02-23 — End: 2022-03-09

## 2022-02-23 MED ORDER — HYDROXYZINE PAMOATE 25 MG CAPSULE
25.0000 mg | ORAL_CAPSULE | Freq: Three times a day (TID) | ORAL | 0 refills | Status: DC | PRN
Start: 2022-02-23 — End: 2022-02-23

## 2022-02-23 NOTE — ED Nurses Note (Signed)
[facility] Coordinator responded:  "Thank you for calling [facility].  Our physicians are currently with other patients.  We appreciate your patience and look forward to assisting you with the same high-level care.  Please stay on the line for the next available psychiatrist."

## 2022-02-23 NOTE — ED Nurses Note (Signed)
Patient discharged home with family.  AVS reviewed with patient/care giver.  A written copy of the AVS and discharge instructions was given to the patient/care giver.  Questions sufficiently answered as needed.  Patient/care giver encouraged to follow up with PCP as indicated.  In the event of an emergency, patient/care giver instructed to call 911 or go to the nearest emergency room.

## 2022-02-23 NOTE — ED Nurses Note (Signed)
Logged into [facility]

## 2022-02-23 NOTE — ED Nurses Note (Signed)
Dr Virgia Land @ [facility] is speaking with Patient via [place]

## 2022-02-23 NOTE — ED Nurses Note (Signed)
Dr Virgia Land @ [facility] logged on and spoke with Dr Rutherford Nail

## 2022-02-23 NOTE — ED Nurses Note (Signed)
[facility] Coordinator Update:  "The doctor will be with you shortly."
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
## 2022-02-23 NOTE — Discharge Instructions (Signed)
1) Fill your prescriptions for Trileptal and Hydroxyzine tomorrow.    2) Do not take hydroxyzine more than prescribed or it can make you feel anxious and make sleeping difficult.    3) Return for any thoughts of self-harm or other worrisome symptoms immediately.    4) Follow up with your behavioral health therapist when you get home.

## 2022-03-02 ENCOUNTER — Telehealth: Admit: 2022-03-02 | Discharge: 2022-03-03 | Payer: MEDICAID | Attending: Internal Medicine | Primary: Internal Medicine

## 2022-03-02 DIAGNOSIS — F22 Delusional disorders: Principal | ICD-10-CM

## 2022-03-02 DIAGNOSIS — F419 Anxiety disorder, unspecified: Principal | ICD-10-CM

## 2022-03-02 MED ORDER — OXCARBAZEPINE 300 MG TABLET
ORAL_TABLET | Freq: Two times a day (BID) | ORAL | 11 refills | 30 days | Status: CP
Start: 2022-03-02 — End: 2023-03-02

## 2022-03-02 MED ORDER — OLANZAPINE 2.5 MG TABLET
ORAL_TABLET | Freq: Every evening | ORAL | 2 refills | 30 days | Status: CP
Start: 2022-03-02 — End: 2023-03-02

## 2022-03-05 ENCOUNTER — Telehealth
Admit: 2022-03-05 | Discharge: 2022-03-06 | Payer: MEDICAID | Attending: Student in an Organized Health Care Education/Training Program | Primary: Student in an Organized Health Care Education/Training Program

## 2022-03-05 DIAGNOSIS — F419 Anxiety disorder, unspecified: Principal | ICD-10-CM

## 2022-03-05 MED ORDER — OLANZAPINE 2.5 MG TABLET
ORAL_TABLET | Freq: Every evening | ORAL | 1 refills | 0 days
Start: 2022-03-05 — End: ?

## 2022-03-05 MED ORDER — LORAZEPAM 0.5 MG TABLET
ORAL_TABLET | Freq: Two times a day (BID) | ORAL | 0 refills | 14 days | Status: CP | PRN
Start: 2022-03-05 — End: 2022-03-19

## 2022-03-06 MED ORDER — BUPRENORPHINE 8 MG-NALOXONE 2 MG SUBLINGUAL TABLET
ORAL_TABLET | Freq: Two times a day (BID) | SUBLINGUAL | 0 refills | 30 days | Status: CP
Start: 2022-03-06 — End: 2022-04-05

## 2022-03-06 MED ORDER — OLANZAPINE 2.5 MG TABLET
ORAL_TABLET | Freq: Every evening | ORAL | 1 refills | 90 days
Start: 2022-03-06 — End: ?

## 2022-03-23 ENCOUNTER — Emergency Department
Admission: EM | Admit: 2022-03-23 | Discharge: 2022-03-23 | Disposition: A | Discharge status: Home / Self Care | Payer: Medicaid Other | Attending: Emergency Medicine | Admitting: Emergency Medicine

## 2022-03-23 ENCOUNTER — Other Ambulatory Visit: Payer: Self-pay

## 2022-03-23 ENCOUNTER — Encounter: Payer: Self-pay | Admitting: Emergency Medicine

## 2022-03-23 DIAGNOSIS — R112 Nausea with vomiting, unspecified: Secondary | ICD-10-CM | POA: Diagnosis not present

## 2022-03-23 LAB — CBC
HCT: 33.8 % — ABNORMAL LOW (ref 36.0–46.0)
Hemoglobin: 11.2 g/dL — ABNORMAL LOW (ref 12.0–15.0)
MCH: 27.6 pg (ref 26.0–34.0)
MCHC: 33.1 g/dL (ref 30.0–36.0)
MCV: 83.3 fL (ref 80.0–100.0)
Platelets: 272 10*3/uL (ref 150–400)
RBC: 4.06 MIL/uL (ref 3.87–5.11)
RDW: 13.6 % (ref 11.5–15.5)
WBC: 8.6 10*3/uL (ref 4.0–10.5)
nRBC: 0 % (ref 0.0–0.2)

## 2022-03-23 LAB — COMPREHENSIVE METABOLIC PANEL
ALT: 15 U/L (ref 0–44)
AST: 23 U/L (ref 15–41)
Albumin: 4.4 g/dL (ref 3.5–5.0)
Alkaline Phosphatase: 62 U/L (ref 38–126)
Anion gap: 9 (ref 5–15)
BUN: 10 mg/dL (ref 6–20)
CO2: 23 mmol/L (ref 22–32)
Calcium: 8.7 mg/dL — ABNORMAL LOW (ref 8.9–10.3)
Chloride: 98 mmol/L (ref 98–111)
Creatinine, Ser: 0.53 mg/dL (ref 0.44–1.00)
GFR, Estimated: >60 mL/min (ref >60)
Glucose, Bld: 112 mg/dL — ABNORMAL HIGH (ref 70–99)
Potassium: 4.2 mmol/L (ref 3.5–5.1)
Sodium: 130 mmol/L — ABNORMAL LOW (ref 135–145)
Total Bilirubin: 0.4 mg/dL (ref 0.3–1.2)
Total Protein: 7.2 g/dL (ref 6.5–8.1)

## 2022-03-23 LAB — URINALYSIS, ROUTINE W REFLEX MICROSCOPIC
Bilirubin Urine: NEGATIVE
Glucose, UA: NEGATIVE mg/dL
Hgb urine dipstick: NEGATIVE
Ketones, ur: 5 mg/dL — AB
Leukocytes,Ua: NEGATIVE
Nitrite: NEGATIVE
Protein, ur: 30 mg/dL — AB
Specific Gravity, Urine: 1.026 (ref 1.005–1.030)
pH: 6 (ref 5.0–8.0)

## 2022-03-23 LAB — POC URINE PREG, ED: Preg Test, Ur: NEGATIVE

## 2022-03-23 LAB — URINE DRUG SCREEN, QUALITATIVE (ARMC ONLY)
Amphetamines, Ur Screen: NOT DETECTED
Barbiturates, Ur Screen: NOT DETECTED
Benzodiazepine, Ur Scrn: NOT DETECTED
Cannabinoid 50 Ng, Ur ~~LOC~~: POSITIVE — AB
Cocaine Metabolite,Ur ~~LOC~~: NOT DETECTED
MDMA (Ecstasy)Ur Screen: NOT DETECTED
Methadone Scn, Ur: NOT DETECTED
Opiate, Ur Screen: NOT DETECTED
Phencyclidine (PCP) Ur S: NOT DETECTED
Tricyclic, Ur Screen: POSITIVE — AB

## 2022-03-23 LAB — LIPASE, BLOOD: Lipase: 33 U/L (ref 11–51)

## 2022-03-23 MED ORDER — KETOROLAC TROMETHAMINE 15 MG/ML IJ SOLN
15.0000 mg | Freq: Once | INTRAMUSCULAR | Status: AC
  Administered 2022-03-23: 15 mg via INTRAVENOUS
  Filled 2022-03-23: qty 1

## 2022-03-23 MED ORDER — ONDANSETRON 4 MG PO TBDP: 4.0000 mg | ORAL_TABLET | Freq: Three times a day (TID) | ORAL | 0 refills | Status: DC | PRN

## 2022-03-23 MED ORDER — SODIUM CHLORIDE 0.9 % IV BOLUS
1000.0000 mL | Freq: Once | INTRAVENOUS | Status: AC
  Administered 2022-03-23: 1000 mL via INTRAVENOUS

## 2022-03-23 MED ORDER — METOCLOPRAMIDE HCL 5 MG/ML IJ SOLN
10.0000 mg | Freq: Once | INTRAMUSCULAR | Status: AC
  Administered 2022-03-23: 10 mg via INTRAVENOUS
  Filled 2022-03-23: qty 2

## 2022-03-23 NOTE — Discharge Instructions (Signed)
You are seen in the emergency department for nausea and vomiting and not feeling well.  You appeared mildly dehydrated on your lab work.  Your pregnancy test was negative.  You were given a prescription for nausea medication.  Stay hydrated and drink plenty of fluids.  Return to the emergency department if your symptoms worsen.

## 2022-03-23 NOTE — ED Triage Notes (Signed)
Presents with some n/v  from home

## 2022-03-23 NOTE — ED Provider Notes (Signed)
[facility] Provider Note    Event Date/Time   First MD Initiated Contact with Patient 03/23/22 1148     (approximate)   History   Emesis   HPI  Annette Roberts is a 37 y.o. female with past medical history significant for polysubstance abuse, who presents to the emergency department with nausea and vomiting and dizziness.  Endorses nausea and dizziness that she describes as lightheadedness with standing.  States that this is been going on for 1 day.  States that has happened in the past but she is uncertain of what it was at that time.  States that she is been tolerating p.o. without any difficulties.  Denies any dysuria, urinary urgency or frequency.  No abdominal pain at this time.  No falls or trauma.  Denies any headache.  Does endorse a history of migraine headaches and states that this may be feels similar.     Physical Exam   Triage Vital Signs: ED Triage Vitals [03/23/22 1133]  Enc Vitals Group     BP      Pulse      Resp      Temp      Temp src      SpO2      Weight 119 lb 0.8 oz (54 kg)     Height 5\' 1"  (1.549 m)     Head Circumference      Peak Flow      Pain Score 3     Pain Loc      Pain Edu?      Excl. in [place]?     Most recent vital signs: Vitals:   03/23/22 1310 03/23/22 1432  BP: (!) 127/91 126/88  Pulse: 62 62  Resp: 17 17  Temp: 97.7 F (36.5 C) 97.7 F (36.5 C)  SpO2: 100% 100%    Physical Exam Constitutional:      Appearance: She is well-developed.  HENT:     Head: Atraumatic.  Eyes:     Conjunctiva/sclera: Conjunctivae normal.  Cardiovascular:     Rate and Rhythm: Regular rhythm.  Pulmonary:     Effort: No respiratory distress.  Abdominal:     General: There is no distension.  Musculoskeletal:        General: Normal range of motion.     Cervical back: Normal range of motion.  Skin:    General: Skin is warm.  Neurological:     Mental Status: She is alert. Mental status is at baseline.     Comments:  Ambulating in the emergency department without any difficulty or dizziness.  Cranial nerves intact.  5/5 strength bilateral upper and lower extremities.      IMPRESSION / MDM / ASSESSMENT AND PLAN / ED COURSE  I reviewed the triage vital signs and the nursing notes.  Differential diagnosis including viral illness including gastroenteritis, migraine headache, electrolyte abnormality, dehydration, hyperemesis syndrome, pregnancy   Labs (all labs ordered are listed, but only abnormal results are displayed) Labs interpreted as -  Mild hyponatremia likely secondary to dehydration.  No significant electrolyte abnormalities.  Pregnancy test is negative.  UA without signs of urinary tract infection but did have small amount of ketones.  UDS positive for cannabis.  Labs Reviewed  CBC - Abnormal; Notable for the following components:      Result Value   Hemoglobin 11.2 (*)    HCT 33.8 (*)    All other components within normal limits  COMPREHENSIVE METABOLIC PANEL -  Abnormal; Notable for the following components:   Sodium 130 (*)    Glucose, Bld 112 (*)    Calcium 8.7 (*)    All other components within normal limits  URINALYSIS, ROUTINE W REFLEX MICROSCOPIC - Abnormal; Notable for the following components:   Color, Urine YELLOW (*)    APPearance HAZY (*)    Ketones, ur 5 (*)    Protein, ur 30 (*)    Bacteria, UA RARE (*)    All other components within normal limits  URINE DRUG SCREEN, QUALITATIVE ([facility] ONLY) - Abnormal; Notable for the following components:   Tricyclic, Ur Screen POSITIVE (*)    Cannabinoid 50 Ng, Ur [place] POSITIVE (*)    All other components within normal limits  LIPASE, BLOOD  POC URINE PREG, ED      Given IV fluids and antiemetics  Notified by the nurse that the patient wants to leave.  States that she is feeling somewhat better.  No further episodes of vomiting in the emergency department.  Ambulating without any difficulties.  Will give a prescription for  antiemetics.  Discussed follow-up with primary care physician and encouraged on smoking cessation.  Abdomen is nontender have low suspicion for acute appendicitis or need of CT scan.  Clinical picture is not consistent with an ovarian torsion.   PROCEDURES:  Critical Care performed: No  Procedures  Patient's presentation is most consistent with acute presentation with potential threat to life or bodily function.   MEDICATIONS ORDERED IN ED: Medications  sodium chloride 0.9 % bolus 1,000 mL (0 mLs Intravenous Stopped 03/23/22 1427)  metoCLOPramide (REGLAN) injection 10 mg (10 mg Intravenous Given 03/23/22 1305)  ketorolac (TORADOL) 15 MG/ML injection 15 mg (15 mg Intravenous Given 03/23/22 1306)    FINAL CLINICAL IMPRESSION(S) / ED DIAGNOSES   Final diagnoses:  Nausea and vomiting, unspecified vomiting type     Rx / DC Orders   ED Discharge Orders          Ordered    ondansetron (ZOFRAN-ODT) 4 MG disintegrating tablet  Every 8 hours PRN        03/23/22 1428             Note:  This document was prepared using Dragon voice recognition software and may include unintentional dictation errors.   Nathaniel Man, MD 03/23/22 1556

## 2022-03-26 ENCOUNTER — Ambulatory Visit: Admit: 2022-03-26 | Payer: MEDICAID

## 2022-04-25 ENCOUNTER — Emergency Department
Admission: EM | Admit: 2022-04-25 | Discharge: 2022-04-25 | Disposition: A | Discharge status: Home / Self Care | Payer: No Typology Code available for payment source | Attending: Emergency Medicine | Admitting: Emergency Medicine

## 2022-04-25 ENCOUNTER — Emergency Department: Payer: No Typology Code available for payment source

## 2022-04-25 ENCOUNTER — Other Ambulatory Visit: Payer: Self-pay

## 2022-04-25 DIAGNOSIS — R0789 Other chest pain: Secondary | ICD-10-CM

## 2022-04-25 DIAGNOSIS — R002 Palpitations: Secondary | ICD-10-CM

## 2022-04-25 DIAGNOSIS — F419 Anxiety disorder, unspecified: Secondary | ICD-10-CM | POA: Diagnosis not present

## 2022-04-25 DIAGNOSIS — R079 Chest pain, unspecified: Secondary | ICD-10-CM | POA: Diagnosis present

## 2022-04-25 DIAGNOSIS — R42 Dizziness and giddiness: Secondary | ICD-10-CM

## 2022-04-25 LAB — CBC
HCT: 36.7 % (ref 36.0–46.0)
Hemoglobin: 11.7 g/dL — ABNORMAL LOW (ref 12.0–15.0)
MCH: 27.6 pg (ref 26.0–34.0)
MCHC: 31.9 g/dL (ref 30.0–36.0)
MCV: 86.6 fL (ref 80.0–100.0)
Platelets: 220 10*3/uL (ref 150–400)
RBC: 4.24 MIL/uL (ref 3.87–5.11)
RDW: 14.1 % (ref 11.5–15.5)
WBC: 7.6 10*3/uL (ref 4.0–10.5)
nRBC: 0 % (ref 0.0–0.2)

## 2022-04-25 LAB — BASIC METABOLIC PANEL
Anion gap: 3 — ABNORMAL LOW (ref 5–15)
BUN: 5 mg/dL — ABNORMAL LOW (ref 6–20)
CO2: 29 mmol/L (ref 22–32)
Calcium: 9 mg/dL (ref 8.9–10.3)
Chloride: 104 mmol/L (ref 98–111)
Creatinine, Ser: 0.75 mg/dL (ref 0.44–1.00)
GFR, Estimated: >60 mL/min (ref >60)
Glucose, Bld: 119 mg/dL — ABNORMAL HIGH (ref 70–99)
Potassium: 4.2 mmol/L (ref 3.5–5.1)
Sodium: 136 mmol/L (ref 135–145)

## 2022-04-25 LAB — TROPONIN I (HIGH SENSITIVITY): Troponin I (High Sensitivity): <2 ng/L (ref <18)

## 2022-04-25 NOTE — Discharge Instructions (Addendum)
Please begin taking your blood pressure at least 1 hour after taking her blood pressure medications.  If your systolic blood pressure (top number) is lower than [id number] or your diastolic

## 2022-04-25 NOTE — ED Notes (Signed)
"  Currently being processed" in lab. Will use chart labels.

## 2022-04-25 NOTE — ED Triage Notes (Signed)
Pt to ED POV with partner for chest pain and dizziness since 1 hour ago. States heart is beating fast and has sharp and dull 8/10 central CP that radiates to back.   Pt is former smoker, 20 years 1/2 pack/day and is current vaper since 2 years.  Pt takes ativan, caplyta, suboxone, lithium. Recent dosage increase 2 months ago.  Also complains of bilateral earache.  Skin dry, respirations unlabored.

## 2022-04-25 NOTE — ED Provider Notes (Signed)
[facility] Provider Note   Event Date/Time   First MD Initiated Contact with Patient 04/25/22 1344     (approximate) History  Chest Pain and Dizziness  HPI Annette Roberts is a 37 y.o. female with stated past medical history of anxiety and panic attacks who presents complaining of of intermittent chest pain, lightheadedness, perioral numbness, bilateral finger numbness, and palpitations that has been present over the last week and have been worsened from previous "panic attacks" that she has had in the past.  Patient states that she has had increasing anxiety over this time as well but has felt uncomfortable talking to others about it.  Patient is concerned as she states this is similar to previous panic attacks that she has had however this is much worse. ROS: Patient currently denies any vision changes, tinnitus, difficulty speaking, facial droop, sore throat, shortness of breath, abdominal pain, nausea/vomiting/diarrhea, dysuria, or weakness/numbness/paresthesias in any extremity   Physical Exam  Triage Vital Signs: ED Triage Vitals  Enc Vitals Group     BP 04/25/22 1330 107/72     Pulse Rate 04/25/22 1330 76     Resp 04/25/22 1330 16     Temp 04/25/22 1330 98.4 F (36.9 C)     Temp Source 04/25/22 1330 Oral     SpO2 04/25/22 1330 96 %     Weight 04/25/22 1326 110 lb (49.9 kg)     Height 04/25/22 1326 '5\' 1"'$  (1.549 m)     Head Circumference --      Peak Flow --      Pain Score 04/25/22 1325 8     Pain Loc --      Pain Edu? --      Excl. in [place]? --    Most recent vital signs: Vitals:   04/25/22 1330  BP: 107/72  Pulse: 76  Resp: 16  Temp: 98.4 F (36.9 C)  SpO2: 96%   General: Awake, oriented x4. CV:  Good peripheral perfusion.  Resp:  Normal effort.  Abd:  No distention.  Other:  Middle-aged Caucasian female laying in bed in no acute distress ED Results / Procedures / Treatments  Labs (all labs ordered are listed, but only abnormal results  are displayed) Labs Reviewed  BASIC METABOLIC PANEL - Abnormal; Notable for the following components:      Result Value   Glucose, Bld 119 (*)    BUN 5 (*)    Anion gap 3 (*)    All other components within normal limits  CBC - Abnormal; Notable for the following components:   Hemoglobin 11.7 (*)    All other components within normal limits  POC URINE PREG, ED  TROPONIN I (HIGH SENSITIVITY)   EKG ED ECG REPORT I, Naaman Plummer, the attending physician, personally viewed and interpreted this ECG. Date: 04/25/2022 EKG Time: 1326 Rate: 77 Rhythm: normal sinus rhythm QRS Axis: normal Intervals: normal ST/T Wave abnormalities: normal Narrative Interpretation: no evidence of acute ischemia RADIOLOGY ED MD interpretation: 2 view chest x-ray interpreted by me shows no evidence of acute abnormalities including no pneumonia, pneumothorax, or widened mediastinum -Agree with radiology assessment Official radiology report(s): DG Chest 2 View  Result Date: 04/25/2022 CLINICAL DATA:  Chest pain EXAM: CHEST - 2 VIEW COMPARISON:  Chest x-ray June 20, 2021 FINDINGS: The heart size and mediastinal contours are within normal limits. Both lungs are clear. The visualized skeletal structures are unremarkable. IMPRESSION: No active cardiopulmonary disease. Electronically Signed  By: Dorise Bullion III M.D.   On: 04/25/2022 13:55   PROCEDURES: Critical Care performed: No Procedures MEDICATIONS ORDERED IN ED: Medications - No data to display IMPRESSION / MDM / [place] / ED COURSE  I reviewed the triage vital signs and the nursing notes.                             The patient is on the cardiac monitor to evaluate for evidence of arrhythmia and/or significant heart rate changes. Patient's presentation is most consistent with acute presentation with potential threat to life or bodily function. This patient presents with symptoms consistent with acute anxiety reaction / panic attack. Low  suspicion for acute cardiopulmonary process including ACS, PE, or thoracic aortic dissection. Denies any ingestions or any other medical complaints. No evidence of alcohol withdrawal symptoms. Presentation not consistent with overt toxidrome, ingestion given history & physical. Presentation not consistent with organic or medical emergency at this time. No acute indication for psychiatric consultation (without SI/HI, AH/VH). Cautious return precautions discussed with full understanding.  Plan: Psych follow up PRN Dispo: Discharge   FINAL CLINICAL IMPRESSION(S) / ED DIAGNOSES   Final diagnoses:  Atypical chest pain  Lightheadedness  Palpitations  Anxiety   Rx / DC Orders   ED Discharge Orders          Ordered    Ambulatory referral to Cardiology       Comments: If you have not heard from the Cardiology office within the next 72 hours please call [phone].   04/25/22 1510           Note:  This document was prepared using Dragon voice recognition software and may include unintentional dictation errors.   Naaman Plummer, MD 04/25/22 [phone]

## 2022-05-05 ENCOUNTER — Ambulatory Visit
Admit: 2022-05-05 | Payer: MEDICAID | Attending: Student in an Organized Health Care Education/Training Program | Primary: Student in an Organized Health Care Education/Training Program

## 2022-06-10 ENCOUNTER — Emergency Department: Payer: Medicaid Other

## 2022-06-10 ENCOUNTER — Encounter: Payer: Self-pay | Admitting: Emergency Medicine

## 2022-06-10 ENCOUNTER — Emergency Department
Admission: EM | Admit: 2022-06-10 | Discharge: 2022-06-10 | Discharge status: Against Medical Advice | Payer: Medicaid Other | Attending: Emergency Medicine | Admitting: Emergency Medicine

## 2022-06-10 DIAGNOSIS — Z5321 Procedure and treatment not carried out due to patient leaving prior to being seen by health care provider: Secondary | ICD-10-CM | POA: Insufficient documentation

## 2022-06-10 DIAGNOSIS — R079 Chest pain, unspecified: Secondary | ICD-10-CM | POA: Insufficient documentation

## 2022-06-10 DIAGNOSIS — R42 Dizziness and giddiness: Secondary | ICD-10-CM | POA: Diagnosis not present

## 2022-06-10 LAB — BASIC METABOLIC PANEL
Anion gap: 12 (ref 5–15)
BUN: 11 mg/dL (ref 6–20)
CO2: 21 mmol/L — ABNORMAL LOW (ref 22–32)
Calcium: 8.9 mg/dL (ref 8.9–10.3)
Chloride: 103 mmol/L (ref 98–111)
Creatinine, Ser: 0.89 mg/dL (ref 0.44–1.00)
GFR, Estimated: >60 mL/min (ref >60)
Glucose, Bld: 147 mg/dL — ABNORMAL HIGH (ref 70–99)
Potassium: 3.1 mmol/L — ABNORMAL LOW (ref 3.5–5.1)
Sodium: 136 mmol/L (ref 135–145)

## 2022-06-10 LAB — CBC
HCT: 35.9 % — ABNORMAL LOW (ref 36.0–46.0)
Hemoglobin: 11.6 g/dL — ABNORMAL LOW (ref 12.0–15.0)
MCH: 27.8 pg (ref 26.0–34.0)
MCHC: 32.3 g/dL (ref 30.0–36.0)
MCV: 85.9 fL (ref 80.0–100.0)
Platelets: 177 10*3/uL (ref 150–400)
RBC: 4.18 MIL/uL (ref 3.87–5.11)
RDW: 13.8 % (ref 11.5–15.5)
WBC: 16.7 10*3/uL — ABNORMAL HIGH (ref 4.0–10.5)
nRBC: 0 % (ref 0.0–0.2)

## 2022-06-10 LAB — POC URINE PREG, ED: Preg Test, Ur: NEGATIVE

## 2022-06-10 LAB — TROPONIN I (HIGH SENSITIVITY): Troponin I (High Sensitivity): <2 ng/L (ref <18)

## 2022-06-10 NOTE — ED Triage Notes (Signed)
EMS brings pt in for c/o palpitations and dizziness; began new mood stabilizer recently and taking "6" benadryl nightly for insomnia

## 2022-06-10 NOTE — ED Notes (Addendum)
Received phone call from female stating that she is Psychiatrist mother......"reports pt is schizophrenic and is taking 60 benadryl a day"; st she is enroute to [place] to take out IVC papers on her; pt not found in lobby, bathroom or outside at this time; per triage nurse, pt denied any SI or HI and was acting appropriately, did receive MSE by provider in triage as well; charge nurse Solon Augusta RN notified

## 2022-06-10 NOTE — ED Triage Notes (Signed)
Pt presents via [facility] from home with complaints of CP and dizziness that started this evening. Hx of anxiety and endorses a lot of stress.  Of note, the patient recently started a mood stabilizer and took 6 last night to help with insomnia. A&Ox4 at this time. Denies fevers, cough, chills, or SOB.

## 2022-06-10 NOTE — ED Notes (Signed)
No answer when called several times from lobby 

## 2022-06-10 NOTE — ED Provider Triage Note (Signed)
Emergency Medicine Provider Triage Evaluation Note  Annette Roberts , a 37 y.o. female  was evaluated in triage.  Pt complains of right side chest pain and dizziness. History of anxiety and is under a lot of stress. Reports starting a new mood stabilizer yesterday--unsure of the name .   Physical Exam  Pulse (!) 114   Resp 20   Ht  (1.549 m)   Wt 53.1 kg   SpO2 98%   BMI 22.11 kg/m  Gen:   Awake, no distress   Resp:  Normal effort  MSK:   Moves extremities without difficulty  Other:    Medical Decision Making  Medically screening exam initiated at 7:21 PM.  Appropriate orders placed.  Annette Roberts was informed that the remainder of the evaluation will be completed by another provider, this initial triage assessment does not replace that evaluation, and the importance of remaining in the ED until their evaluation is complete.  Cardiac protocol initiated.   Chinita Pester, FNP 06/10/22 1926

## 2022-08-17 IMAGING — CT CT HEAD W/O CM
4 series · 17 of 47 positions shown, 19 images · non-contrast
Comparison: 07/03/2020

CLINICAL DATA: Headache, chronic, new features or increased
frequency. Symptoms over the last 2 days.



[Series 2: head wo · axial · 0.41mm/px · z∈[-114,+6]mm · 7 of 32 slices shown, 9 images]
[im 4/32  brain]
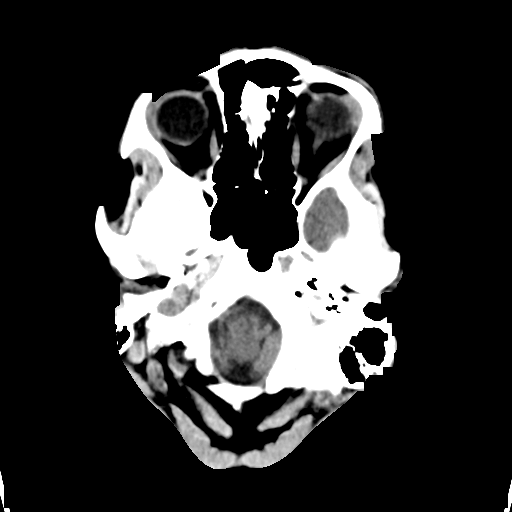
[im 4/32  bone]
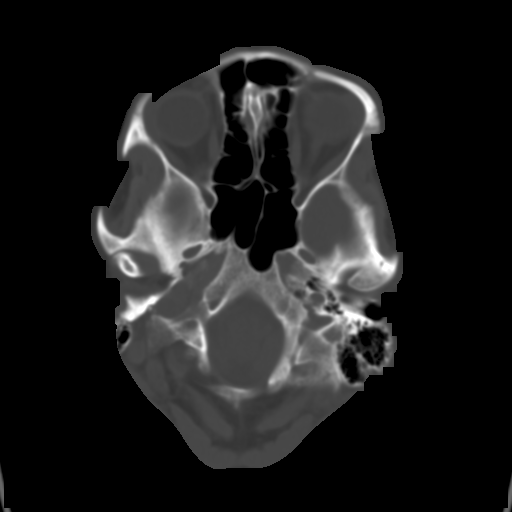
[im 8/32  brain]
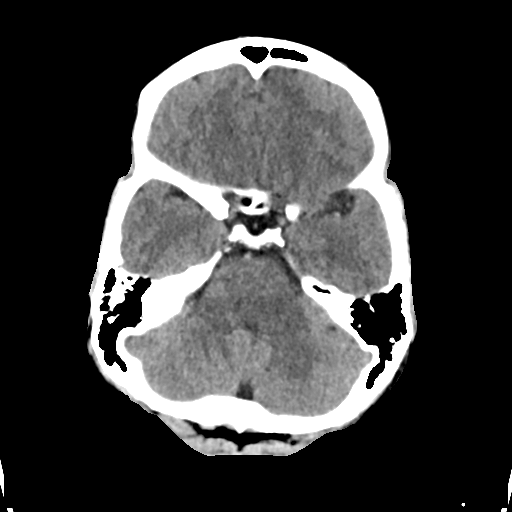
[im 12/32  brain]
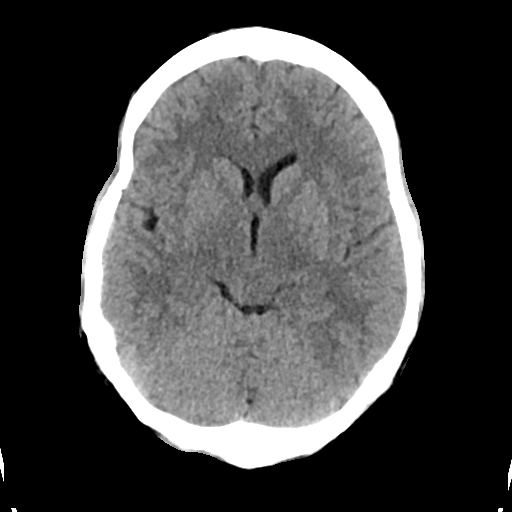
[im 16/32  brain]
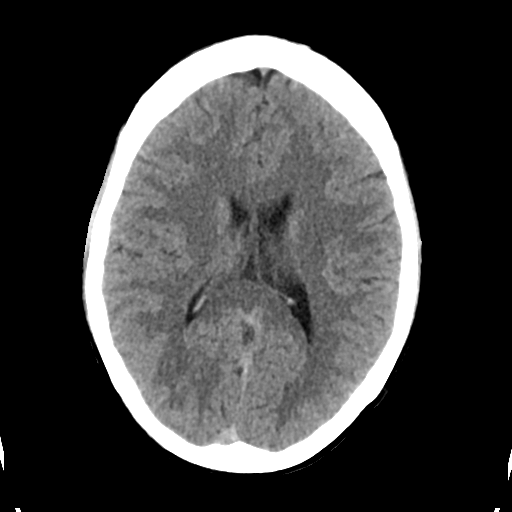
[im 20/32  brain]
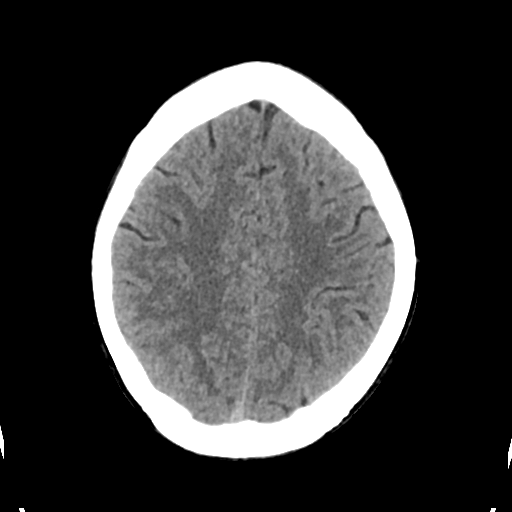
[im 20/32  bone]
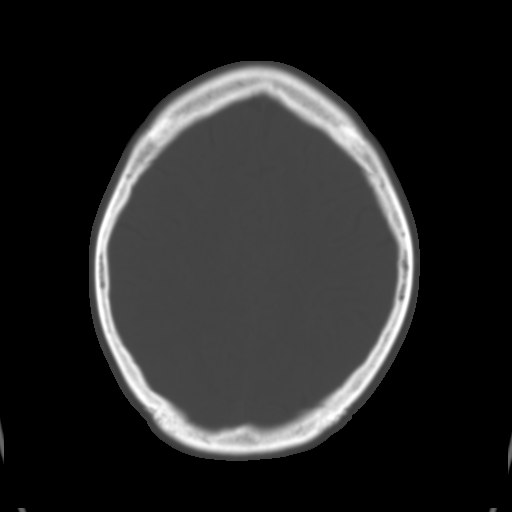
[im 24/32  brain]
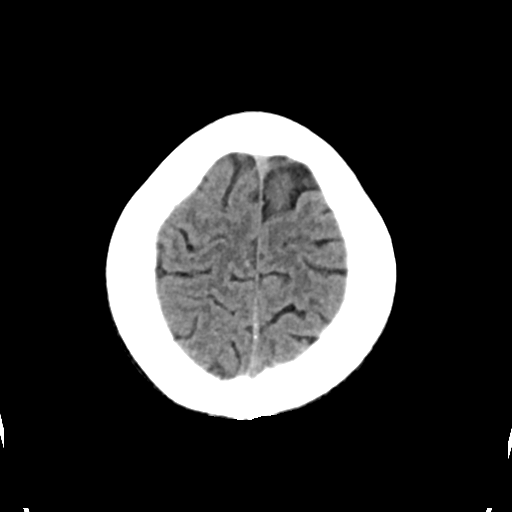
[im 28/32  brain]
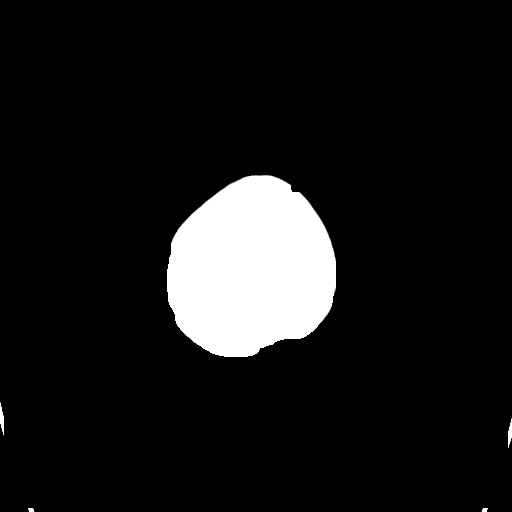

[Series 3: head bone · axial · 0.41mm/px · z∈[-115,-59]mm · 4 of 79 slices shown]
[im 8/79  bone]
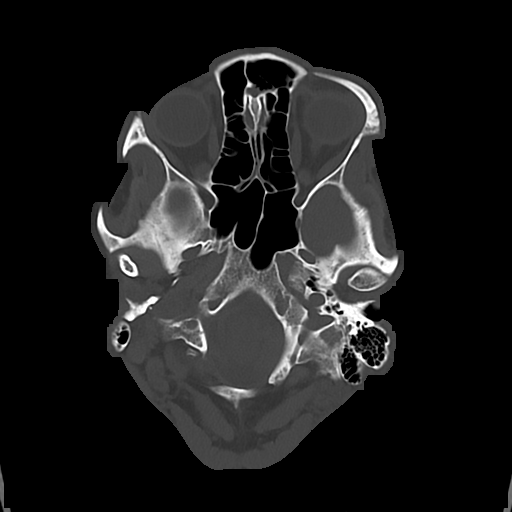
[im 16/79  bone]
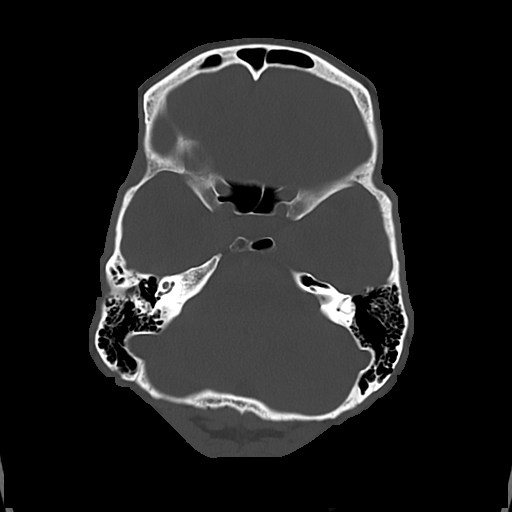
[im 24/79  bone]
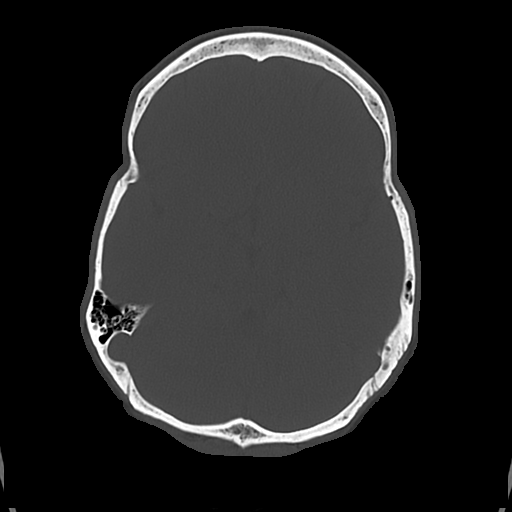
[im 36/79  bone]
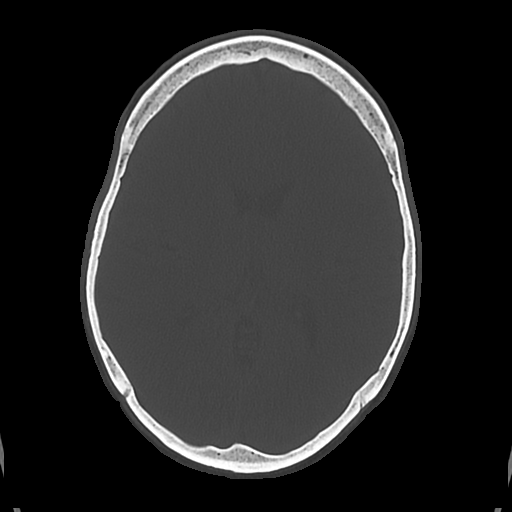

[Series 4: cor soft · coronal · 0.33mm/px · 3 of 66 slices shown]
[im 22/66  brain]
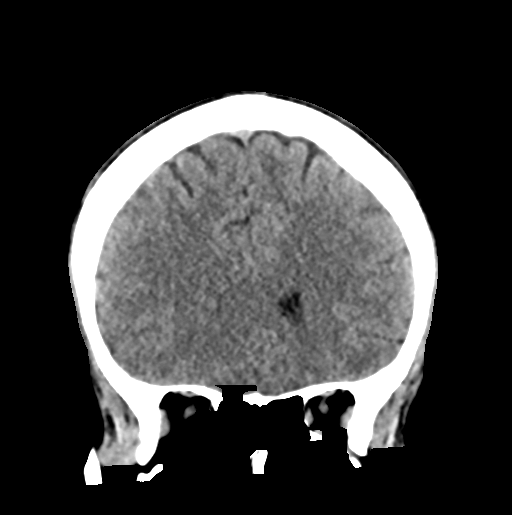
[im 29/66  brain]
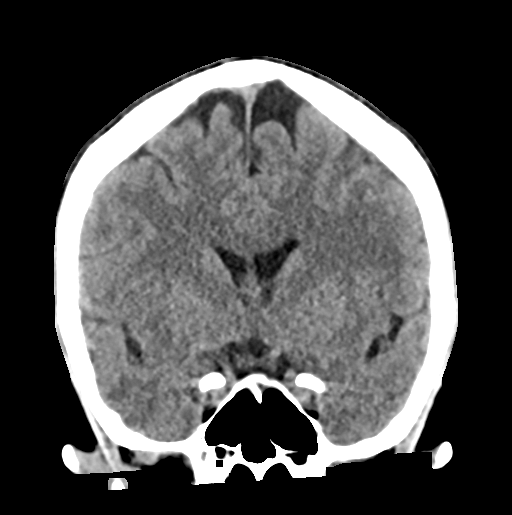
[im 37/66  brain]
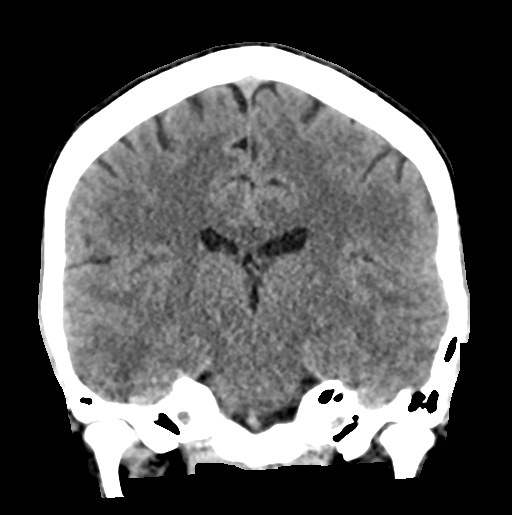

[Series 5: sag soft · sagittal · 0.33mm/px · 3 of 56 slices shown]
[im 19/56  brain]
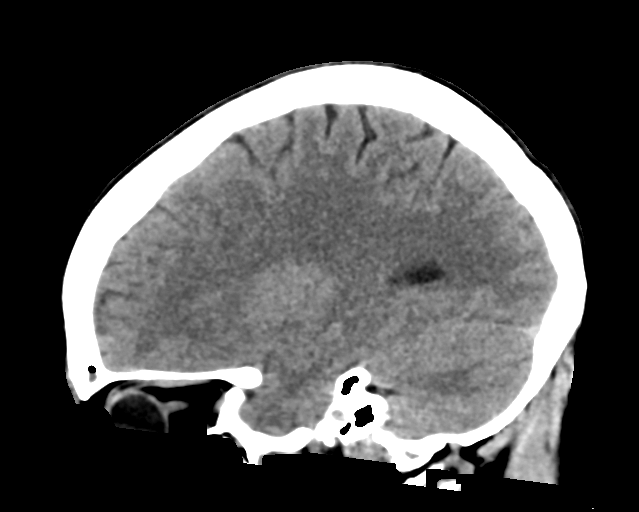
[im 28/56  brain]
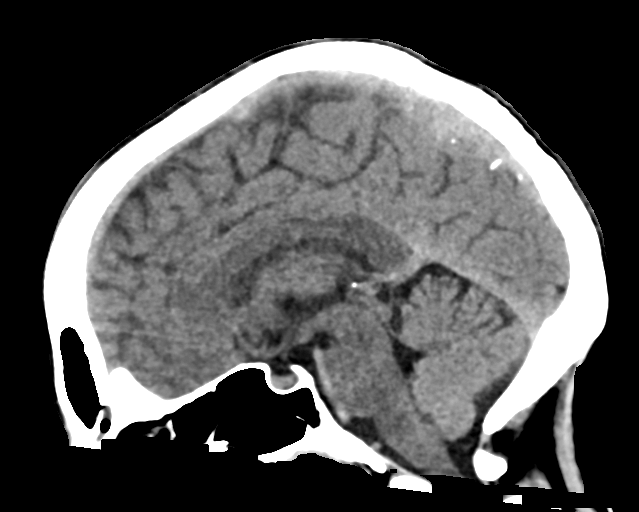
[im 37/56  brain]
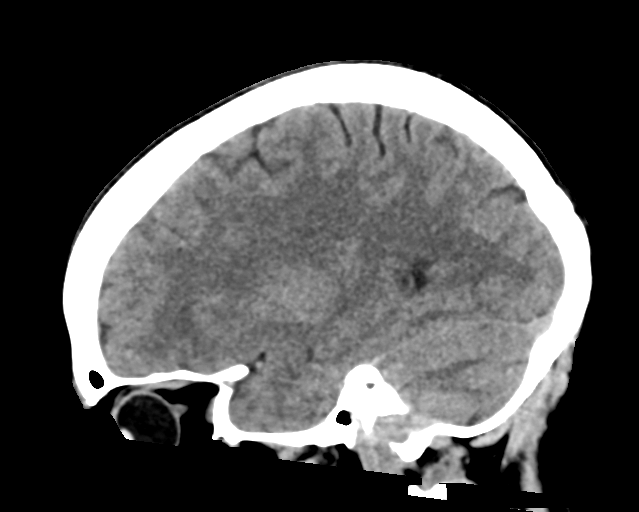

[17 of 47 positions shown; findings below may reference images not displayed]

FINDINGS: Brain: The brain shows a normal appearance without evidence of
malformation, atrophy, old or acute small or large vessel
infarction, mass lesion, hemorrhage, hydrocephalus or extra-axial
collection.

Vascular: No hyperdense vessel. No evidence of atherosclerotic
calcification.

Skull: Normal.  No traumatic finding.  No focal bone lesion.

Sinuses/Orbits: Sinuses are clear. Orbits appear normal. Mastoids
are clear.

Other: None significant
IMPRESSION: Normal head CT

## 2022-09-10 ENCOUNTER — Ambulatory Visit: Admit: 2022-09-10 | Payer: PRIVATE HEALTH INSURANCE

## 2022-10-09 ENCOUNTER — Other Ambulatory Visit: Payer: Self-pay

## 2022-10-09 ENCOUNTER — Emergency Department
Admission: EM | Admit: 2022-10-09 | Discharge: 2022-10-09 | Disposition: A | Discharge status: Home / Self Care | Payer: MEDICAID | Attending: Emergency Medicine | Admitting: Emergency Medicine

## 2022-10-09 ENCOUNTER — Encounter: Payer: Self-pay | Admitting: *Deleted

## 2022-10-09 DIAGNOSIS — Z1152 Encounter for screening for COVID-19: Secondary | ICD-10-CM | POA: Insufficient documentation

## 2022-10-09 DIAGNOSIS — Z20822 Contact with and (suspected) exposure to covid-19: Secondary | ICD-10-CM | POA: Diagnosis not present

## 2022-10-09 DIAGNOSIS — Z5321 Procedure and treatment not carried out due to patient leaving prior to being seen by health care provider: Secondary | ICD-10-CM | POA: Insufficient documentation

## 2022-10-09 LAB — SARS CORONAVIRUS 2 BY RT PCR: SARS Coronavirus 2 by RT PCR: NEGATIVE

## 2022-10-09 NOTE — ED Notes (Signed)
Patient's family member came to this nurse outside room 46 and states that he and his family "cannot wait any longer for their Covid results." RN informed them that she would grab a provider to come speak with the patient and his family. Patient and family continued to exit the ER. Providers Marlane Mingle and Crane PA informed of patient's departure.

## 2022-10-09 NOTE — ED Triage Notes (Signed)
Pt denies symptoms, only "here to get tested for COVID"

## 2023-07-06 ENCOUNTER — Encounter: Payer: Self-pay | Admitting: Physician Assistant

## 2023-07-06 ENCOUNTER — Ambulatory Visit: Payer: MEDICAID | Admitting: Physician Assistant

## 2023-07-06 VITALS — BP 117/87 | HR 62 | Resp 16 | Ht 61.0 in | Wt 141.0 lb

## 2023-07-06 DIAGNOSIS — Z136 Encounter for screening for cardiovascular disorders: Secondary | ICD-10-CM

## 2023-07-06 DIAGNOSIS — B182 Chronic viral hepatitis C: Secondary | ICD-10-CM | POA: Diagnosis not present

## 2023-07-06 DIAGNOSIS — F339 Major depressive disorder, recurrent, unspecified: Secondary | ICD-10-CM

## 2023-07-06 DIAGNOSIS — N912 Amenorrhea, unspecified: Secondary | ICD-10-CM | POA: Diagnosis not present

## 2023-07-06 DIAGNOSIS — F419 Anxiety disorder, unspecified: Secondary | ICD-10-CM

## 2023-07-06 DIAGNOSIS — Z7689 Persons encountering health services in other specified circumstances: Secondary | ICD-10-CM

## 2023-07-06 DIAGNOSIS — F17290 Nicotine dependence, other tobacco product, uncomplicated: Secondary | ICD-10-CM

## 2023-07-06 DIAGNOSIS — F410 Panic disorder [episodic paroxysmal anxiety] without agoraphobia: Secondary | ICD-10-CM

## 2023-07-06 DIAGNOSIS — F1191 Opioid use, unspecified, in remission: Secondary | ICD-10-CM

## 2023-07-06 NOTE — Progress Notes (Unsigned)
 New patient visit  Patient: Annette Roberts   DOB: 06-20-1985   38 y.o. Female  MRN: [id number] Visit Date: 07/06/2023  Today's healthcare provider: Blane Bunting, PA-C   Chief Complaint  Patient presents with  . New Patient (Initial Visit)    Hep C was positive 2 years ago. Patient would like to get treated.   Subjective    Annette Roberts is a 38 y.o. female who presents today as a new patient to establish care.   Discussed the use of AI scribe software for clinical note transcription with the patient, who gave verbal consent to proceed.  History of Present Illness Annette Roberts is a 38 year old female with hepatitis C who presents for re-evaluation and management of her condition.  She was diagnosed with hepatitis C two years ago and previously discontinued treatment due to an interaction with Trileptal. She is interested in resuming treatment. Her menstrual periods have been absent for over a year and a half. She is not on birth control and denies pregnancy. Current medications include Invega, trazodone, lorazepam , and Suboxone . She no longer takes Trileptal. She denies smoking or marijuana use but vapes tobacco, with a vape lasting about three weeks. She has a history of anemia and blood pressure issues. No chest pain, shortness of breath, or other systemic symptoms are present.     07/06/2023    2:51 PM  PHQ9 SCORE ONLY  PHQ-9 Total Score 3      07/06/2023    2:53 PM  GAD 7 : Generalized Anxiety Score  Nervous, Anxious, on Edge 1  Control/stop worrying 1  Worry too much - different things 1  Trouble relaxing 1  Restless 1  Easily annoyed or irritable 0  Afraid - awful might happen 0  Total GAD 7 Score 5  Anxiety Difficulty Somewhat difficult      Past Medical History:  Diagnosis Date  . Anxiety   . Current smoker   . Depression    does not want anyone incl  FOB to know  . History of preterm premature rupture of membranes (PPROM)    G2  . Low grade squamous  intraepithelial lesion (LGSIL) on cervical Pap smear 2017  . Marijuana use   . Methadone  maintenance treatment affecting pregnancy (HCC)   . Scoliosis    Past Surgical History:  Procedure Laterality Date  . CESAREAN SECTION  09/10/2009   FTP/FITL, meconium  . CESAREAN SECTION N/A 05/22/2015   Procedure: CESAREAN SECTION;  Surgeon: Kris Pester, MD;  Location: [facility] ORS;  Service: Obstetrics;  Laterality: N/A;  . CESAREAN SECTION N/A 11/29/2018   Procedure: CESAREAN SECTION;  Surgeon: Kris Pester, MD;  Location: [facility] ORS;  Service: Obstetrics;  Laterality: N/A;   Family Status  Relation Name Status  . Father  (Not Specified)  . Mat Aunt  (Not Specified)  . Mat Uncle  (Not Specified)  . MGM  (Not Specified)  . MGF  (Not Specified)  . PGM  (Not Specified)  . Nephew  (Not Specified)  No partnership data on file   Family History  Problem Relation Age of Onset  . Diabetes Father   . Hypertension Father   . Heart attack Maternal Aunt   . Heart attack Maternal Uncle   . Cancer Maternal Grandmother        Colon  . Heart attack Maternal Grandfather   . Cancer Paternal Grandmother   . Diabetes Nephew    Social History  Socioeconomic History  . Marital status: Single    Spouse name: Not on file  . Number of children: Not on file  . Years of education: Not on file  . Highest education level: Not on file  Occupational History  . Not on file  Tobacco Use  . Smoking status: Former    Current packs/day: 0.50    Average packs/day: 0.5 packs/day for 15.0 years (7.5 ttl pk-yrs)    Types: Cigarettes  . Smokeless tobacco: Never  Vaping Use  . Vaping status: Every Day  Substance and Sexual Activity  . Alcohol use: No    Alcohol/week: 0.0 standard drinks of alcohol  . Drug use: Not Currently    Types: Marijuana  . Sexual activity: Yes    Birth control/protection: Implant  Other Topics Concern  . Not on file  Social History Narrative  . Not on file   Social Drivers  of Health   Financial Resource Strain: Low Risk  (12/11/2021)   Received [facility], [facility]   Overall Financial Resource Strain (CARDIA)   . Difficulty of Paying Living Expenses: Not very hard  Food Insecurity: No Food Insecurity (01/29/2021)   Received from [facility], [facility]   Hunger Vital Sign   . Worried About Programme researcher, broadcasting/film/video in the Last Year: Never true   . Ran Out of Food in the Last Year: Never true  Transportation Needs: No Transportation Needs (01/29/2021)   Received from [facility], [facility]   [facility] - Transportation   . Lack of Transportation (Medical): No   . Lack of Transportation (Non-Medical): No  Physical Activity: Inactive (02/05/2022)   Received from [facility], [facility]   Exercise Vital Sign   . Days of Exercise per Week: 0 days   . Minutes of Exercise per Session: 0 min  Stress: Stress Concern Present (02/05/2022)   Received from [facility], [facility]   [facility] - Occupational Stress Questionnaire   . Feeling of Stress : To some extent  Social Connections: Not on file   Outpatient Medications Prior to Visit  Medication Sig  . Buprenorphine  HCl-Naloxone  HCl (SUBOXONE ) 8-2 MG FILM Place 1 Film under the tongue in the morning and at bedtime.  . INVEGA SUSTENNA 234 MG/1.5ML injection SMARTSIG:1.5 Milliliter(s) IM Once a Month  . LORazepam  (ATIVAN ) 1 MG tablet Take 1 mg by mouth 3 (three) times daily.  . traZODone (DESYREL) 100 MG tablet Take 100 mg by mouth at bedtime.  . clotrimazole  (MYCELEX ) 10 MG troche Take 1 tablet (10 mg total) by mouth 5 (five) times daily. (Patient not taking: Reported on 12/28/2021)  . cyanocobalamin (VITAMIN B12) 1000 MCG tablet Take 1 tablet by mouth daily. (Patient not taking: Reported on 12/28/2021)  . hydrOXYzine  (ATARAX ) 25 MG tablet Take 25 mg by mouth every 8 (eight) hours as needed.  . ondansetron  (ZOFRAN -ODT) 4 MG disintegrating  tablet Take 1 tablet (4 mg total) by mouth every 8 (eight) hours as needed for nausea or vomiting.  . Oxcarbazepine (TRILEPTAL) 300 MG tablet Take 300 mg by mouth daily. (Patient not taking: Reported on 12/28/2021)   No facility-administered medications prior to visit.   No Known Allergies  Immunization History  Administered Date(s) Administered  . Influenza,inj,Quad PF,6+ Mos 04/27/2018  . Tdap 10/14/2018    Health Maintenance  Topic Date Due  . COVID-19 Vaccine (1 - 2024-25 season) Never done  .  Cervical Cancer Screening (HPV/Pap Cotest)  04/26/2023  . INFLUENZA VACCINE  09/24/2023  . DTaP/Tdap/Td (2 - Td or Tdap) 10/13/2028  . Hepatitis C Screening  Completed  . HIV Screening  Completed  . HPV VACCINES  Aged Out  . Meningococcal B Vaccine  Aged Out    Patient Care Team: Noa Constante, PA-C as PCP - General (Physician Assistant)  Review of Systems  All other systems reviewed and are negative.  Except see HPI   {Insert previous labs (optional):23779} {See past labs  Heme  Chem  Endocrine  Serology  Results Review (optional):1}   Objective    BP 117/87 (BP Location: Left Arm, Patient Position: Sitting, Cuff Size: Normal)   Pulse 62   Resp 16   Ht 5\' 1"  (1.549 m)   Wt 141 lb (64 kg)   LMP  (Within Years)   SpO2 98%   BMI 26.64 kg/m  {Insert last BP/Wt (optional):23777}{See vitals history (optional):1}   Physical Exam Vitals reviewed.  Constitutional:      General: She is not in acute distress.    Appearance: Normal appearance. She is well-developed. She is not diaphoretic.  HENT:     Head: Normocephalic and atraumatic.  Eyes:     General: No scleral icterus.    Conjunctiva/sclera: Conjunctivae normal.  Neck:     Thyroid : No thyromegaly.  Cardiovascular:     Rate and Rhythm: Normal rate and regular rhythm.     Pulses: Normal pulses.     Heart sounds: Normal heart sounds. No murmur heard. Pulmonary:     Effort: Pulmonary effort is normal. No  respiratory distress.     Breath sounds: Normal breath sounds. No wheezing, rhonchi or rales.  Musculoskeletal:     Cervical back: Neck supple.     Right lower leg: No edema.     Left lower leg: No edema.  Lymphadenopathy:     Cervical: No cervical adenopathy.  Skin:    General: Skin is warm and dry.     Findings: No rash.  Neurological:     Mental Status: She is alert and oriented to person, place, and time. Mental status is at baseline.  Psychiatric:        Mood and Affect: Mood normal.        Behavior: Behavior normal.    Depression Screen    07/06/2023    2:51 PM  PHQ 2/9 Scores  PHQ - 2 Score 0  PHQ- 9 Score 3   Results for orders placed or performed in visit on 07/06/23  HM HEPATITIS C SCREENING LAB  Result Value Ref Range   HM Hepatitis Screen Positive- Validated     Assessment & Plan      Assessment & Plan Hepatitis C  Previous treatment incomplete due to Trileptal interaction. Current status needs confirmation. - Order hepatitis C lab work to confirm current status. - Refer to infectious disease or gastroenterology for management if confirmed.  Amenorrhea Amenorrhea for over 18 months. Differential includes hormonal imbalances or other gynecological issues. - Refer to OBGYN for evaluation and possible imaging.  Depression and anxiety Chronic Well-managed with Invega, trazodone, lorazepam , and Suboxone  by psychiatrist Dr. Jolena Nay. Requested his OV notes Will follow-up  Nicotine dependence, vaping Nicotine dependence through vaping for over two years.  Encounter to establish care Welcomed to our clinic Reviewed past medical hx, social hx, family hx and surgical hx Pt advised to send all vaccination records or screening  Hepatitis C virus infection without hepatic  coma, unspecified chronicity - Hepatitis C Antibody - HCV RNA Diagnosis, NAA  Amenorrhea - Ambulatory referral to Gynecology  Encounter for special screening examination for  cardiovascular disorder - Lipid panel - CBC w/Diff/Platelet - Comprehensive Metabolic Panel (CMET)  Vaping nicotine dependence, tobacco product Advised cessation Seems reluctant Will follow-up   No follow-ups on file.    The patient was advised to call back or seek an in-person evaluation if the symptoms worsen or if the condition fails to improve as anticipated.  I discussed the assessment and treatment plan with the patient. The patient was provided an opportunity to ask questions and all were answered. The patient agreed with the plan and demonstrated an understanding of the instructions.  I, Rebekka Lobello, PA-C have reviewed all documentation for this visit. The documentation on  07/06/2023   for the exam, diagnosis, procedures, and orders are all accurate and complete.  Blane Bunting, [facility], MMS [facility] [phone] (phone) [phone] (fax)  [facility]

## 2023-07-06 NOTE — Progress Notes (Incomplete)
 New patient visit  Patient: Annette Roberts   DOB: 02/23/1986   38 y.o. Female  MRN: [id number] Visit Date: 07/06/2023  Today's healthcare provider: Blane Bunting, PA-C   Chief Complaint  Patient presents with  . New Patient (Initial Visit)    Hep C was positive 2 years ago. Patient would like to get treated.   Subjective    Annette Roberts is a 38 y.o. female who presents today as a new patient to establish care.   Discussed the use of AI scribe software for clinical note transcription with the patient, who gave verbal consent to proceed.  History of Present Illness Annette Roberts is a 38 year old female with hepatitis C who presents for re-evaluation and management of her condition.  She was diagnosed with hepatitis C two years ago and previously discontinued treatment due to an interaction with Trileptal. She is interested in resuming treatment. Her menstrual periods have been absent for over a year and a half. She is not on birth control and denies pregnancy. Current medications include Invega, trazodone, lorazepam , and Suboxone . She no longer takes Trileptal. She denies smoking or marijuana use but vapes tobacco, with a vape lasting about three weeks. She has a history of anemia and blood pressure issues. No chest pain, shortness of breath, or other systemic symptoms are present.     07/06/2023    2:51 PM  PHQ9 SCORE ONLY  PHQ-9 Total Score 3      07/06/2023    2:53 PM  GAD 7 : Generalized Anxiety Score  Nervous, Anxious, on Edge 1  Control/stop worrying 1  Worry too much - different things 1  Trouble relaxing 1  Restless 1  Easily annoyed or irritable 0  Afraid - awful might happen 0  Total GAD 7 Score 5  Anxiety Difficulty Somewhat difficult      Past Medical History:  Diagnosis Date  . Anxiety   . Current smoker   . Depression    does not want anyone incl  FOB to know  . History of preterm premature rupture of membranes (PPROM)    G2  . Low grade squamous  intraepithelial lesion (LGSIL) on cervical Pap smear 2017  . Marijuana use   . Methadone  maintenance treatment affecting pregnancy (HCC)   . Scoliosis    Past Surgical History:  Procedure Laterality Date  . CESAREAN SECTION  09/10/2009   FTP/FITL, meconium  . CESAREAN SECTION N/A 05/22/2015   Procedure: CESAREAN SECTION;  Surgeon: Kris Pester, MD;  Location: [facility] ORS;  Service: Obstetrics;  Laterality: N/A;  . CESAREAN SECTION N/A 11/29/2018   Procedure: CESAREAN SECTION;  Surgeon: Kris Pester, MD;  Location: [facility] ORS;  Service: Obstetrics;  Laterality: N/A;   Family Status  Relation Name Status  . Father  (Not Specified)  . Mat Aunt  (Not Specified)  . Mat Uncle  (Not Specified)  . MGM  (Not Specified)  . MGF  (Not Specified)  . PGM  (Not Specified)  . Nephew  (Not Specified)  No partnership data on file   Family History  Problem Relation Age of Onset  . Diabetes Father   . Hypertension Father   . Heart attack Maternal Aunt   . Heart attack Maternal Uncle   . Cancer Maternal Grandmother        Colon  . Heart attack Maternal Grandfather   . Cancer Paternal Grandmother   . Diabetes Nephew    Social History  Socioeconomic History  . Marital status: Single    Spouse name: Not on file  . Number of children: Not on file  . Years of education: Not on file  . Highest education level: Not on file  Occupational History  . Not on file  Tobacco Use  . Smoking status: Former    Current packs/day: 0.50    Average packs/day: 0.5 packs/day for 15.0 years (7.5 ttl pk-yrs)    Types: Cigarettes  . Smokeless tobacco: Never  Vaping Use  . Vaping status: Every Day  Substance and Sexual Activity  . Alcohol use: No    Alcohol/week: 0.0 standard drinks of alcohol  . Drug use: Not Currently    Types: Marijuana  . Sexual activity: Yes    Birth control/protection: Implant  Other Topics Concern  . Not on file  Social History Narrative  . Not on file   Social Drivers  of Health   Financial Resource Strain: Low Risk  (12/11/2021)   Received from [facility], [facility]   Overall Financial Resource Strain (CARDIA)   . Difficulty of Paying Living Expenses: Not very hard  Food Insecurity: No Food Insecurity (01/29/2021)   Received from [facility], [facility]   Hunger Vital Sign   . Worried About Programme researcher, broadcasting/film/video in the Last Year: Never true   . Ran Out of Food in the Last Year: Never true  Transportation Needs: No Transportation Needs (01/29/2021)   Received from [facility], [facility]   [facility] - Transportation   . Lack of Transportation (Medical): No   . Lack of Transportation (Non-Medical): No  Physical Activity: Inactive (02/05/2022)   Received from [facility], [facility]   Exercise Vital Sign   . Days of Exercise per Week: 0 days   . Minutes of Exercise per Session: 0 min  Stress: Stress Concern Present (02/05/2022)   Received from [facility], [facility]   [facility] - Occupational Stress Questionnaire   . Feeling of Stress : To some extent  Social Connections: Not on file   Outpatient Medications Prior to Visit  Medication Sig  . Buprenorphine  HCl-Naloxone  HCl (SUBOXONE ) 8-2 MG FILM Place 1 Film under the tongue in the morning and at bedtime.  . INVEGA SUSTENNA 234 MG/1.5ML injection SMARTSIG:1.5 Milliliter(s) IM Once a Month  . LORazepam  (ATIVAN ) 1 MG tablet Take 1 mg by mouth 3 (three) times daily.  . traZODone (DESYREL) 100 MG tablet Take 100 mg by mouth at bedtime.  . clotrimazole  (MYCELEX ) 10 MG troche Take 1 tablet (10 mg total) by mouth 5 (five) times daily. (Patient not taking: Reported on 12/28/2021)  . cyanocobalamin (VITAMIN B12) 1000 MCG tablet Take 1 tablet by mouth daily. (Patient not taking: Reported on 12/28/2021)  . hydrOXYzine  (ATARAX ) 25 MG tablet Take 25 mg by mouth every 8 (eight) hours as needed.  . ondansetron  (ZOFRAN -ODT) 4 MG disintegrating  tablet Take 1 tablet (4 mg total) by mouth every 8 (eight) hours as needed for nausea or vomiting.  . Oxcarbazepine (TRILEPTAL) 300 MG tablet Take 300 mg by mouth daily. (Patient not taking: Reported on 12/28/2021)   No facility-administered medications prior to visit.   No Known Allergies  Immunization History  Administered Date(s) Administered  . Influenza,inj,Quad PF,6+ Mos 04/27/2018  . Tdap 10/14/2018    Health Maintenance  Topic Date Due  . COVID-19 Vaccine (1 - 2024-25 season) Never done  .  Cervical Cancer Screening (HPV/Pap Cotest)  04/26/2023  . INFLUENZA VACCINE  09/24/2023  . DTaP/Tdap/Td (2 - Td or Tdap) 10/13/2028  . Hepatitis C Screening  Completed  . HIV Screening  Completed  . HPV VACCINES  Aged Out  . Meningococcal B Vaccine  Aged Out    Patient Care Team: Saree Krogh, PA-C as PCP - General (Physician Assistant)  Review of Systems  All other systems reviewed and are negative.  Except see HPI   {Insert previous labs (optional):23779} {See past labs  Heme  Chem  Endocrine  Serology  Results Review (optional):1}   Objective    BP 117/87 (BP Location: Left Arm, Patient Position: Sitting, Cuff Size: Normal)   Pulse 62   Resp 16   Ht 5\' 1"  (1.549 m)   Wt 141 lb (64 kg)   LMP  (Within Years)   SpO2 98%   BMI 26.64 kg/m  {Insert last BP/Wt (optional):23777}{See vitals history (optional):1}   Physical Exam Vitals reviewed.  Constitutional:      General: She is not in acute distress.    Appearance: Normal appearance. She is well-developed. She is not diaphoretic.  HENT:     Head: Normocephalic and atraumatic.  Eyes:     General: No scleral icterus.    Conjunctiva/sclera: Conjunctivae normal.  Neck:     Thyroid : No thyromegaly.  Cardiovascular:     Rate and Rhythm: Normal rate and regular rhythm.     Pulses: Normal pulses.     Heart sounds: Normal heart sounds. No murmur heard. Pulmonary:     Effort: Pulmonary effort is normal. No  respiratory distress.     Breath sounds: Normal breath sounds. No wheezing, rhonchi or rales.  Musculoskeletal:     Cervical back: Neck supple.     Right lower leg: No edema.     Left lower leg: No edema.  Lymphadenopathy:     Cervical: No cervical adenopathy.  Skin:    General: Skin is warm and dry.     Findings: No rash.  Neurological:     Mental Status: She is alert and oriented to person, place, and time. Mental status is at baseline.  Psychiatric:        Mood and Affect: Mood normal.        Behavior: Behavior normal.    Depression Screen    07/06/2023    2:51 PM  PHQ 2/9 Scores  PHQ - 2 Score 0  PHQ- 9 Score 3   Results for orders placed or performed in visit on 07/06/23  HM HEPATITIS C SCREENING LAB  Result Value Ref Range   HM Hepatitis Screen Positive- Validated     Assessment & Plan      Assessment & Plan Hepatitis C  Previous treatment incomplete due to Trileptal interaction. Pt took it for insomnia. Was on gabapentin while taking Mavyret. Current status needs confirmation. Vaccination for hep A and B was completed. - Order hepatitis C lab work to confirm current status. - Refer to infectious disease or gastroenterology for management if confirmed. Will do labs prior to treatment/06/05/23 Amenorrhea Amenorrhea for over 18 months. Differential includes hormonal imbalances or other gynecological issues. - Refer to OBGYN for evaluation and possible imaging.  Depression and anxiety Chronic Well-managed with Invega, trazodone, lorazepam  0.5mg  bid, and Suboxone  by psychiatrist Dr. Jolena Nay. 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 Jolena Nay  Nicotine dependence, vaping Nicotine dependence through vaping for over  two years.  Encounter to establish care Welcomed to our clinic Reviewed past medical hx, social hx, family hx and surgical hx Pt advised to send all vaccination records or  screening  Hepatitis C virus infection without hepatic coma, unspecified chronicity - Hepatitis C Antibody - HCV RNA Diagnosis, NAA  Amenorrhea - Ambulatory referral to Gynecology  Encounter for special screening examination for cardiovascular disorder - Lipid panel - CBC w/Diff/Platelet - Comprehensive Metabolic Panel (CMET)  Vaping nicotine dependence, tobacco product Advised cessation Seems reluctant Will follow-up  [employer] 24 hour Crisis Line:[phone] [employer] Served: [place], [place], [place], [place], [place], [place], [place], [place], [place], [place], [place], [place], [place], [place], [place] and [place]   No follow-ups on file.    The patient was advised to call back or seek an in-person evaluation if the symptoms worsen or if the condition fails to improve as anticipated.  I discussed the assessment and treatment plan with the patient. The patient was provided an opportunity to ask questions and all were answered. The patient agreed with the plan and demonstrated an understanding of the instructions.  I, Mauriah Mcmillen, PA-C have reviewed all documentation for this visit. The documentation on  07/06/2023   for the exam, diagnosis, procedures, and orders are all accurate and complete.  Blane Bunting, [facility], MMS [facility] [phone] (phone) [phone] (fax)  [facility]

## 2023-07-07 LAB — HCV RNA DIAGNOSIS, NAA

## 2023-07-08 LAB — COMPREHENSIVE METABOLIC PANEL WITH GFR
ALT: 9 IU/L (ref 0–32)
AST: 13 IU/L (ref 0–40)
Albumin: 4.8 g/dL (ref 3.9–4.9)
Alkaline Phosphatase: 83 IU/L (ref 44–121)
BUN/Creatinine Ratio: 4 — ABNORMAL LOW (ref 9–23)
BUN: 3 mg/dL — ABNORMAL LOW (ref 6–20)
Bilirubin Total: 0.3 mg/dL (ref 0.0–1.2)
CO2: 19 mmol/L — ABNORMAL LOW (ref 20–29)
Calcium: 9.3 mg/dL (ref 8.7–10.2)
Chloride: 105 mmol/L (ref 96–106)
Creatinine, Ser: 0.69 mg/dL (ref 0.57–1.00)
Globulin, Total: 2.2 g/dL (ref 1.5–4.5)
Glucose: 97 mg/dL (ref 70–99)
Potassium: 4.1 mmol/L (ref 3.5–5.2)
Sodium: 143 mmol/L (ref 134–144)
Total Protein: 7 g/dL (ref 6.0–8.5)
eGFR: 116 mL/min/{1.73_m2} (ref >59)

## 2023-07-08 LAB — CBC WITH DIFFERENTIAL/PLATELET
Basophils Absolute: 0 10*3/uL (ref 0.0–0.2)
Basos: 1 %
EOS (ABSOLUTE): 0.1 10*3/uL (ref 0.0–0.4)
Eos: 2 %
Hematocrit: 40.4 % (ref 34.0–46.6)
Hemoglobin: 13.4 g/dL (ref 11.1–15.9)
Immature Grans (Abs): 0 10*3/uL (ref 0.0–0.1)
Immature Granulocytes: 0 %
Lymphocytes Absolute: 1 10*3/uL (ref 0.7–3.1)
Lymphs: 21 %
MCH: 28.5 pg (ref 26.6–33.0)
MCHC: 33.2 g/dL (ref 31.5–35.7)
MCV: 86 fL (ref 79–97)
Monocytes Absolute: 0.4 10*3/uL (ref 0.1–0.9)
Monocytes: 7 %
Neutrophils Absolute: 3.2 10*3/uL (ref 1.4–7.0)
Neutrophils: 69 %
Platelets: 195 10*3/uL (ref 150–450)
RBC: 4.71 x10E6/uL (ref 3.77–5.28)
RDW: 13.2 % (ref 11.7–15.4)
WBC: 4.7 10*3/uL (ref 3.4–10.8)

## 2023-07-08 LAB — LIPID PANEL
Chol/HDL Ratio: 4.7 ratio — ABNORMAL HIGH (ref 0.0–4.4)
Cholesterol, Total: 175 mg/dL (ref 100–199)
HDL: 37 mg/dL — ABNORMAL LOW (ref >39)
LDL Chol Calc (NIH): 111 mg/dL — ABNORMAL HIGH (ref 0–99)
Triglycerides: 152 mg/dL — ABNORMAL HIGH (ref 0–149)
VLDL Cholesterol Cal: 27 mg/dL (ref 5–40)

## 2023-07-08 LAB — HCV RNA DIAGNOSIS, NAA

## 2023-07-08 LAB — HEPATITIS C ANTIBODY: Hep C Virus Ab: REACTIVE — AB

## 2023-07-12 ENCOUNTER — Ambulatory Visit: Payer: Self-pay | Admitting: Physician Assistant

## 2023-08-16 NOTE — Progress Notes (Unsigned)
 Ostwalt, Janna, PA-C   No chief complaint on file.   HPI:      Ms. Annette Roberts is a 38 y.o. [phone] whose LMP was No LMP recorded., presents today for NP eval amenorrhea for 18 months, referred by PCP    Patient Active Problem List   Diagnosis Date Noted   Suicidal ideation 12/28/2021   Auditory hallucinations 12/28/2021   Pruritus of scalp    Hyponatremia    Drug reaction 07/03/2020   History of opioid abuse - on suboxone  (HCC) 07/03/2020   Acute toxic-metabolic encephalopathy 07/03/2020   Delivery by cesarean section at 37-39 weeks of gestation due to labor 12/01/2018   Status post repeat low transverse cesarean section 11/29/2018   Indication for care in labor and delivery, antepartum 10/28/2018   Pregnancy related nausea and vomiting, antepartum 06/30/2018   History of preterm delivery, currently pregnant 05/25/2018   History of cesarean delivery, currently pregnant 05/25/2018   History of substance use 04/27/2018   Supervision of high risk pregnancy, antepartum 04/26/2018   Hilar lymphadenopathy 05/26/2015   Tobacco dependence 05/26/2015   Postpartum care following cesarean delivery 05/22/2015   Low grade squamous intraepithelial lesion (LGSIL) on cervical Pap smear 2017    Past Surgical History:  Procedure Laterality Date   CESAREAN SECTION  09/10/2009   FTP/FITL, meconium   CESAREAN SECTION N/A 05/22/2015   Procedure: CESAREAN SECTION;  Surgeon: Garnette JONETTA Mace, MD;  Location: [facility] ORS;  Service: Obstetrics;  Laterality: N/A;   CESAREAN SECTION N/A 11/29/2018   Procedure: CESAREAN SECTION;  Surgeon: Mace Garnette JONETTA, MD;  Location: [facility] OR[facility];  Service: Obstetrics;  Laterality: N/A;    Family History  Problem Relation Age of Onset   Diabetes Father    Hypertension Father    Heart attack Maternal Aunt    Heart attack Maternal Uncle    Cancer Maternal Grandmother        Colon   Heart attack Maternal Grandfather    Cancer Paternal Grandmother     Diabetes Nephew     Social History   Socioeconomic History   Marital status: Single    Spouse name: Not on file   Number of children: Not on file   Years of education: Not on file   Highest education level: Not on file  Occupational History   Not on file  Tobacco Use   Smoking status: Former    Current packs/day: 0.50    Average packs/day: 0.5 packs/day for 15.0 years (7.5 ttl pk-yrs)    Types: Cigarettes   Smokeless tobacco: Never  Vaping Use   Vaping status: Every Day  Substance and Sexual Activity   Alcohol use: No    Alcohol/week: 0.0 standard drinks of alcohol   Drug use: Not Currently    Types: Marijuana   Sexual activity: Yes    Birth control/protection: Implant  Other Topics Concern   Not on file  Social History Narrative   Not on file   Social Drivers of Health   Financial Resource Strain: Low Risk  (12/11/2021)   Received from [facility]   Overall Financial Resource Strain (CARDIA)    Difficulty of Paying Living Expenses: Not very hard  Food Insecurity: No Food Insecurity (01/29/2021)   Received from [facility]   Hunger Vital Sign    Worried About Running Out of Food in the Last Year: Never true    Ran Out of Food in the Last Year: Never true  Transportation  Needs: No Transportation Needs (01/29/2021)   Received from [facility] - Transportation    Lack of Transportation (Medical): No    Lack of Transportation (Non-Medical): No  Physical Activity: Inactive (02/05/2022)   Received from [facility]   Exercise Vital Sign    Days of Exercise per Week: 0 days    Minutes of Exercise per Session: 0 min  Stress: Stress Concern Present (02/05/2022)   Received from [facility] - Occupational Stress Questionnaire    Feeling of Stress : To some extent  Social Connections: Not on file  Intimate Partner Violence: Not At Risk (08/06/2021)   Received from [facility]   Humiliation,  Afraid, Rape, and Kick questionnaire    Fear of Current or Ex-Partner: No    Emotionally Abused: No    Physically Abused: No    Sexually Abused: No    Outpatient Medications Prior to Visit  Medication Sig Dispense Refill   Buprenorphine  HCl-Naloxone  HCl (SUBOXONE ) 8-2 MG FILM Place 1 Film under the tongue in the morning and at bedtime. 30 each    INVEGA SUSTENNA 234 MG/1.5ML injection SMARTSIG:1.5 Milliliter(s) IM Once a Month     LORazepam  (ATIVAN ) 1 MG tablet Take 1 mg by mouth 3 (three) times daily.     traZODone (DESYREL) 100 MG tablet Take 100 mg by mouth at bedtime.     No facility-administered medications prior to visit.      ROS:  Review of Systems BREAST: No symptoms   OBJECTIVE:   Vitals:  There were no vitals taken for this visit.  Physical Exam  Results: No results found for this or any previous visit (from the past 24 hours).   Assessment/Plan: No diagnosis found.    No orders of the defined types were placed in this encounter.     No follow-ups on file.  Oniel Meleski B. Yanice Maqueda, PA-C 08/16/2023 10:23 AM

## 2023-08-17 ENCOUNTER — Encounter: Payer: Self-pay | Admitting: Obstetrics and Gynecology

## 2023-08-17 ENCOUNTER — Other Ambulatory Visit (HOSPITAL_COMMUNITY)
Admission: RE | Admit: 2023-08-17 | Discharge: 2023-08-17 | Disposition: A | Discharge status: Home / Self Care | Payer: MEDICAID | Source: Ambulatory Visit | Attending: Obstetrics and Gynecology | Admitting: Obstetrics and Gynecology

## 2023-08-17 ENCOUNTER — Ambulatory Visit (INDEPENDENT_AMBULATORY_CARE_PROVIDER_SITE_OTHER): Payer: MEDICAID | Admitting: Obstetrics and Gynecology

## 2023-08-17 VITALS — BP 98/65 | HR 61 | Ht 61.0 in | Wt 138.0 lb

## 2023-08-17 DIAGNOSIS — Z113 Encounter for screening for infections with a predominantly sexual mode of transmission: Secondary | ICD-10-CM

## 2023-08-17 DIAGNOSIS — Z124 Encounter for screening for malignant neoplasm of cervix: Secondary | ICD-10-CM | POA: Diagnosis present

## 2023-08-17 DIAGNOSIS — Z1151 Encounter for screening for human papillomavirus (HPV): Secondary | ICD-10-CM | POA: Insufficient documentation

## 2023-08-17 DIAGNOSIS — Z3202 Encounter for pregnancy test, result negative: Secondary | ICD-10-CM

## 2023-08-17 DIAGNOSIS — Z1329 Encounter for screening for other suspected endocrine disorder: Secondary | ICD-10-CM

## 2023-08-17 DIAGNOSIS — N912 Amenorrhea, unspecified: Secondary | ICD-10-CM

## 2023-08-17 DIAGNOSIS — E221 Hyperprolactinemia: Secondary | ICD-10-CM | POA: Diagnosis not present

## 2023-08-17 LAB — POCT URINE PREGNANCY: Preg Test, Ur: NEGATIVE

## 2023-08-17 MED ORDER — MEDROXYPROGESTERONE ACETATE 10 MG PO TABS: 10.0000 mg | ORAL_TABLET | Freq: Every day | ORAL | 0 refills | Status: AC

## 2023-08-17 NOTE — Patient Instructions (Signed)
 I value your feedback and you entrusting [place] with your care. If you get a [place] patient survey, I would appreciate you taking the time to let us know about your experience today. Thank you! ? ? ?

## 2023-08-18 LAB — PROLACTIN: Prolactin: 209 ng/mL — ABNORMAL HIGH (ref 4.8–33.4)

## 2023-08-18 LAB — FSH/LH
FSH: 0.7 m[IU]/mL
LH: <0.3 m[IU]/mL

## 2023-08-18 LAB — TSH: TSH: 4.34 u[IU]/mL (ref 0.450–4.500)

## 2023-08-18 LAB — T4, FREE: Free T4: 0.94 ng/dL (ref 0.82–1.77)

## 2023-08-19 ENCOUNTER — Ambulatory Visit: Payer: Self-pay | Admitting: Obstetrics and Gynecology

## 2023-08-19 NOTE — Addendum Note (Signed)
 Addended by: WATT HILA B on: 08/19/2023 12:15 PM   Modules accepted: Orders

## 2023-08-25 LAB — CYTOLOGY - PAP
Chlamydia: NEGATIVE
Comment: NEGATIVE
Comment: NEGATIVE
Comment: NORMAL
Diagnosis: UNDETERMINED — AB
High risk HPV: NEGATIVE
Neisseria Gonorrhea: NEGATIVE
